# Patient Record
Sex: Male | Born: 1955
Health system: Southern US, Community
[De-identification: ages and names within clinical notes are randomized; demographics above are authoritative.]

## PROBLEM LIST (undated history)

## (undated) DIAGNOSIS — I2510 Atherosclerotic heart disease of native coronary artery without angina pectoris: Secondary | ICD-10-CM

## (undated) DIAGNOSIS — Z0000 Encounter for general adult medical examination without abnormal findings: Secondary | ICD-10-CM

## (undated) DIAGNOSIS — E785 Hyperlipidemia, unspecified: Secondary | ICD-10-CM

## (undated) HISTORY — PX: BACK SURGERY: SHX140

## (undated) HISTORY — DX: Encounter for general adult medical examination without abnormal findings: Z00.00

## (undated) HISTORY — PX: WRIST SURGERY: SHX841

## (undated) HISTORY — DX: Hyperlipidemia, unspecified: E78.5

## (undated) HISTORY — PX: COLONOSCOPY: SHX174

## (undated) HISTORY — DX: Atherosclerotic heart disease of native coronary artery without angina pectoris: I25.10

## (undated) HISTORY — PX: OTHER SURGICAL HISTORY: SHX169

---

## 1999-04-04 ENCOUNTER — Encounter: Admission: RE | Admit: 1999-04-04 | Discharge: 1999-04-04 | Payer: Self-pay | Admitting: Sports Medicine

## 1999-04-13 ENCOUNTER — Encounter: Admission: RE | Admit: 1999-04-13 | Discharge: 1999-04-13 | Payer: Self-pay | Admitting: Family Medicine

## 1999-08-04 ENCOUNTER — Encounter: Admission: RE | Admit: 1999-08-04 | Discharge: 1999-08-04 | Payer: Self-pay | Admitting: Sports Medicine

## 1999-09-08 ENCOUNTER — Encounter: Admission: RE | Admit: 1999-09-08 | Discharge: 1999-09-08 | Payer: Self-pay | Admitting: Family Medicine

## 1999-10-03 ENCOUNTER — Encounter: Admission: RE | Admit: 1999-10-03 | Discharge: 1999-10-03 | Payer: Self-pay | Admitting: Sports Medicine

## 2000-03-23 ENCOUNTER — Emergency Department (HOSPITAL_COMMUNITY): Admission: EM | Admit: 2000-03-23 | Discharge: 2000-03-23 | Payer: Self-pay | Admitting: Emergency Medicine

## 2000-03-26 ENCOUNTER — Encounter: Admission: RE | Admit: 2000-03-26 | Discharge: 2000-03-26 | Payer: Self-pay | Admitting: Family Medicine

## 2000-03-28 ENCOUNTER — Encounter: Admission: RE | Admit: 2000-03-28 | Discharge: 2000-03-28 | Payer: Self-pay | Admitting: Sports Medicine

## 2000-03-28 ENCOUNTER — Encounter: Payer: Self-pay | Admitting: Sports Medicine

## 2000-04-02 ENCOUNTER — Encounter: Admission: RE | Admit: 2000-04-02 | Discharge: 2000-04-02 | Payer: Self-pay | Admitting: Family Medicine

## 2000-04-30 ENCOUNTER — Encounter: Admission: RE | Admit: 2000-04-30 | Discharge: 2000-04-30 | Payer: Self-pay | Admitting: Family Medicine

## 2000-06-04 ENCOUNTER — Encounter: Admission: RE | Admit: 2000-06-04 | Discharge: 2000-06-04 | Payer: Self-pay | Admitting: Sports Medicine

## 2000-09-03 ENCOUNTER — Encounter: Admission: RE | Admit: 2000-09-03 | Discharge: 2000-09-03 | Payer: Self-pay | Admitting: Family Medicine

## 2001-07-16 ENCOUNTER — Encounter: Admission: RE | Admit: 2001-07-16 | Discharge: 2001-07-16 | Payer: Self-pay | Admitting: Sports Medicine

## 2001-08-06 ENCOUNTER — Encounter: Admission: RE | Admit: 2001-08-06 | Discharge: 2001-08-06 | Payer: Self-pay | Admitting: Family Medicine

## 2002-01-02 ENCOUNTER — Encounter: Admission: RE | Admit: 2002-01-02 | Discharge: 2002-01-02 | Payer: Self-pay | Admitting: Family Medicine

## 2003-10-27 ENCOUNTER — Encounter: Admission: RE | Admit: 2003-10-27 | Discharge: 2003-10-27 | Payer: Self-pay | Admitting: Sports Medicine

## 2003-11-13 ENCOUNTER — Encounter: Admission: RE | Admit: 2003-11-13 | Discharge: 2003-11-13 | Payer: Self-pay | Admitting: Sports Medicine

## 2003-12-02 ENCOUNTER — Encounter: Admission: RE | Admit: 2003-12-02 | Discharge: 2003-12-02 | Payer: Self-pay | Admitting: Family Medicine

## 2003-12-16 ENCOUNTER — Encounter: Admission: RE | Admit: 2003-12-16 | Discharge: 2003-12-16 | Payer: Self-pay | Admitting: Family Medicine

## 2003-12-21 ENCOUNTER — Encounter: Admission: RE | Admit: 2003-12-21 | Discharge: 2003-12-21 | Payer: Self-pay | Admitting: Sports Medicine

## 2003-12-25 ENCOUNTER — Encounter: Admission: RE | Admit: 2003-12-25 | Discharge: 2003-12-25 | Payer: Self-pay | Admitting: Family Medicine

## 2004-09-06 ENCOUNTER — Ambulatory Visit: Payer: Self-pay | Admitting: Internal Medicine

## 2004-09-22 ENCOUNTER — Ambulatory Visit: Payer: Self-pay | Admitting: Internal Medicine

## 2004-11-29 ENCOUNTER — Ambulatory Visit: Payer: Self-pay | Admitting: Sports Medicine

## 2005-05-02 ENCOUNTER — Ambulatory Visit: Payer: Self-pay | Admitting: Sports Medicine

## 2005-05-30 ENCOUNTER — Ambulatory Visit: Payer: Self-pay | Admitting: Sports Medicine

## 2005-06-20 ENCOUNTER — Ambulatory Visit: Payer: Self-pay | Admitting: Sports Medicine

## 2005-06-22 ENCOUNTER — Encounter: Admission: RE | Admit: 2005-06-22 | Discharge: 2005-06-22 | Payer: Self-pay | Admitting: Sports Medicine

## 2005-07-11 ENCOUNTER — Encounter: Admission: RE | Admit: 2005-07-11 | Discharge: 2005-07-11 | Payer: Self-pay | Admitting: Sports Medicine

## 2005-07-18 ENCOUNTER — Ambulatory Visit: Payer: Self-pay | Admitting: Sports Medicine

## 2005-07-24 ENCOUNTER — Encounter: Admission: RE | Admit: 2005-07-24 | Discharge: 2005-07-24 | Payer: Self-pay | Admitting: Sports Medicine

## 2005-07-28 ENCOUNTER — Ambulatory Visit (HOSPITAL_COMMUNITY): Admission: RE | Admit: 2005-07-28 | Discharge: 2005-07-29 | Payer: Self-pay | Admitting: Neurological Surgery

## 2008-04-08 ENCOUNTER — Encounter: Admission: RE | Admit: 2008-04-08 | Discharge: 2008-04-08 | Payer: Self-pay | Admitting: Endocrinology

## 2008-09-30 ENCOUNTER — Ambulatory Visit: Payer: Self-pay | Admitting: Sports Medicine

## 2008-09-30 DIAGNOSIS — M7700 Medial epicondylitis, unspecified elbow: Secondary | ICD-10-CM | POA: Insufficient documentation

## 2009-08-04 ENCOUNTER — Encounter (INDEPENDENT_AMBULATORY_CARE_PROVIDER_SITE_OTHER): Payer: Self-pay | Admitting: *Deleted

## 2009-12-07 ENCOUNTER — Ambulatory Visit: Payer: Self-pay | Admitting: Sports Medicine

## 2009-12-07 DIAGNOSIS — M25569 Pain in unspecified knee: Secondary | ICD-10-CM | POA: Insufficient documentation

## 2009-12-07 DIAGNOSIS — M7650 Patellar tendinitis, unspecified knee: Secondary | ICD-10-CM | POA: Insufficient documentation

## 2010-01-03 ENCOUNTER — Ambulatory Visit: Payer: Self-pay | Admitting: Sports Medicine

## 2010-02-28 ENCOUNTER — Telehealth: Payer: Self-pay | Admitting: Internal Medicine

## 2010-03-23 ENCOUNTER — Ambulatory Visit: Payer: Self-pay | Admitting: Sports Medicine

## 2010-08-16 ENCOUNTER — Encounter (INDEPENDENT_AMBULATORY_CARE_PROVIDER_SITE_OTHER): Payer: Self-pay | Admitting: *Deleted

## 2010-09-08 ENCOUNTER — Encounter (INDEPENDENT_AMBULATORY_CARE_PROVIDER_SITE_OTHER): Payer: Self-pay | Admitting: *Deleted

## 2010-09-09 ENCOUNTER — Ambulatory Visit: Payer: Self-pay | Admitting: Internal Medicine

## 2010-09-22 ENCOUNTER — Ambulatory Visit: Payer: Self-pay | Admitting: Internal Medicine

## 2010-11-22 NOTE — Progress Notes (Signed)
Summary: Schedule Colonoscopy  Phone Note Outgoing Call Call back at Home Phone [phone]   Call placed by: Harlow Mares CMA [facility]),  Feb 28, 2010 4:45 PM Call placed to: Patient Summary of Call: patients number has been disconnected , she is due for a colonoscopy, we will mail her another letter.  Initial call taken by: Harlow Mares CMA [facility]),  Feb 28, 2010 4:46 PM

## 2010-11-22 NOTE — Assessment & Plan Note (Signed)
Summary: F/U,MC   Primary Provider:  [facility]   History of Present Illness: Left PT is about 25% less painful than last visit still using NTG no sdieeffects no swelling p playing doubles OK/ no singles not running  doing stationary bike and ellitiptical w no problems  here for reck at 10wks  Allergies: No Known Drug Allergies  Physical Exam  General:  Well-developed,well-nourished,in no acute distress; alert,appropriate and cooperative throughout examination Msk:  RT knee shos prom ib tubercle of prob old osgood schlatter PT is non tender on RT but slt thick at insertion no warm  left PT is thick but closer to same as RT not warm not TTP  bilat other knee exam shows no effusion; stable ligaments; negative Mcmurray's and provocative meniscal tests; non painful patellar compression;  Additional Exam:  MSK [place]  Left PT LOOKS  less hypoechoic total width dec about 15% there is still neovascular activity tear is smaller  RT PT there is a small insertional tear inc doppler activity to this thickened tendon is now sim to Left   Impression & Recommendations:  Problem # 1:  KNEE PAIN (ICD-719.46)  improved but needs to keep working w rehab and w ntg  note there is some mild atrophy of LT VMO compared to RT  Orders: [place] LIMITED ([id number])  Problem # 2:  TENDINITIS, PATELLAR (ICD-726.64)  has partial tears bilat will keep up TX  reck in 2 mos or so  Orders: [place] LIMITED ([id number])  Complete Medication List: 1)  Voltaren 1 % Gel (Diclofenac sodium) .... Apply to right elbow qid as needed pain. dispense qs 1 month 2)  Nitroglycerin 0.2 Mg/hr Pt24 (Nitroglycerin) .... Use /12 patch daily to left patellar tendon  Patient Instructions: 1)  add straight leg lifts w foot turned out 2)  add bent leg lifts with ball between knees 3)  3 sets of 15 with ankle weight 4)  keep up biking 5)  OK to try some easy flat running/ avoid hills 6)  Keep up tennis as  desired 7)  keep up NTG 8)  reck in 2 mos Prescriptions: NITROGLYCERIN 0.2 MG/HR PT24 (NITROGLYCERIN) Use /12 patch daily to left patellar tendon  #1 box x 3   Entered and Authorized by:   Enid Baas MD   Signed by:   Enid Baas MD on 03/23/2010   Method used:   Electronically to        [facility] [phone]* (retail)       [address]       [place], [place]  [address]       Ph: [phone] or [phone]       Fax: [phone]   RxID:   [id number]

## 2010-11-22 NOTE — Miscellaneous (Signed)
Summary: [facility] Previsit/prep  Clinical Lists Changes  Medications: Added new medication of MOVIPREP 100 GM  SOLR (PEG-KCL-NACL-NASULF-NA ASC-C) As per prep instructions. - Signed Rx of MOVIPREP 100 GM  SOLR (PEG-KCL-NACL-NASULF-NA ASC-C) As per prep instructions.;  #1 x 0;  Signed;  Entered by: Wyona Almas RN;  Authorized by: Hilarie Fredrickson MD;  Method used: Electronically to [id number]*, [address], [place], [place], [place]  [address], Ph: [phone] or [phone], Fax: [phone] Observations: Added new observation of NKA: T (09/09/2010 12:52)    Prescriptions: MOVIPREP 100 GM  SOLR (PEG-KCL-NACL-NASULF-NA ASC-C) As per prep instructions.  #1 x 0   Entered by:   Wyona Almas RN   Authorized by:   Hilarie Fredrickson MD   Signed by:   Wyona Almas RN on 09/09/2010   Method used:   Electronically to        [facility] [phone]* (retail)       [address]       [place], [place]  [address]       Ph: [phone] or [phone]       Fax: [phone]   RxID:   [id number]

## 2010-11-22 NOTE — Letter (Signed)
Summary: [facility] Instructions  [facility]  [address] [place], [place] [address]   Phone: [phone]  Fax: [phone]       Mark Fitzgerald    1956/02/04    MRN: [id number]        Procedure Day Dorna Bloom:  Lenor Coffin  09/22/10     Arrival Time:  7:30AM     Procedure Time:  8:00AM     Location of Procedure:                    Juliann Pares  [facility] (4th Floor)                      PREPARATION FOR COLONOSCOPY WITH MOVIPREP   Starting 5 days prior to your procedure 09/17/10 do not eat nuts, seeds, popcorn, corn, beans, peas,  salads, or any raw vegetables.  Do not take any fiber supplements (e.g. Metamucil, Citrucel, and Benefiber).  THE DAY BEFORE YOUR PROCEDURE         DATE: 09/21/10  DAY: WEDNESDAY  1.  Drink clear liquids the entire day-NO SOLID FOOD  2.  Do not drink anything colored red or purple.  Avoid juices with pulp.  No orange juice.  3.  Drink at least 64 oz. (8 glasses) of fluid/clear liquids during the day to prevent dehydration and help the prep work efficiently.  CLEAR LIQUIDS INCLUDE: Water Jello Ice Popsicles Tea (sugar ok, no milk/cream) Powdered fruit flavored drinks Coffee (sugar ok, no milk/cream) Gatorade Juice: apple, white grape, white cranberry  Lemonade Clear bullion, consomm, broth Carbonated beverages (any kind) Strained chicken noodle soup Hard Candy                             4.  In the morning, mix first dose of MoviPrep solution:    Empty 1 Pouch A and 1 Pouch B into the disposable container    Add lukewarm drinking water to the top line of the container. Mix to dissolve    Refrigerate (mixed solution should be used within 24 hrs)  5.  Begin drinking the prep at 5:00 p.m. The MoviPrep container is divided by 4 marks.   Every 15 minutes drink the solution down to the next mark (approximately 8 oz) until the full liter is complete.   6.  Follow completed prep with 16 oz of clear liquid of your choice  (Nothing red or purple).  Continue to drink clear liquids until bedtime.  7.  Before going to bed, mix second dose of MoviPrep solution:    Empty 1 Pouch A and 1 Pouch B into the disposable container    Add lukewarm drinking water to the top line of the container. Mix to dissolve    Refrigerate  THE DAY OF YOUR PROCEDURE      DATE: 09/22/10  DAY: THURSDAY  Beginning at 3:00AM (5 hours before procedure):         1. Every 15 minutes, drink the solution down to the next mark (approx 8 oz) until the full liter is complete.  2. Follow completed prep with 16 oz. of clear liquid of your choice.    3. You may drink clear liquids until 6:00AM (2 HOURS BEFORE PROCEDURE).   MEDICATION INSTRUCTIONS  Unless otherwise instructed, you should take regular prescription medications with a small sip of water   as early as possible the morning of your  procedure.        OTHER INSTRUCTIONS  You will need a responsible adult at least 57 years of age to accompany you and drive you home.   This person must remain in the waiting room during your procedure.  Wear loose fitting clothing that is easily removed.  Leave jewelry and other valuables at home.  However, you may wish to bring a book to read or  an iPod/MP3 player to listen to music as you wait for your procedure to start.  Remove all body piercing jewelry and leave at home.  Total time from sign-in until discharge is approximately 2-3 hours.  You should go home directly after your procedure and rest.  You can resume normal activities the  day after your procedure.  The day of your procedure you should not:   Drive   Make legal decisions   Operate machinery   Drink alcohol   Return to work  You will receive specific instructions about eating, activities and medications before you leave.    The above instructions have been reviewed and explained to me by   Wyona Almas RN  September 09, 2010 1:26 PM     I fully  understand and can verbalize these instructions _____________________________ Date _________

## 2010-11-22 NOTE — Letter (Signed)
Summary: Pre Visit Letter Revised  [facility]  [address] [place], [place] [address]   Phone: [phone]  Fax: [phone]        08/16/2010 MRN: [id number] Mark Fitzgerald [address] [place], [place]  [address]             Procedure Date:  09-22-10   Welcome to the Gastroenterology Division at [facility].    You are scheduled to see a nurse for your pre-procedure visit on 09-08-10 at 9:00a.m. on the 3rd floor at [facility], [address]  We ask that you try to arrive at our office 15 minutes prior to your appointment time to allow for check-in.  Please take a minute to review the attached form.  If you answer "Yes" to one or more of the questions on the first page, we ask that you call the person listed at your earliest opportunity.  If you answer "No" to all of the questions, please complete the rest of the form and bring it to your appointment.    Your nurse visit will consist of discussing your medical and surgical history, your immediate family medical history, and your medications.   If you are unable to list all of your medications on the form, please bring the medication bottles to your appointment and we will list them.  We will need to be aware of both prescribed and over the counter drugs.  We will need to know exact dosage information as well.    Please be prepared to read and sign documents such as consent forms, a financial agreement, and acknowledgement forms.  If necessary, and with your consent, a friend or relative is welcome to sit-in on the nurse visit with you.  Please bring your insurance card so that we may make a copy of it.  If your insurance requires a referral to see a specialist, please bring your referral form from your primary care physician.  No co-pay is required for this nurse visit.     If you cannot keep your appointment, please call [phone] to cancel or reschedule prior to your appointment date.  This allows  [place] the opportunity to schedule an appointment for another patient in need of care.    Thank you for choosing [facility] for your medical needs.  We appreciate the opportunity to care for you.  Please visit [place] at our website  to learn more about our practice.  Sincerely, The Gastroenterology Division

## 2010-11-22 NOTE — Assessment & Plan Note (Signed)
Summary: FU APPT./MJD   Vital Signs:  Patient profile:   55 year old male Height:      71 inches Weight:      158 pounds BMI:     22.12 BP sitting:   118 / 74  Vitals Entered By: Lillia Pauls CMA (January 03, 2010 8:37 AM)  Primary Provider:  Kellye [facility]   History of Present Illness: Mark Fitzgerald feels that pain in left PT is 80% to 90% gone no sideeffects with NTG patch dong exercises daily has been running and playing tennis no swelling  RT side now has about same pain as left and is mild located at inf pat pole  Allergies: No Known Drug Allergies  Physical Exam  General:  Well-developed,well-nourished,in no acute distress; alert,appropriate and cooperative throughout examination Msk:  Bilateral knee exam shows no effusion; stable ligaments; negative Mcmurray's and provocative meniscal tests; non painful patellar compression; patellar and quadriceps tendons unremarkable.  on left there is slight TTP at tip of patellar pole on tendon and with squeeze mild chande of old osgood schlatter  RT is not TTP at pole but has more significant change of OS at tib tubercle   Additional Exam:  MSK [place] Repeat scan today show marked improvement Lt PT neovessels are decreased by about 80% still a separation of tendon at deep prox inf patellar pole/ neovessel is directly in this area on long and trans scan  On RT PT there is a similar small separation at dist pat pole  neovessels noted here as well somce calcification noted note distal tendon calcifications  images saved   Impression & Recommendations:  Problem # 1:  KNEE PAIN (ICD-719.46) Assessment Improved  much less pain not using much for pain at all and dong exerercises reg will add some biking  keep up squat on incline an d SLR  Orders: [place] LIMITED ([id number])  Problem # 2:  TENDINITIS, PATELLAR (ICD-726.64)  improved on scanning and sxs  may try 1/4 patch on RT as well as he has some PT changes noted as  before  reck in 2 mos and look for healing  Orders: [place] LIMITED ([id number])  Complete Medication List: 1)  Voltaren 1 % Gel (Diclofenac sodium) .... Apply to right elbow qid as needed pain. dispense qs 1 month 2)  Nitroglycerin 0.2 Mg/hr Pt24 (Nitroglycerin) .... Use /12 patch daily to left patellar tendon

## 2010-11-22 NOTE — Procedures (Signed)
Summary: Colonoscopy  Patient: [name] Note: All result statuses are Final unless otherwise noted.  Tests: (1) Colonoscopy (COL)   COL Colonoscopy           DONE     [facility]     [address]     [place], [place]  [address]           COLONOSCOPY PROCEDURE REPORT           PATIENT:  Mark Fitzgerald, Mark Fitzgerald  MR#:  [id number]     BIRTHDATE:  [birth year], 54 yrs. old  GENDER:  male     ENDOSCOPIST:  Wilhemina Bonito. Eda Keys, MD     REF. BY:  Surveillance Program Recall     PROCEDURE DATE:  09/22/2010     PROCEDURE:  Surveillance Colonoscopy     ASA CLASS:  Class I     INDICATIONS:  history of polyps, surveillance and high-risk     screening ; index exam 09-2004 w/ acelluar specimen     MEDICATIONS:   Fentanyl 75 mcg IV, Versed 8 mg IV           DESCRIPTION OF PROCEDURE:   After the risks benefits and     alternatives of the procedure were thoroughly explained, informed     consent was obtained.  Digital rectal exam was performed and     revealed no abnormalities.   The LB 180AL [id number] endoscope was     introduced through the anus and advanced to the cecum, which was     identified by both the appendix and ileocecal valve, without     limitations.Time to cecum = 5:04 min.  The quality of the prep was     good, using MoviPrep.  The instrument was then slowly withdrawn     (time = 10:42 min) as the colon was fully examined.     <<PROCEDUREIMAGES>>           FINDINGS:  A normal appearing cecum, ileocecal valve, and     appendiceal orifice were identified. The ascending, hepatic     flexure, transverse, splenic flexure, descending, sigmoid colon,     and rectum appeared unremarkable.  No polyps or cancers were seen.     Retroflexed views in the rectum revealed no abnormalities.    The     scope was then withdrawn from the patient and the procedure     completed.           COMPLICATIONS:  None           ENDOSCOPIC IMPRESSION:     1) Normal colon     2) No polyps or cancers       RECOMMENDATIONS:     1) Continue current colorectal screening recommendations for     "routine risk" patients with a repeat colonoscopy in 10 years.           ______________________________     Wilhemina Bonito. Eda Keys, MD           CC:  Geoffry Paradise, MD; The Patient           n.     eSIGNED:   Wilhemina Bonito. Eda Keys at 09/22/2010 09:37 AM           Francene Boyers, [id number]  Note: An exclamation mark (!) indicates a result that was not dispersed into the flowsheet. Document Creation Date: 09/22/2010 9:37 AM _______________________________________________________________________  (1) Order result status: Final Collection or observation date-time:  09/22/2010 09:30 Requested date-time:  Receipt date-time:  Reported date-time:  Referring Physician:   Ordering Physician: Fransico Setters [phone]) Specimen Source:  Source: Launa Grill Order Number: [id number] Lab site:   Appended Document: Colonoscopy    Clinical Lists Changes  Observations: Added new observation of COLONNXTDUE: 08/2020 (09/22/2010 10:36)

## 2010-11-22 NOTE — Assessment & Plan Note (Signed)
Summary: KNEE PAIN,MC   Vital Signs:  Patient profile:   55 year old male BP sitting:   109 / 75  Vitals Entered By: Lillia Pauls CMA (December 07, 2009 11:26 AM)  Primary Provider:  Laquenta Whitsell [facility]  CC:  knee pain.  History of Present Illness: Pt is a very active runner and tennis player c/o left knee pain for several months. Pain is located over the area of the patellar tendon but also comes across laterally.  He says it hurts mostly with squatting, walking up stairs, or with the pounding of running. For the past several weeks he has just been doing the elliptical at the gym because of the pain in the knee.  Doesn't remember an initial injury.  Allergies: No Known Drug Allergies  Past History:  Past Medical History: Last updated: 12/20/2006 lateral epicondylitis, plantar fasciitis  Social History: Reviewed history from 12/20/2006 and no changes required. non smoker ; etoh - approx 6 beers per week; diet - limits meats; exercise tennis 2 to 3x per week and gym 3x  Review of Systems MS:  Complains of joint pain. Neuro:  Denies numbness, tingling, and weakness.  Physical Exam  Additional Exam:  MSK [place] The left Patelalr tendon shows thickening to 0.58  There is a margianl tear at insertion to lower patella on deep surface There is calcification scattered intro tendon noevessels are 4+ transverse scan shows gaps in tendon filled with neovessels  RT pateallr tendon shows some mild tendinopathy changes width is 0.41 a few neovessels and calcifications are noted small area of tenson separation transo view is more normal  images saved   Knee Exam  General:    Well-developed, well-nourished, normal body habitus; no deformities, normal grooming.  Gait:    Normal heel-toe gait pattern bilaterally.    Motor:    Motor strength 5/5 bilaterally for quadriceps, hamstrings, ankle dorsiflexion, and ankle plantar flexion.    Knee Exam:    Right:    Inspection:   Normal    Palpation:  Normal    Left:    Inspection:  Normal    Palpation:  Abnormal       Location:  patellar    Stability:  stable    Tenderness:  patellar    Swelling:  no    Erythema:  no    Range of Motion:       Flexion-Active: full       Extension-Active: full       Flexion-Passive: full       Extension-Passive: full  Special tests:    No evidence of ligamentous laxity or hamstring tightness.   Patellar mobility:    Left normal Patellofemoral joint crepitus:    Left positive Lachman :    Left negative MCL:    Left negative LCL:    Left negative  significant tenderness over the patellar tendon with stepping up onto a step as well as with squatting, unable to perform duck walk due to pain   Impression & Recommendations:  Problem # 1:  TENDINITIS, PATELLAR (ICD-726.64)  tendonopathy of the left patellar tendon u/s done in office showing abnormalities within the tendon as well as several neo-vessels right knee also had mild patellar tendonopathy  Of Note the ultrasound images that are labeled Rt PT are actually Left except for the last image  Orders: US EXTREMITY NON-VASC REAL-TIME IMG ([address])  Problem # 2:  KNEE PAIN (ICD-719.46)  Orders: Garment,belt,sleeve or other covering ,elastic or similar stretch (  Z6109) US EXTREMITY NON-VASC REAL-TIME IMG [phone])   will begin him on a slingshot patellar strap also start on NTG patches - 1/4 patch of .2mg  per hour dose and maybe later increase to half  reck 1 month follow US exams  easy knee rehab advised - eccentric and biking, SLR nothing with resistance OK to exercise pending pain level of 3 or less  Complete Medication List: 1)  Voltaren 1 % Gel (Diclofenac sodium) .... Apply to right elbow qid as needed pain. dispense qs 1 month 2)  Nitroglycerin 0.2 Mg/hr Pt24 (Nitroglycerin) .... Use /12 patch daily to left patellar tendon Prescriptions: NITROGLYCERIN 0.2 MG/HR PT24 (NITROGLYCERIN) Use /12 patch  daily to left patellar tendon  #1 box x 3   Entered and Authorized by:   Enid Baas MD   Signed by:   Enid Baas MD on 12/07/2009   Method used:   Electronically to        [facility] [phone]* (retail)       [address]       [place], [place]  [address]       Ph: [phone] or [phone]       Fax: [phone]   RxID:   [id number]

## 2011-03-10 NOTE — Op Note (Signed)
Mark Fitzgerald, Mark Fitzgerald                ACCOUNT NO.:  [id number]   MEDICAL RECORD NO.:  [id number]          PATIENT TYPE:  AMB   LOCATION:  SDS                          FACILITY:  [facility]   PHYSICIAN:  Stefani Dama, M.D.  DATE OF BIRTH:  1956-06-22   DATE OF PROCEDURE:  07/28/2005  DATE OF DISCHARGE:                                 OPERATIVE REPORT   PREOPERATIVE DIAGNOSIS:  Herniated nucleus pulposus, L4-5 right, with right  lumbar radiculopathy.   POSTOPERATIVE DIAGNOSIS:  Herniated nucleus pulposus, L4-5 right, with right  lumbar radiculopathy.   PROCEDURE:  Right L4-5 METRx diskectomy with operating microscope,  microdiskectomy, microdissection technique.   SURGEON:  Stefani Dama, M.D.   FIRST ASSISTANT:  Danae Orleans. Venetia Maxon, M.D.   ANESTHESIA:  General endotracheal.   INDICATIONS:  Mark Fitzgerald is a 55 year old individual who has had significant  back and right lower extremity pain.  He has evidence of an extruded  fragment of disk on the right side at L4-5.  He is taken to the operating  room.   PROCEDURE:  The patient was brought to the operating room supine on a  stretcher.  After the smooth induction of general endotracheal anesthesia,  he was turned prone and the back was prepped with DuraPrep and draped in  sterile fashion.  The hair was clipped on the back.  A fluoroscopic  localization was obtained at L4-L5 on the right side.  The skin above this  area was infiltrated with 30 mL of lidocaine with epinephrine 1% with 50-50  with 9.5% Marcaine.  A K-wire was passed down to the laminar arch of L4.  Then using a using a small incision overlying this area, a wanding technique  was used to pass a series of dilators out to the 18 mm diameter.  An 18 mm  diameter x 5 cm deep endoscopic cannula was fixed rigidly to the operating  table.  The microscope was draped and brought into the field.  Through this  aperture then the soft tissues above the laminar arch of L4 were cleared.   A  laminotomy removing the inferior margin lamina of L4 out to the mesial wall  of the facet was performed, yellow ligament was taken up and the underlying  common dural tube was identified.  Dissection was carried out, performing a  mesial facetectomy and this allowed to explore the lateral aspect of the  dural tube.  The L5 nerve root was identified as it came off of the common  dural tube and in the axilla of the nerve root, there was noted to be a  significant mass just underlying this.  Epidural veins in this area were  cauterized, divided with microdissection technique and then by gradually  manipulating the tissue in this area, a fragment of the disk was identified.  This was gently teased out with a small micropituitary rongeur and with  gentle traction, a large singular fragment was removed.  Further exploration  revealed two other smaller fragments.  This allowed for good and immediate  decompression of the common dural tube and  the L5 nerve root.  With this,  hemostasis was readily obtained with bipolar cautery and some small pledgets  of Gelfoam, which were later irrigated away.  No further dissection in and  around the disk space was performed as the disk space itself seemed closed  with a ligamentous closure over the disk space itself.  The area was then again copiously irrigated with antibiotic irrigating  solution.  Toradol 15 mg was left in the epidural space.  The microscope was  withdrawn.  The endoscopic cannula was removed.  A singular 3-0 Vicryl  suture was placed in the lumbar dorsal fascia and then the subcutaneous  tissue and the skin was closed with 3-0 Vicryl in interrupted fashion.  Dermabond was placed on the skin.  The patient tolerated procedure well and  was returned to the recovery room in stable condition.      Stefani Dama, M.D.  Electronically Signed     HJE/MEDQ  D:  07/28/2005  T:  07/29/2005  Job:  [id number]

## 2013-07-18 ENCOUNTER — Ambulatory Visit (INDEPENDENT_AMBULATORY_CARE_PROVIDER_SITE_OTHER): Payer: BC Managed Care – PPO | Admitting: Family Medicine

## 2013-07-18 VITALS — BP 113/75 | Ht 71.0 in | Wt 165.0 lb

## 2013-07-18 DIAGNOSIS — M545 Low back pain, unspecified: Secondary | ICD-10-CM

## 2013-07-18 DIAGNOSIS — M6281 Muscle weakness (generalized): Secondary | ICD-10-CM

## 2013-07-18 NOTE — Patient Instructions (Signed)
Prior Berkley Harvey number is [id number]

## 2013-07-21 DIAGNOSIS — M6281 Muscle weakness (generalized): Secondary | ICD-10-CM | POA: Insufficient documentation

## 2013-07-21 NOTE — Progress Notes (Signed)
  Subjective:    Patient ID: Mark Fitzgerald, male    DOB: 1956/05/16, 57 y.o.   MRN: [id number]  HPI Several weeks of left lower strandy weakness with some radiating pain. Has had herniated disc for this feels similar to it. No bowel or bladder incontinence.   Review of Systems No unusual weight change, no fever, sweats, chills.    Objective:   Physical Exam  Vital signs are reviewed GENERAL: Well-developed male no acute distress BACK: Nontender to palpation. No defect. Full range of motion at hips including flexion and hyperextension. EXTREMITY: Strength. Left hip flexion extension, knee flexion extension and abduction is 4/5 compared with the right which is 5 out of 5. Dorsiflexion plantar flexion 4/5. DTRs 1-2+ at the knee on the left and 2+ knee on the right. He has intact sensation to soft touch distally. He is normal 2+ bilaterally equal dorsalis pedis pulses. IMAGING: No recent imaging. I looked at his MRI of his LS-spine prior to his surgery he had quite a large eccentric right-sided disc.      Assessment & Plan:  Left lower extremity weakness. Concern for H&P. MRI.

## 2013-07-23 ENCOUNTER — Ambulatory Visit (HOSPITAL_COMMUNITY)
Admission: RE | Admit: 2013-07-23 | Discharge: 2013-07-23 | Disposition: A | Discharge status: Home / Self Care | Payer: BC Managed Care – PPO | Source: Ambulatory Visit | Attending: Family Medicine | Admitting: Family Medicine

## 2013-07-23 DIAGNOSIS — M51379 Other intervertebral disc degeneration, lumbosacral region without mention of lumbar back pain or lower extremity pain: Secondary | ICD-10-CM | POA: Insufficient documentation

## 2013-07-23 DIAGNOSIS — M545 Low back pain, unspecified: Secondary | ICD-10-CM | POA: Insufficient documentation

## 2013-07-23 DIAGNOSIS — M5137 Other intervertebral disc degeneration, lumbosacral region: Secondary | ICD-10-CM | POA: Insufficient documentation

## 2013-07-24 ENCOUNTER — Telehealth: Payer: Self-pay | Admitting: Family Medicine

## 2013-07-24 ENCOUNTER — Other Ambulatory Visit: Payer: BC Managed Care – PPO

## 2013-07-24 NOTE — Telephone Encounter (Signed)
Amy or [place] plz call and tell him no new HUGE disc bulge but a general worsening of all of the arthritis stuff and one place where he may have some acute inflammatory reactions around the outside of a disc. i think he probably should should see a neurosurgeon again at some point for an eval. Given his wekaness, I might try him on a second longer steroid burst. If he wants that call in taper of 5 days each of 60 then 50 then 40 then 10 mg a day THANKS! Denny Levy

## 2013-07-25 ENCOUNTER — Other Ambulatory Visit: Payer: Self-pay | Admitting: *Deleted

## 2013-07-25 ENCOUNTER — Other Ambulatory Visit: Payer: Self-pay | Admitting: Family Medicine

## 2013-07-25 DIAGNOSIS — M6281 Muscle weakness (generalized): Secondary | ICD-10-CM

## 2013-07-25 DIAGNOSIS — M549 Dorsalgia, unspecified: Secondary | ICD-10-CM

## 2013-07-25 DIAGNOSIS — M545 Low back pain, unspecified: Secondary | ICD-10-CM

## 2013-07-30 ENCOUNTER — Ambulatory Visit
Admission: RE | Admit: 2013-07-30 | Discharge: 2013-07-30 | Disposition: A | Discharge status: Home / Self Care | Payer: BC Managed Care – PPO | Source: Ambulatory Visit | Attending: Family Medicine | Admitting: Family Medicine

## 2013-07-30 ENCOUNTER — Other Ambulatory Visit: Payer: Self-pay | Admitting: Family Medicine

## 2013-07-30 DIAGNOSIS — M549 Dorsalgia, unspecified: Secondary | ICD-10-CM

## 2013-07-30 MED ORDER — IOHEXOL 180 MG/ML  SOLN
1.0000 mL | Freq: Once | INTRAMUSCULAR | Status: AC | PRN
  Administered 2013-07-30: 1 mL via EPIDURAL

## 2013-07-30 MED ORDER — METHYLPREDNISOLONE ACETATE 40 MG/ML INJ SUSP (RADIOLOG
120.0000 mg | Freq: Once | INTRAMUSCULAR | Status: AC
  Administered 2013-07-30: 120 mg via EPIDURAL

## 2013-09-30 ENCOUNTER — Encounter: Payer: Self-pay | Admitting: Sports Medicine

## 2013-09-30 ENCOUNTER — Ambulatory Visit (INDEPENDENT_AMBULATORY_CARE_PROVIDER_SITE_OTHER): Payer: BC Managed Care – PPO | Admitting: Sports Medicine

## 2013-09-30 ENCOUNTER — Encounter (INDEPENDENT_AMBULATORY_CARE_PROVIDER_SITE_OTHER): Payer: Self-pay

## 2013-09-30 VITALS — BP 106/65 | Ht 71.0 in | Wt 165.0 lb

## 2013-09-30 DIAGNOSIS — M51369 Other intervertebral disc degeneration, lumbar region without mention of lumbar back pain or lower extremity pain: Secondary | ICD-10-CM | POA: Insufficient documentation

## 2013-09-30 DIAGNOSIS — R29898 Other symptoms and signs involving the musculoskeletal system: Secondary | ICD-10-CM

## 2013-09-30 DIAGNOSIS — M5136 Other intervertebral disc degeneration, lumbar region: Secondary | ICD-10-CM | POA: Insufficient documentation

## 2013-09-30 DIAGNOSIS — M5137 Other intervertebral disc degeneration, lumbosacral region: Secondary | ICD-10-CM

## 2013-09-30 DIAGNOSIS — M6281 Muscle weakness (generalized): Secondary | ICD-10-CM

## 2013-09-30 MED ORDER — GABAPENTIN 600 MG PO TABS: ORAL_TABLET | ORAL | Status: DC

## 2013-09-30 MED ORDER — TRAMADOL HCL 50 MG PO TABS: 50.0000 mg | ORAL_TABLET | Freq: Two times a day (BID) | ORAL | Status: DC

## 2013-09-30 NOTE — Progress Notes (Signed)
   Subjective:    Patient ID: Mark Fitzgerald, male    DOB: Jun 19, 1956, 57 y.o.   MRN: [id number]  HPI 57 year old gentlemen presents for evaluation of back/hip pain.  He has had prior lumbar surgery (L4-L5).  He has recently seen Dr. Jennette Kettle for this on 9/26.  Given weakness on PE, patient was sent for MRI.  MRI revealed worsening disc degeneration but no bulging discs or evidence of nerve impingement.  Patient received epidural steroid injection on 10/8.    Following injection, he notice some relief in his pain but this only lasted a few weeks.  Pain then returned.  Pain is sharp and goes from his left buttock down his left leg.  He denies any back pain.   Additionally, he does not some left lower extremity weakness.  No numbness or tingling.  No bladder or bowel incontinence.   Review of Systems Per HPI    Objective:   Physical Exam Filed Vitals:   09/30/13 1016  BP: 106/65  General: well appearing gentleman in NAD. MSK: Back  Slightly tender to palpation of the lumbar spine.  Decreased ROM in extension and flexion. These create pain. Muscle strength:  Knee extension and flexion 5/5. Plantar/dorsiflexion decreased slightly on the left. Difficulty with walking on heels (L5-S1) and with walking on toes (L4-L5). Normal patellar and achilles reflexes.   Hip: Left ROM Normal  ROM  FABER with decreased external rotation on the left. No tenderness over piriformis and greater trochanter. No pain with FABER or FADIR. No SI joint tenderness. SI tightness noted on the left. Weakness with testing of gluteus medius and maximus. Weakness with Abduction but not flexion also noted.   Assessment & Plan:  See Problem List

## 2013-09-30 NOTE — Assessment & Plan Note (Signed)
Note this affects gait with left leg prob 2/2 his LB issues and nerve impingment

## 2013-09-30 NOTE — Assessment & Plan Note (Signed)
Patient with weakness in abduction, extension. Gave strengthening exercises today.

## 2013-09-30 NOTE — Assessment & Plan Note (Signed)
MRI reviewed.  Patient has decreased disc space and disc bulging at L4-L5.  Significant bulging also noted at L5-S1.  Cystic changes also noted. Patient given lower back exercises today. Tramadol and Gabapentin for pain. Follow up in 1 month.

## 2013-09-30 NOTE — Patient Instructions (Signed)
It was nice to see you today.  Please do the highlighted exercises daily.  I have prescribed 2 medications for you. Please take as prescribed.

## 2013-10-30 ENCOUNTER — Ambulatory Visit (INDEPENDENT_AMBULATORY_CARE_PROVIDER_SITE_OTHER): Payer: BC Managed Care – PPO | Admitting: Sports Medicine

## 2013-10-30 ENCOUNTER — Encounter: Payer: Self-pay | Admitting: Sports Medicine

## 2013-10-30 VITALS — BP 118/81 | HR 88 | Ht 71.0 in | Wt 165.0 lb

## 2013-10-30 DIAGNOSIS — M5136 Other intervertebral disc degeneration, lumbar region: Secondary | ICD-10-CM

## 2013-10-30 DIAGNOSIS — M5137 Other intervertebral disc degeneration, lumbosacral region: Secondary | ICD-10-CM

## 2013-10-30 MED ORDER — TRAMADOL HCL 50 MG PO TABS: 50.0000 mg | ORAL_TABLET | Freq: Four times a day (QID) | ORAL | Status: DC | PRN

## 2013-10-30 MED ORDER — DIAZEPAM 5 MG PO TABS: ORAL_TABLET | ORAL | Status: DC

## 2013-10-30 NOTE — Progress Notes (Signed)
Patient ID: Mark Fitzgerald, male   DOB: June 12, 1956, 58 y.o.   MRN: [id number]  Patient returns for followup of lumbar degenerative disc disease with radiculopathy down his left leg Gabapentin gave him depressive feelings and he could not tolerate this Tramadol has had very little effect on the burning or pain Exercises for the low back do seem to help  left leg does not feel as weak as it did  In 2006 he had a lumbar surgery at L4-5 by Dr. Ellene Route Recent MRI showed bulging at L4-5 and degenerative change Significant disc bulging at L5-S1   Epidural steroid was not helpful except for 3 weeks  Physical examination  Physically fit and in no acute distress  He can do heel and toe walk as well as walking on the outside of his feet 10 heel raises are easier on the right than the left but he is able to do them on the left without any sign of weakness Hip abduction weakness noted before has resolved and he has good hip strength overall Quadriceps and hamstring strength is good  Directional testing of foot strength in plantar flexion, dorsiflexion eversion and inversion was all strong  He gets tightness in radicular symptoms on straight leg raise on the left but only at 60

## 2013-10-30 NOTE — Assessment & Plan Note (Signed)
He has no significant weakness we will continue with conservative care  He is doing a good job with a home exercise program so we will continue that  Increase tramadol to 4 times a day  Trial of diazepam approximately 5 mg at night to see if we can get some relief of muscle spasm  Recheck 2 months

## 2013-11-14 ENCOUNTER — Other Ambulatory Visit: Payer: Self-pay | Admitting: Internal Medicine

## 2013-11-14 DIAGNOSIS — R7989 Other specified abnormal findings of blood chemistry: Secondary | ICD-10-CM

## 2013-11-14 DIAGNOSIS — R945 Abnormal results of liver function studies: Principal | ICD-10-CM

## 2013-11-18 ENCOUNTER — Ambulatory Visit
Admission: RE | Admit: 2013-11-18 | Discharge: 2013-11-18 | Disposition: A | Discharge status: Home / Self Care | Payer: BC Managed Care – PPO | Source: Ambulatory Visit | Attending: Internal Medicine | Admitting: Internal Medicine

## 2013-11-18 DIAGNOSIS — R945 Abnormal results of liver function studies: Principal | ICD-10-CM

## 2013-11-18 DIAGNOSIS — R7989 Other specified abnormal findings of blood chemistry: Secondary | ICD-10-CM

## 2014-02-10 ENCOUNTER — Other Ambulatory Visit: Payer: Self-pay | Admitting: *Deleted

## 2014-02-10 MED ORDER — TRAMADOL HCL 50 MG PO TABS
50.0000 mg | ORAL_TABLET | Freq: Four times a day (QID) | ORAL | Status: DC | PRN
Start: 2014-02-10 — End: 2020-09-22

## 2015-04-15 ENCOUNTER — Encounter: Payer: Self-pay | Admitting: Internal Medicine

## 2016-11-16 DIAGNOSIS — Z0000 Encounter for general adult medical examination without abnormal findings: Secondary | ICD-10-CM | POA: Diagnosis not present

## 2016-11-16 DIAGNOSIS — Z125 Encounter for screening for malignant neoplasm of prostate: Secondary | ICD-10-CM | POA: Diagnosis not present

## 2016-11-22 DIAGNOSIS — Z1389 Encounter for screening for other disorder: Secondary | ICD-10-CM | POA: Diagnosis not present

## 2016-11-22 DIAGNOSIS — J45909 Unspecified asthma, uncomplicated: Secondary | ICD-10-CM | POA: Diagnosis not present

## 2016-11-22 DIAGNOSIS — M545 Low back pain: Secondary | ICD-10-CM | POA: Diagnosis not present

## 2016-11-22 DIAGNOSIS — Z0000 Encounter for general adult medical examination without abnormal findings: Secondary | ICD-10-CM | POA: Diagnosis not present

## 2016-11-22 DIAGNOSIS — M1990 Unspecified osteoarthritis, unspecified site: Secondary | ICD-10-CM | POA: Diagnosis not present

## 2016-11-27 DIAGNOSIS — Z1212 Encounter for screening for malignant neoplasm of rectum: Secondary | ICD-10-CM | POA: Diagnosis not present

## 2017-01-11 DIAGNOSIS — L812 Freckles: Secondary | ICD-10-CM | POA: Diagnosis not present

## 2017-01-11 DIAGNOSIS — L308 Other specified dermatitis: Secondary | ICD-10-CM | POA: Diagnosis not present

## 2017-01-11 DIAGNOSIS — L821 Other seborrheic keratosis: Secondary | ICD-10-CM | POA: Diagnosis not present

## 2017-11-21 DIAGNOSIS — R8290 Unspecified abnormal findings in urine: Secondary | ICD-10-CM | POA: Diagnosis not present

## 2017-11-21 DIAGNOSIS — Z0000 Encounter for general adult medical examination without abnormal findings: Secondary | ICD-10-CM | POA: Diagnosis not present

## 2017-11-28 DIAGNOSIS — Z1212 Encounter for screening for malignant neoplasm of rectum: Secondary | ICD-10-CM | POA: Diagnosis not present

## 2017-11-28 DIAGNOSIS — J029 Acute pharyngitis, unspecified: Secondary | ICD-10-CM | POA: Diagnosis not present

## 2017-11-28 DIAGNOSIS — Z0000 Encounter for general adult medical examination without abnormal findings: Secondary | ICD-10-CM | POA: Diagnosis not present

## 2018-01-18 DIAGNOSIS — D2261 Melanocytic nevi of right upper limb, including shoulder: Secondary | ICD-10-CM | POA: Diagnosis not present

## 2018-01-18 DIAGNOSIS — D225 Melanocytic nevi of trunk: Secondary | ICD-10-CM | POA: Diagnosis not present

## 2018-01-18 DIAGNOSIS — L821 Other seborrheic keratosis: Secondary | ICD-10-CM | POA: Diagnosis not present

## 2018-01-18 DIAGNOSIS — L820 Inflamed seborrheic keratosis: Secondary | ICD-10-CM | POA: Diagnosis not present

## 2018-05-15 DIAGNOSIS — Z23 Encounter for immunization: Secondary | ICD-10-CM | POA: Diagnosis not present

## 2018-06-26 DIAGNOSIS — N401 Enlarged prostate with lower urinary tract symptoms: Secondary | ICD-10-CM | POA: Diagnosis not present

## 2018-06-26 DIAGNOSIS — R351 Nocturia: Secondary | ICD-10-CM | POA: Diagnosis not present

## 2018-07-17 DIAGNOSIS — R351 Nocturia: Secondary | ICD-10-CM | POA: Diagnosis not present

## 2018-07-17 DIAGNOSIS — N401 Enlarged prostate with lower urinary tract symptoms: Secondary | ICD-10-CM | POA: Diagnosis not present

## 2018-11-26 DIAGNOSIS — R82998 Other abnormal findings in urine: Secondary | ICD-10-CM | POA: Diagnosis not present

## 2018-11-26 DIAGNOSIS — Z0000 Encounter for general adult medical examination without abnormal findings: Secondary | ICD-10-CM | POA: Diagnosis not present

## 2018-11-29 DIAGNOSIS — Z1212 Encounter for screening for malignant neoplasm of rectum: Secondary | ICD-10-CM | POA: Diagnosis not present

## 2018-12-04 DIAGNOSIS — Z0000 Encounter for general adult medical examination without abnormal findings: Secondary | ICD-10-CM | POA: Diagnosis not present

## 2018-12-04 DIAGNOSIS — M545 Low back pain: Secondary | ICD-10-CM | POA: Diagnosis not present

## 2018-12-04 DIAGNOSIS — M1990 Unspecified osteoarthritis, unspecified site: Secondary | ICD-10-CM | POA: Diagnosis not present

## 2018-12-04 DIAGNOSIS — N401 Enlarged prostate with lower urinary tract symptoms: Secondary | ICD-10-CM | POA: Diagnosis not present

## 2018-12-04 DIAGNOSIS — Z125 Encounter for screening for malignant neoplasm of prostate: Secondary | ICD-10-CM | POA: Diagnosis not present

## 2018-12-04 DIAGNOSIS — Z23 Encounter for immunization: Secondary | ICD-10-CM | POA: Diagnosis not present

## 2019-07-30 DIAGNOSIS — H5203 Hypermetropia, bilateral: Secondary | ICD-10-CM | POA: Diagnosis not present

## 2019-08-08 DIAGNOSIS — Z23 Encounter for immunization: Secondary | ICD-10-CM | POA: Diagnosis not present

## 2019-09-11 ENCOUNTER — Other Ambulatory Visit: Payer: Self-pay

## 2019-09-11 DIAGNOSIS — Z20822 Contact with and (suspected) exposure to covid-19: Secondary | ICD-10-CM

## 2019-09-14 LAB — NOVEL CORONAVIRUS, NAA: SARS-CoV-2, NAA: NOT DETECTED

## 2019-11-17 ENCOUNTER — Ambulatory Visit: Payer: Self-pay | Attending: Internal Medicine

## 2019-11-17 DIAGNOSIS — Z23 Encounter for immunization: Secondary | ICD-10-CM | POA: Insufficient documentation

## 2019-11-17 NOTE — Progress Notes (Signed)
   Covid-19 Vaccination Clinic  Name:  Mark Fitzgerald    MRN: [id number] DOB: 09-22-1956  11/17/2019  [name] was observed post Covid-19 immunization for 15 minutes without incidence. He was provided with Vaccine Information Sheet and instruction to access the V-Safe system.   [name] was instructed to call 911 with any severe reactions post vaccine: Marland Kitchen Difficulty breathing  . Swelling of your face and throat  . A fast heartbeat  . A bad rash all over your body  . Dizziness and weakness    Immunizations Administered    Name Date Dose VIS Date Route   Pfizer COVID-19 Vaccine 11/17/2019  8:44 AM 0.3 mL 10/03/2019 Intramuscular   Manufacturer: [place]   Lot: [id number]   [place]: [id number]

## 2019-11-20 ENCOUNTER — Ambulatory Visit: Payer: Self-pay

## 2019-12-01 ENCOUNTER — Ambulatory Visit: Payer: Self-pay

## 2019-12-03 DIAGNOSIS — Z0000 Encounter for general adult medical examination without abnormal findings: Secondary | ICD-10-CM | POA: Diagnosis not present

## 2019-12-03 DIAGNOSIS — Z125 Encounter for screening for malignant neoplasm of prostate: Secondary | ICD-10-CM | POA: Diagnosis not present

## 2019-12-03 DIAGNOSIS — R7989 Other specified abnormal findings of blood chemistry: Secondary | ICD-10-CM | POA: Diagnosis not present

## 2019-12-08 ENCOUNTER — Ambulatory Visit: Payer: Self-pay | Attending: Internal Medicine

## 2019-12-08 DIAGNOSIS — Z23 Encounter for immunization: Secondary | ICD-10-CM | POA: Insufficient documentation

## 2019-12-08 NOTE — Progress Notes (Signed)
   Covid-19 Vaccination Clinic  Name:  Mark Fitzgerald    MRN: [id number] DOB: May 28, 1956  12/08/2019  Mark Fitzgerald was observed post Covid-19 immunization for 15 minutes without incidence. He was provided with Vaccine Information Sheet and instruction to access the V-Safe system.   Mark Fitzgerald was instructed to call 911 with any severe reactions post vaccine: Marland Kitchen Difficulty breathing  . Swelling of your face and throat  . A fast heartbeat  . A bad rash all over your body  . Dizziness and weakness    Immunizations Administered    Name Date Dose VIS Date Route   Pfizer COVID-19 Vaccine 12/08/2019  9:08 AM 0.3 mL 10/03/2019 Intramuscular   Manufacturer: [place]   Lot: [id number]   [place]: [id number]

## 2019-12-10 DIAGNOSIS — Z0000 Encounter for general adult medical examination without abnormal findings: Secondary | ICD-10-CM | POA: Diagnosis not present

## 2019-12-10 DIAGNOSIS — J45909 Unspecified asthma, uncomplicated: Secondary | ICD-10-CM | POA: Diagnosis not present

## 2019-12-10 DIAGNOSIS — M1990 Unspecified osteoarthritis, unspecified site: Secondary | ICD-10-CM | POA: Diagnosis not present

## 2019-12-10 DIAGNOSIS — N401 Enlarged prostate with lower urinary tract symptoms: Secondary | ICD-10-CM | POA: Diagnosis not present

## 2019-12-21 ENCOUNTER — Ambulatory Visit: Payer: Self-pay

## 2020-01-09 DIAGNOSIS — N401 Enlarged prostate with lower urinary tract symptoms: Secondary | ICD-10-CM | POA: Diagnosis not present

## 2020-01-09 DIAGNOSIS — R351 Nocturia: Secondary | ICD-10-CM | POA: Diagnosis not present

## 2020-02-25 DIAGNOSIS — M25511 Pain in right shoulder: Secondary | ICD-10-CM | POA: Diagnosis not present

## 2020-03-09 DIAGNOSIS — M25511 Pain in right shoulder: Secondary | ICD-10-CM | POA: Diagnosis not present

## 2020-03-16 DIAGNOSIS — M24111 Other articular cartilage disorders, right shoulder: Secondary | ICD-10-CM | POA: Diagnosis not present

## 2020-03-16 DIAGNOSIS — M7521 Bicipital tendinitis, right shoulder: Secondary | ICD-10-CM | POA: Diagnosis not present

## 2020-03-16 DIAGNOSIS — M25511 Pain in right shoulder: Secondary | ICD-10-CM | POA: Diagnosis not present

## 2020-03-16 DIAGNOSIS — M19011 Primary osteoarthritis, right shoulder: Secondary | ICD-10-CM | POA: Diagnosis not present

## 2020-03-16 DIAGNOSIS — S43491A Other sprain of right shoulder joint, initial encounter: Secondary | ICD-10-CM | POA: Diagnosis not present

## 2020-03-16 DIAGNOSIS — G8918 Other acute postprocedural pain: Secondary | ICD-10-CM | POA: Diagnosis not present

## 2020-03-16 DIAGNOSIS — Z4889 Encounter for other specified surgical aftercare: Secondary | ICD-10-CM | POA: Diagnosis not present

## 2020-03-16 DIAGNOSIS — M7541 Impingement syndrome of right shoulder: Secondary | ICD-10-CM | POA: Diagnosis not present

## 2020-03-16 DIAGNOSIS — R600 Localized edema: Secondary | ICD-10-CM | POA: Diagnosis not present

## 2020-03-25 DIAGNOSIS — M25511 Pain in right shoulder: Secondary | ICD-10-CM | POA: Diagnosis not present

## 2020-04-02 DIAGNOSIS — M25511 Pain in right shoulder: Secondary | ICD-10-CM | POA: Diagnosis not present

## 2020-04-08 DIAGNOSIS — M25511 Pain in right shoulder: Secondary | ICD-10-CM | POA: Diagnosis not present

## 2020-04-15 DIAGNOSIS — M25511 Pain in right shoulder: Secondary | ICD-10-CM | POA: Diagnosis not present

## 2020-04-22 DIAGNOSIS — M25511 Pain in right shoulder: Secondary | ICD-10-CM | POA: Diagnosis not present

## 2020-04-29 DIAGNOSIS — M25511 Pain in right shoulder: Secondary | ICD-10-CM | POA: Diagnosis not present

## 2020-04-29 DIAGNOSIS — Z125 Encounter for screening for malignant neoplasm of prostate: Secondary | ICD-10-CM | POA: Diagnosis not present

## 2020-04-29 DIAGNOSIS — N401 Enlarged prostate with lower urinary tract symptoms: Secondary | ICD-10-CM | POA: Diagnosis not present

## 2020-04-29 DIAGNOSIS — R351 Nocturia: Secondary | ICD-10-CM | POA: Diagnosis not present

## 2020-05-04 DIAGNOSIS — L723 Sebaceous cyst: Secondary | ICD-10-CM | POA: Diagnosis not present

## 2020-05-06 DIAGNOSIS — M25511 Pain in right shoulder: Secondary | ICD-10-CM | POA: Diagnosis not present

## 2020-05-11 DIAGNOSIS — H0011 Chalazion right upper eyelid: Secondary | ICD-10-CM | POA: Diagnosis not present

## 2020-05-20 DIAGNOSIS — M25511 Pain in right shoulder: Secondary | ICD-10-CM | POA: Diagnosis not present

## 2020-06-07 DIAGNOSIS — M25511 Pain in right shoulder: Secondary | ICD-10-CM | POA: Diagnosis not present

## 2020-06-11 DIAGNOSIS — R351 Nocturia: Secondary | ICD-10-CM | POA: Diagnosis not present

## 2020-06-11 DIAGNOSIS — N401 Enlarged prostate with lower urinary tract symptoms: Secondary | ICD-10-CM | POA: Diagnosis not present

## 2020-06-11 DIAGNOSIS — R350 Frequency of micturition: Secondary | ICD-10-CM | POA: Diagnosis not present

## 2020-06-17 DIAGNOSIS — M25511 Pain in right shoulder: Secondary | ICD-10-CM | POA: Diagnosis not present

## 2020-06-21 DIAGNOSIS — M25511 Pain in right shoulder: Secondary | ICD-10-CM | POA: Diagnosis not present

## 2020-06-22 DIAGNOSIS — M6281 Muscle weakness (generalized): Secondary | ICD-10-CM | POA: Diagnosis not present

## 2020-06-22 DIAGNOSIS — L308 Other specified dermatitis: Secondary | ICD-10-CM | POA: Diagnosis not present

## 2020-06-22 DIAGNOSIS — L821 Other seborrheic keratosis: Secondary | ICD-10-CM | POA: Diagnosis not present

## 2020-06-22 DIAGNOSIS — L812 Freckles: Secondary | ICD-10-CM | POA: Diagnosis not present

## 2020-06-22 DIAGNOSIS — L813 Cafe au lait spots: Secondary | ICD-10-CM | POA: Diagnosis not present

## 2020-06-22 DIAGNOSIS — M6289 Other specified disorders of muscle: Secondary | ICD-10-CM | POA: Diagnosis not present

## 2020-06-22 DIAGNOSIS — M62838 Other muscle spasm: Secondary | ICD-10-CM | POA: Diagnosis not present

## 2020-07-08 DIAGNOSIS — M6281 Muscle weakness (generalized): Secondary | ICD-10-CM | POA: Diagnosis not present

## 2020-07-08 DIAGNOSIS — R3915 Urgency of urination: Secondary | ICD-10-CM | POA: Diagnosis not present

## 2020-07-08 DIAGNOSIS — M62838 Other muscle spasm: Secondary | ICD-10-CM | POA: Diagnosis not present

## 2020-07-08 DIAGNOSIS — R350 Frequency of micturition: Secondary | ICD-10-CM | POA: Diagnosis not present

## 2020-07-08 DIAGNOSIS — M6289 Other specified disorders of muscle: Secondary | ICD-10-CM | POA: Diagnosis not present

## 2020-07-08 DIAGNOSIS — R351 Nocturia: Secondary | ICD-10-CM | POA: Diagnosis not present

## 2020-09-21 NOTE — Progress Notes (Signed)
    Subjective:    CC: R foot pain  I, Mark Fitzgerald, LAT, ATC, am serving as scribe for Dr. Lynne Leader.  HPI: Pt is a 64 y/o male presenting w/ c/o R plantar foot pain x one year .  He locates his pain to his R plantar foot nearest the heel/calcaneus.  He has a hx of L plantar fasciitis and ultimately had surgery after exhausting conservative measures.    R foot swelling: No Aggravating factors: Walking / weight-bearing activity Treatments tried: stretching; LAX ball rolling; Voltaren; massage  Pertinent review of Systems: No fevers or chills  Relevant historical information: Recent right shoulder biceps repair.  Has used nitroglycerin patches in the past.   Objective:    Vitals:   09/22/20 0758  BP: 100/68  Pulse: 73  SpO2: 97%   General: Well Developed, well nourished, and in no acute distress.   MSK: Right foot normal-appearing tender palpation medial plantar calcaneus.  Normal foot motion.  Intact strength.  Lab and Radiology Results X-ray images right foot obtained today personally and independently interpreted plantar and posterior osteophyte calcaneus.  No fractures or severe degenerative changes. Await formal radiology review    Impression and Recommendations:    Assessment and Plan: 64 y.o. male with right plantar heel pain due to plantar fasciitis.  Patient had similar episode on the left foot.  He is already doing a lot of treatment for it over the last year including exercises stretching icing and heel cushion.  Discussed modification of home exercise program to emphasize exercises and heel cushion.  Also recommend night splint.  We will additionally add nitroglycerin patch protocol.  Check back in 2 months if not better consider PRP injection..   Orders Placed This Encounter  Procedures  . DG Foot Complete Right    Standing Status:   Future    Number of Occurrences:   1    Standing Expiration Date:   09/22/2021    Order Specific Question:   Reason  for Exam (SYMPTOM  OR DIAGNOSIS REQUIRED)    Answer:   eval foot pain    Order Specific Question:   Preferred imaging location?    Answer:   [facility]   Meds ordered this encounter  Medications  . nitroGLYCERIN (NITRODUR - DOSED IN MG/24 HR) 0.2 mg/hr patch    Sig: Apply 1/4 patch daily to tendon for tendonitis.    Dispense:  30 patch    Refill:  1    Discussed warning signs or symptoms. Please see discharge instructions. Patient expresses understanding.   The above documentation has been reviewed and is accurate and complete Lynne Leader, M.D.

## 2020-09-22 ENCOUNTER — Encounter: Payer: Self-pay | Admitting: Family Medicine

## 2020-09-22 ENCOUNTER — Other Ambulatory Visit: Payer: Self-pay

## 2020-09-22 ENCOUNTER — Ambulatory Visit: Payer: PPO | Admitting: Family Medicine

## 2020-09-22 ENCOUNTER — Ambulatory Visit (INDEPENDENT_AMBULATORY_CARE_PROVIDER_SITE_OTHER): Payer: PPO

## 2020-09-22 VITALS — BP 100/68 | HR 73 | Ht 71.0 in | Wt 171.6 lb

## 2020-09-22 DIAGNOSIS — M722 Plantar fascial fibromatosis: Secondary | ICD-10-CM

## 2020-09-22 DIAGNOSIS — M7731 Calcaneal spur, right foot: Secondary | ICD-10-CM | POA: Diagnosis not present

## 2020-09-22 MED ORDER — NITROGLYCERIN 0.2 MG/HR TD PT24: MEDICATED_PATCH | TRANSDERMAL | 1 refills | Status: DC

## 2020-09-22 NOTE — Patient Instructions (Signed)
Thank you for coming in today.  Nitroglycerin Protocol   Apply 1/4 nitroglycerin patch to affected area daily.  Change position of patch within the affected area every 24 hours.  You may experience a headache during the first 1-2 weeks of using the patch, these should subside.  If you experience headaches after beginning nitroglycerin patch treatment, you may take your preferred over the counter pain reliever.  Another side effect of the nitroglycerin patch is skin irritation or rash related to patch adhesive.  Please notify our office if you develop more severe headaches or rash, and stop the patch.  Tendon healing with nitroglycerin patch may require 12 to 24 weeks depending on the extent of injury.  Men should not use if taking Viagra, Cialis, or Levitra.   Do not use if you have migraines or rosacea.   Do the exercises.  Good heel cushion, night splint.  This will take months to a year to be all the way better.   Give the nitro patches about 2 months.  If not better let me know and I will proceed to PRP.

## 2020-09-23 NOTE — Progress Notes (Signed)
X-ray right foot shows bone spurs.

## 2020-11-02 DIAGNOSIS — Z1152 Encounter for screening for COVID-19: Secondary | ICD-10-CM | POA: Diagnosis not present

## 2020-12-01 ENCOUNTER — Encounter: Payer: Self-pay | Admitting: Internal Medicine

## 2020-12-13 DIAGNOSIS — N401 Enlarged prostate with lower urinary tract symptoms: Secondary | ICD-10-CM | POA: Diagnosis not present

## 2020-12-13 DIAGNOSIS — Z125 Encounter for screening for malignant neoplasm of prostate: Secondary | ICD-10-CM | POA: Diagnosis not present

## 2020-12-13 DIAGNOSIS — R7989 Other specified abnormal findings of blood chemistry: Secondary | ICD-10-CM | POA: Diagnosis not present

## 2020-12-13 DIAGNOSIS — Z0000 Encounter for general adult medical examination without abnormal findings: Secondary | ICD-10-CM | POA: Diagnosis not present

## 2020-12-20 DIAGNOSIS — M1990 Unspecified osteoarthritis, unspecified site: Secondary | ICD-10-CM | POA: Diagnosis not present

## 2020-12-20 DIAGNOSIS — Z0000 Encounter for general adult medical examination without abnormal findings: Secondary | ICD-10-CM | POA: Diagnosis not present

## 2020-12-20 DIAGNOSIS — J45909 Unspecified asthma, uncomplicated: Secondary | ICD-10-CM | POA: Diagnosis not present

## 2020-12-20 DIAGNOSIS — R82998 Other abnormal findings in urine: Secondary | ICD-10-CM | POA: Diagnosis not present

## 2020-12-20 DIAGNOSIS — N401 Enlarged prostate with lower urinary tract symptoms: Secondary | ICD-10-CM | POA: Diagnosis not present

## 2020-12-20 DIAGNOSIS — Z1212 Encounter for screening for malignant neoplasm of rectum: Secondary | ICD-10-CM | POA: Diagnosis not present

## 2021-01-27 ENCOUNTER — Ambulatory Visit (AMBULATORY_SURGERY_CENTER): Payer: Self-pay | Admitting: *Deleted

## 2021-01-27 ENCOUNTER — Other Ambulatory Visit: Payer: Self-pay

## 2021-01-27 VITALS — Ht 71.0 in | Wt 171.0 lb

## 2021-01-27 DIAGNOSIS — Z1211 Encounter for screening for malignant neoplasm of colon: Secondary | ICD-10-CM

## 2021-01-27 MED ORDER — SUTAB 1479-225-188 MG PO TABS: 24.0000 | ORAL_TABLET | ORAL | 0 refills | Status: DC

## 2021-01-27 NOTE — Progress Notes (Signed)
No egg or soy allergy known to patient  No issues with past sedation with any surgeries or procedures Patient denies ever being told they had issues or difficulty with intubation  No FH of Malignant Hyperthermia No diet pills per patient No home 02 use per patient  No blood thinners per patient  Pt denies issues with constipation  No A fib or A flutter  EMMI video to pt or via [place] 19 guidelines implemented in [place] today with Pt and RN  Pt is fully vaccinated  for [employer] given to pt in PV today , Code to Pharmacy and  NO PA's for preps discussed with pt In PV today  Discussed with pt there will be an out-of-pocket cost for prep and that varies from $0 to 70 dollars   Due to the COVID-19 pandemic we are asking patients to follow certain guidelines.  Pt aware of COVID protocols and [facility] guidelines

## 2021-02-10 ENCOUNTER — Encounter: Payer: Self-pay | Admitting: Internal Medicine

## 2021-02-10 ENCOUNTER — Other Ambulatory Visit: Payer: Self-pay

## 2021-02-10 ENCOUNTER — Ambulatory Visit (AMBULATORY_SURGERY_CENTER): Payer: PPO | Admitting: Internal Medicine

## 2021-02-10 VITALS — BP 104/77 | HR 57 | Temp 97.0°F | Resp 14 | Ht 71.0 in | Wt 171.0 lb

## 2021-02-10 DIAGNOSIS — Z1211 Encounter for screening for malignant neoplasm of colon: Secondary | ICD-10-CM

## 2021-02-10 MED ORDER — SODIUM CHLORIDE 0.9 % IV SOLN: 500.0000 mL | Freq: Once | INTRAVENOUS | Status: DC

## 2021-02-10 NOTE — Op Note (Signed)
[place] Patient Name: Mark Fitzgerald Procedure Date: 02/10/2021 8:30 AM MRN: [id number] Endoscopist: Docia Chuck. Henrene Pastor , MD Age: 65 Referring MD:  Date of Birth: 1955-11-06 Gender: Male Account #: [id number] Procedure:                Colonoscopy Indications:              Screening for colorectal malignant neoplasm.                            Previous examinations 2005, 2011 were negative for                            neoplasia Medicines:                Monitored Anesthesia Care Procedure:                Pre-Anesthesia Assessment:                           - Prior to the procedure, a History and Physical                            was performed, and patient medications and                            allergies were reviewed. The patient's tolerance of                            previous anesthesia was also reviewed. The risks                            and benefits of the procedure and the sedation                            options and risks were discussed with the patient.                            All questions were answered, and informed consent                            was obtained. Prior Anticoagulants: The patient has                            taken no previous anticoagulant or antiplatelet                            agents. ASA Grade Assessment: II - A patient with                            mild systemic disease. After reviewing the risks                            and benefits, the patient was deemed in  satisfactory condition to undergo the procedure.                           After obtaining informed consent, the colonoscope                            was passed under direct vision. Throughout the                            procedure, the patient's blood pressure, pulse, and                            oxygen saturations were monitored continuously. The                            Colonoscope was introduced through the anus and                             advanced to the the cecum, identified by                            appendiceal orifice and ileocecal valve. The                            ileocecal valve, appendiceal orifice, and rectum                            were photographed. The quality of the bowel                            preparation was excellent. The colonoscopy was                            performed without difficulty. The patient tolerated                            the procedure well. The bowel preparation used was                            SUPREP via split dose instruction. Scope In: 8:43:43 AM Scope Out: 8:57:31 AM Scope Withdrawal Time: 0 hours 10 minutes 7 seconds  Total Procedure Duration: 0 hours 13 minutes 48 seconds  Findings:                 The entire examined colon appeared normal on direct                            and retroflexion views. Complications:            No immediate complications. Estimated blood loss:                            None. Estimated Blood Loss:     Estimated blood loss: none. Impression:               - The entire examined  colon is normal on direct and                            retroflexion views.                           - No specimens collected. Recommendation:           - Repeat colonoscopy in 10 years for screening                            purposes.                           - Patient has a contact number available for                            emergencies. The signs and symptoms of potential                            delayed complications were discussed with the                            patient. Return to normal activities tomorrow.                            Written discharge instructions were provided to the                            patient.                           - Resume previous diet.                           - Continue present medications. Docia Chuck. Henrene Pastor, MD 02/10/2021 9:03:34 AM This report has been signed electronically.

## 2021-02-10 NOTE — Patient Instructions (Signed)
Thank you for allowing [place] to care for you today!  Resume previous diet and medications today.  Return to your normal daily activities tomorrow, 02/11/2021.  Recommend your next screening colonoscopy in 10 years at age 65.  Please reach out with any questions or concerns if needed before that time.    YOU HAD AN ENDOSCOPIC PROCEDURE TODAY AT [facility]:   Refer to the procedure report that was given to you for any specific questions about what was found during the examination.  If the procedure report does not answer your questions, please call your gastroenterologist to clarify.  If you requested that your care partner not be given the details of your procedure findings, then the procedure report has been included in a sealed envelope for you to review at your convenience later.  YOU SHOULD EXPECT: Some feelings of bloating in the abdomen. Passage of more gas than usual.  Walking can help get rid of the air that was put into your GI tract during the procedure and reduce the bloating. If you had a lower endoscopy (such as a colonoscopy or flexible sigmoidoscopy) you may notice spotting of blood in your stool or on the toilet paper. If you underwent a bowel prep for your procedure, you may not have a normal bowel movement for a few days.  Please Note:  You might notice some irritation and congestion in your nose or some drainage.  This is from the oxygen used during your procedure.  There is no need for concern and it should clear up in a day or so.  SYMPTOMS TO REPORT IMMEDIATELY:   Following lower endoscopy (colonoscopy or flexible sigmoidoscopy):  Excessive amounts of blood in the stool  Significant tenderness or worsening of abdominal pains  Swelling of the abdomen that is new, acute  Fever of 100F or higher   For urgent or emergent issues, a gastroenterologist can be reached at any hour by calling [phone]. Do not use MyChart messaging for urgent concerns.     DIET:  We do recommend a small meal at first, but then you may proceed to your regular diet.  Drink plenty of fluids but you should avoid alcoholic beverages for 24 hours.  ACTIVITY:  You should plan to take it easy for the rest of today and you should NOT DRIVE or use heavy machinery until tomorrow (because of the sedation medicines used during the test).    FOLLOW UP: Our staff will call the number listed on your records 48-72 hours following your procedure to check on you and address any questions or concerns that you may have regarding the information given to you following your procedure. If we do not reach you, we will leave a message.  We will attempt to reach you two times.  During this call, we will ask if you have developed any symptoms of COVID 19. If you develop any symptoms (ie: fever, flu-like symptoms, shortness of breath, cough etc.) before then, please call [phone].  If you test positive for Covid 19 in the 2 weeks post procedure, please call and report this information to [place].    If any biopsies were taken you will be contacted by phone or by letter within the next 1-3 weeks.  Please call us at [phone] if you have not heard about the biopsies in 3 weeks.    SIGNATURES/CONFIDENTIALITY: You and/or your care partner have signed paperwork which will be entered into your electronic medical record.  These  signatures attest to the fact that that the information above on your After Visit Summary has been reviewed and is understood.  Full responsibility of the confidentiality of this discharge information lies with you and/or your care-partner. 

## 2021-02-10 NOTE — Progress Notes (Signed)
Pt Drowsy. VSS. To PACU, report to RN. No anesthetic complications noted.  

## 2021-02-14 ENCOUNTER — Telehealth: Payer: Self-pay

## 2021-02-14 NOTE — Telephone Encounter (Signed)
LVM

## 2021-05-11 ENCOUNTER — Telehealth: Payer: Self-pay | Admitting: Family Medicine

## 2021-05-11 DIAGNOSIS — M722 Plantar fascial fibromatosis: Secondary | ICD-10-CM

## 2021-05-11 NOTE — Telephone Encounter (Signed)
Patient still having trouble with R foot pain, he has done the nitro patches and is interested in doing PT. He has been through all of this with his L foot and eventually had surgery, he figures he will at some point need surgery on the R foot but would like to try some PT first.  Would like to be referred to [facility] as they have worked together before.

## 2021-05-12 NOTE — Telephone Encounter (Signed)
Left VM for pt advising that PT referral sent to [facility].

## 2021-05-12 NOTE — Telephone Encounter (Signed)
Referral placed today

## 2021-05-17 ENCOUNTER — Other Ambulatory Visit: Payer: Self-pay | Admitting: Family Medicine

## 2021-05-17 DIAGNOSIS — M79671 Pain in right foot: Secondary | ICD-10-CM | POA: Diagnosis not present

## 2021-05-17 DIAGNOSIS — M722 Plantar fascial fibromatosis: Secondary | ICD-10-CM

## 2021-05-17 DIAGNOSIS — R269 Unspecified abnormalities of gait and mobility: Secondary | ICD-10-CM | POA: Diagnosis not present

## 2021-05-18 NOTE — Telephone Encounter (Signed)
Rx refill request approved per Dr. Corey's orders. 

## 2021-05-26 DIAGNOSIS — R269 Unspecified abnormalities of gait and mobility: Secondary | ICD-10-CM | POA: Diagnosis not present

## 2021-05-26 DIAGNOSIS — M79671 Pain in right foot: Secondary | ICD-10-CM | POA: Diagnosis not present

## 2021-05-30 DIAGNOSIS — R269 Unspecified abnormalities of gait and mobility: Secondary | ICD-10-CM | POA: Diagnosis not present

## 2021-05-30 DIAGNOSIS — M79671 Pain in right foot: Secondary | ICD-10-CM | POA: Diagnosis not present

## 2021-06-09 DIAGNOSIS — M79671 Pain in right foot: Secondary | ICD-10-CM | POA: Diagnosis not present

## 2021-06-09 DIAGNOSIS — R269 Unspecified abnormalities of gait and mobility: Secondary | ICD-10-CM | POA: Diagnosis not present

## 2021-06-20 DIAGNOSIS — M79671 Pain in right foot: Secondary | ICD-10-CM | POA: Diagnosis not present

## 2021-06-20 DIAGNOSIS — R269 Unspecified abnormalities of gait and mobility: Secondary | ICD-10-CM | POA: Diagnosis not present

## 2021-06-22 DIAGNOSIS — L821 Other seborrheic keratosis: Secondary | ICD-10-CM | POA: Diagnosis not present

## 2021-06-22 DIAGNOSIS — L308 Other specified dermatitis: Secondary | ICD-10-CM | POA: Diagnosis not present

## 2021-06-22 DIAGNOSIS — L812 Freckles: Secondary | ICD-10-CM | POA: Diagnosis not present

## 2021-06-28 DIAGNOSIS — M79671 Pain in right foot: Secondary | ICD-10-CM | POA: Diagnosis not present

## 2021-06-28 DIAGNOSIS — R269 Unspecified abnormalities of gait and mobility: Secondary | ICD-10-CM | POA: Diagnosis not present

## 2021-07-08 DIAGNOSIS — M79671 Pain in right foot: Secondary | ICD-10-CM | POA: Diagnosis not present

## 2021-07-08 DIAGNOSIS — R269 Unspecified abnormalities of gait and mobility: Secondary | ICD-10-CM | POA: Diagnosis not present

## 2021-07-10 DIAGNOSIS — R319 Hematuria, unspecified: Secondary | ICD-10-CM | POA: Diagnosis not present

## 2021-07-10 DIAGNOSIS — R82998 Other abnormal findings in urine: Secondary | ICD-10-CM | POA: Diagnosis not present

## 2021-07-14 DIAGNOSIS — Z23 Encounter for immunization: Secondary | ICD-10-CM | POA: Diagnosis not present

## 2021-07-14 DIAGNOSIS — R319 Hematuria, unspecified: Secondary | ICD-10-CM | POA: Diagnosis not present

## 2021-07-18 DIAGNOSIS — R269 Unspecified abnormalities of gait and mobility: Secondary | ICD-10-CM | POA: Diagnosis not present

## 2021-07-18 DIAGNOSIS — M79671 Pain in right foot: Secondary | ICD-10-CM | POA: Diagnosis not present

## 2021-07-28 DIAGNOSIS — M79671 Pain in right foot: Secondary | ICD-10-CM | POA: Diagnosis not present

## 2021-07-28 DIAGNOSIS — R269 Unspecified abnormalities of gait and mobility: Secondary | ICD-10-CM | POA: Diagnosis not present

## 2021-08-27 IMAGING — DX DG FOOT COMPLETE 3+V*R*
3 series · 3 of 3 positions shown · non-contrast
Comparison: None.

CLINICAL DATA: Plantar foot pain

EXAM:
RIGHT FOOT COMPLETE - 3+ VIEW

[foot ap]
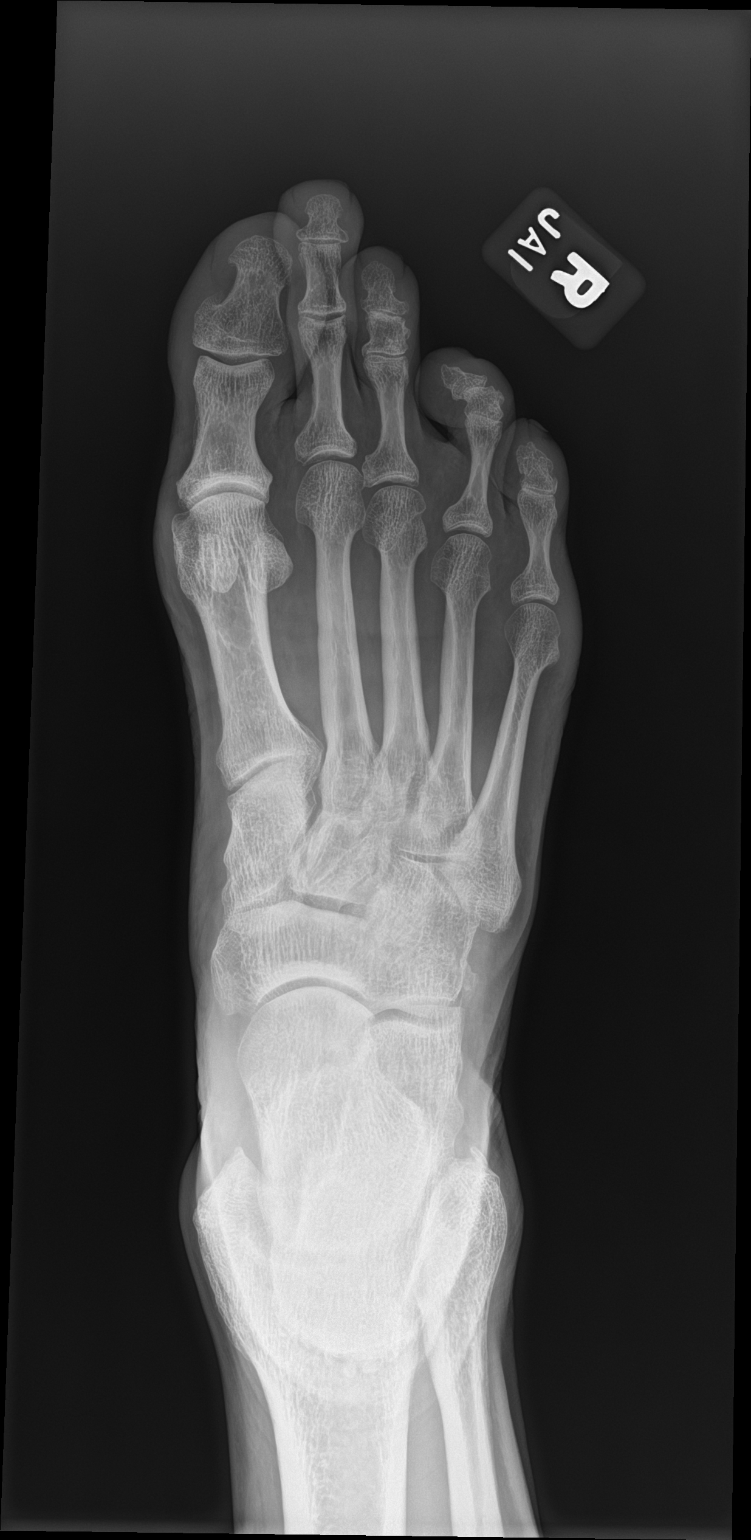

[foot obl]
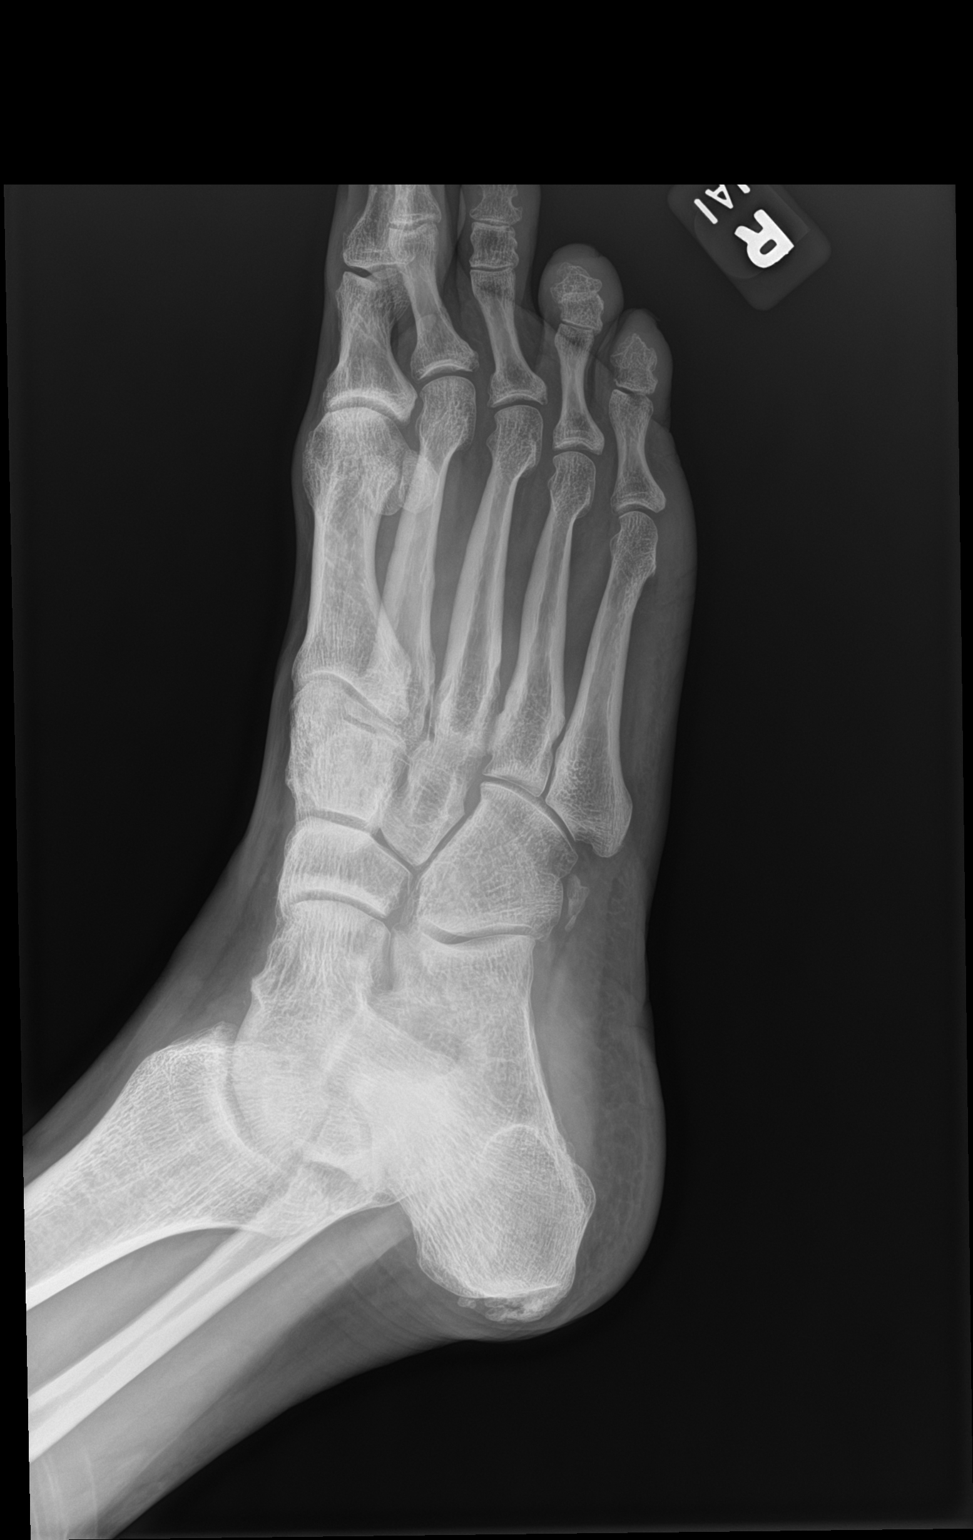

[foot lat]
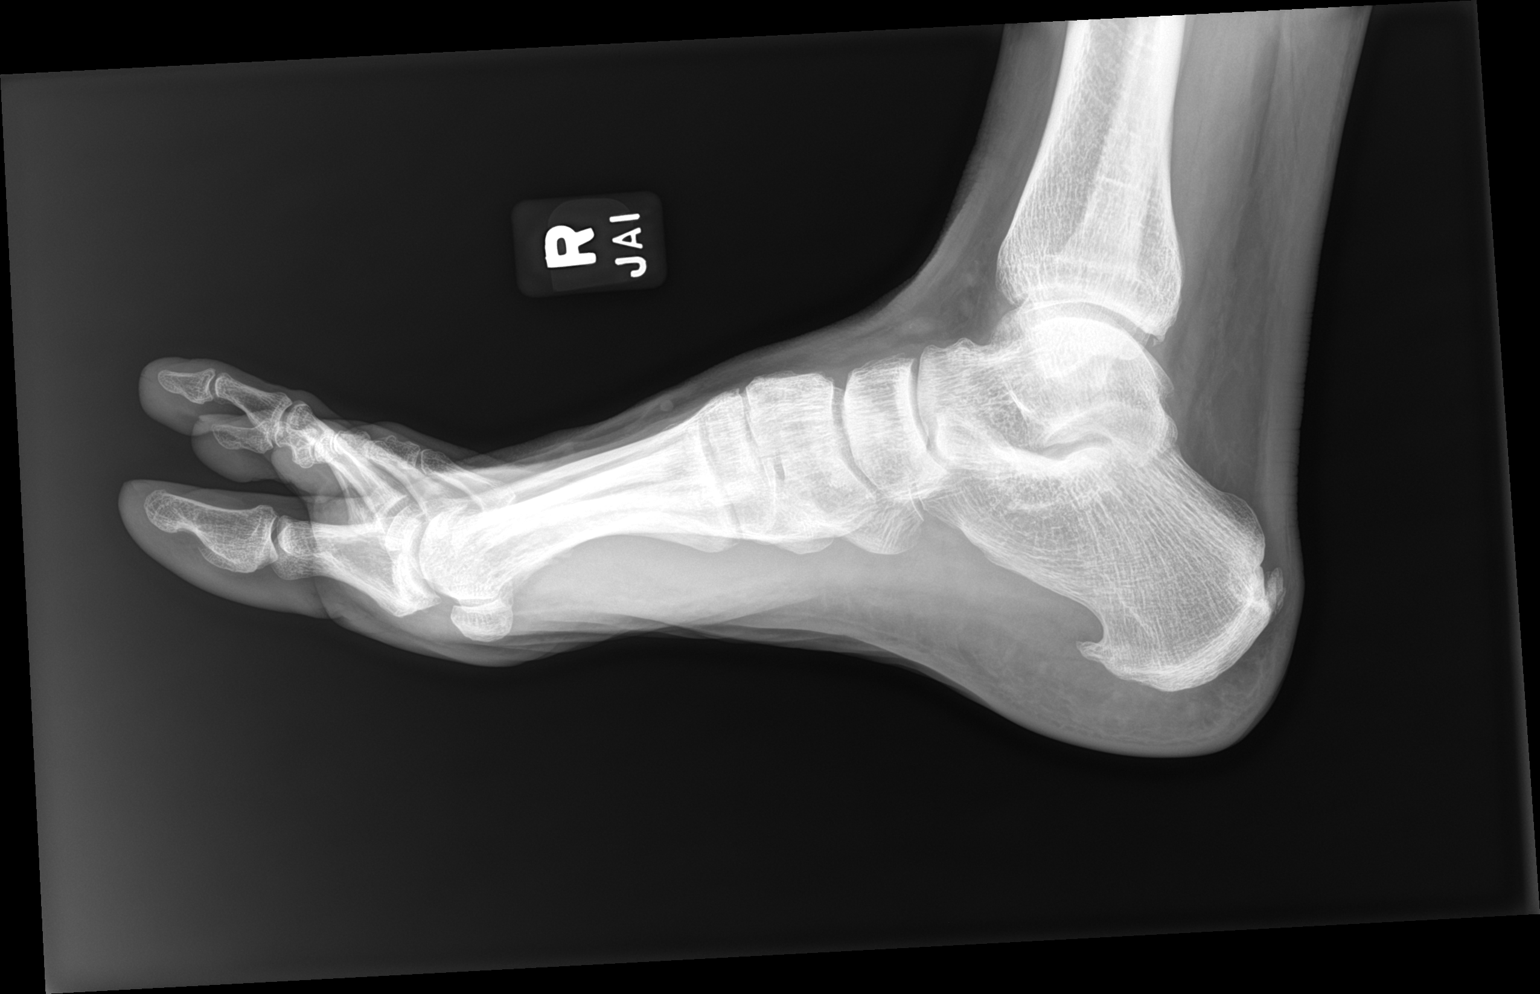

[3 of 3 positions shown; findings below may reference images not displayed]

FINDINGS: Negative for fracture.  No significant arthropathy

Calcaneal spurring on the plantar surface and at the Achilles tendon
insertion.

Soft tissue calcification adjacent to the cuboid appears chronic.
IMPRESSION: Calcaneal spurring.  No acute abnormality.

## 2021-11-03 DIAGNOSIS — S0501XA Injury of conjunctiva and corneal abrasion without foreign body, right eye, initial encounter: Secondary | ICD-10-CM | POA: Diagnosis not present

## 2021-12-22 DIAGNOSIS — R7989 Other specified abnormal findings of blood chemistry: Secondary | ICD-10-CM | POA: Diagnosis not present

## 2021-12-22 DIAGNOSIS — Z125 Encounter for screening for malignant neoplasm of prostate: Secondary | ICD-10-CM | POA: Diagnosis not present

## 2021-12-22 DIAGNOSIS — Z0000 Encounter for general adult medical examination without abnormal findings: Secondary | ICD-10-CM | POA: Diagnosis not present

## 2021-12-22 DIAGNOSIS — N401 Enlarged prostate with lower urinary tract symptoms: Secondary | ICD-10-CM | POA: Diagnosis not present

## 2022-01-09 DIAGNOSIS — Z1331 Encounter for screening for depression: Secondary | ICD-10-CM | POA: Diagnosis not present

## 2022-01-09 DIAGNOSIS — R82998 Other abnormal findings in urine: Secondary | ICD-10-CM | POA: Diagnosis not present

## 2022-01-09 DIAGNOSIS — N401 Enlarged prostate with lower urinary tract symptoms: Secondary | ICD-10-CM | POA: Diagnosis not present

## 2022-01-09 DIAGNOSIS — M1990 Unspecified osteoarthritis, unspecified site: Secondary | ICD-10-CM | POA: Diagnosis not present

## 2022-01-09 DIAGNOSIS — L409 Psoriasis, unspecified: Secondary | ICD-10-CM | POA: Diagnosis not present

## 2022-01-09 DIAGNOSIS — Z1339 Encounter for screening examination for other mental health and behavioral disorders: Secondary | ICD-10-CM | POA: Diagnosis not present

## 2022-01-09 DIAGNOSIS — Z0000 Encounter for general adult medical examination without abnormal findings: Secondary | ICD-10-CM | POA: Diagnosis not present

## 2022-01-24 DIAGNOSIS — L308 Other specified dermatitis: Secondary | ICD-10-CM | POA: Diagnosis not present

## 2022-01-24 DIAGNOSIS — L2089 Other atopic dermatitis: Secondary | ICD-10-CM | POA: Diagnosis not present

## 2022-04-28 DIAGNOSIS — K644 Residual hemorrhoidal skin tags: Secondary | ICD-10-CM | POA: Diagnosis not present

## 2022-06-02 ENCOUNTER — Other Ambulatory Visit: Payer: Self-pay

## 2022-06-02 ENCOUNTER — Emergency Department (HOSPITAL_COMMUNITY): Payer: PPO

## 2022-06-02 ENCOUNTER — Emergency Department (HOSPITAL_COMMUNITY)
Admit: 2022-06-02 | Discharge: 2022-06-02 | Disposition: A | Discharge status: Home / Self Care | Payer: PPO | Attending: Cardiology | Admitting: Cardiology

## 2022-06-02 ENCOUNTER — Inpatient Hospital Stay (HOSPITAL_COMMUNITY)
Admission: EM | Admit: 2022-06-02 | Discharge: 2022-06-06 | DRG: 247 | Disposition: A | Discharge status: Home / Self Care | Payer: PPO | Attending: Cardiology | Admitting: Cardiology

## 2022-06-02 ENCOUNTER — Encounter (HOSPITAL_COMMUNITY): Payer: Self-pay

## 2022-06-02 ENCOUNTER — Other Ambulatory Visit: Payer: Self-pay | Admitting: Cardiology

## 2022-06-02 DIAGNOSIS — R079 Chest pain, unspecified: Secondary | ICD-10-CM | POA: Diagnosis not present

## 2022-06-02 DIAGNOSIS — I2510 Atherosclerotic heart disease of native coronary artery without angina pectoris: Secondary | ICD-10-CM | POA: Diagnosis not present

## 2022-06-02 DIAGNOSIS — I200 Unstable angina: Secondary | ICD-10-CM | POA: Diagnosis not present

## 2022-06-02 DIAGNOSIS — R072 Precordial pain: Secondary | ICD-10-CM

## 2022-06-02 DIAGNOSIS — I25110 Atherosclerotic heart disease of native coronary artery with unstable angina pectoris: Principal | ICD-10-CM | POA: Diagnosis present

## 2022-06-02 DIAGNOSIS — R001 Bradycardia, unspecified: Secondary | ICD-10-CM | POA: Diagnosis present

## 2022-06-02 DIAGNOSIS — Z955 Presence of coronary angioplasty implant and graft: Secondary | ICD-10-CM

## 2022-06-02 DIAGNOSIS — R0789 Other chest pain: Principal | ICD-10-CM

## 2022-06-02 DIAGNOSIS — R519 Headache, unspecified: Secondary | ICD-10-CM | POA: Diagnosis present

## 2022-06-02 DIAGNOSIS — N289 Disorder of kidney and ureter, unspecified: Secondary | ICD-10-CM | POA: Diagnosis present

## 2022-06-02 DIAGNOSIS — E785 Hyperlipidemia, unspecified: Secondary | ICD-10-CM | POA: Diagnosis present

## 2022-06-02 DIAGNOSIS — Z8249 Family history of ischemic heart disease and other diseases of the circulatory system: Secondary | ICD-10-CM

## 2022-06-02 DIAGNOSIS — Z800 Family history of malignant neoplasm of digestive organs: Secondary | ICD-10-CM

## 2022-06-02 DIAGNOSIS — R931 Abnormal findings on diagnostic imaging of heart and coronary circulation: Secondary | ICD-10-CM | POA: Diagnosis not present

## 2022-06-02 LAB — HEPATIC FUNCTION PANEL
ALT: 19 U/L (ref 0–44)
AST: 25 U/L (ref 15–41)
Albumin: 3.6 g/dL (ref 3.5–5.0)
Alkaline Phosphatase: 40 U/L (ref 38–126)
Bilirubin, Direct: <0.1 mg/dL (ref 0.0–0.2)
Total Bilirubin: 0.9 mg/dL (ref 0.3–1.2)
Total Protein: 6.2 g/dL — ABNORMAL LOW (ref 6.5–8.1)

## 2022-06-02 LAB — CBC
HCT: 46.5 % (ref 39.0–52.0)
Hemoglobin: 15.9 g/dL (ref 13.0–17.0)
MCH: 33 pg (ref 26.0–34.0)
MCHC: 34.2 g/dL (ref 30.0–36.0)
MCV: 96.5 fL (ref 80.0–100.0)
Platelets: 213 10*3/uL (ref 150–400)
RBC: 4.82 MIL/uL (ref 4.22–5.81)
RDW: 13.1 % (ref 11.5–15.5)
WBC: 5.7 10*3/uL (ref 4.0–10.5)
nRBC: 0 % (ref 0.0–0.2)

## 2022-06-02 LAB — BASIC METABOLIC PANEL
Anion gap: 7 (ref 5–15)
BUN: 16 mg/dL (ref 8–23)
CO2: 27 mmol/L (ref 22–32)
Calcium: 9.4 mg/dL (ref 8.9–10.3)
Chloride: 103 mmol/L (ref 98–111)
Creatinine, Ser: 1.31 mg/dL — ABNORMAL HIGH (ref 0.61–1.24)
GFR, Estimated: 59 mL/min — ABNORMAL LOW (ref >60)
Glucose, Bld: 108 mg/dL — ABNORMAL HIGH (ref 70–99)
Potassium: 4.4 mmol/L (ref 3.5–5.1)
Sodium: 137 mmol/L (ref 135–145)

## 2022-06-02 LAB — PROTIME-INR
INR: 1.1 (ref 0.8–1.2)
Prothrombin Time: 14 seconds (ref 11.4–15.2)

## 2022-06-02 LAB — HEPARIN LEVEL (UNFRACTIONATED): Heparin Unfractionated: 0.13 IU/mL — ABNORMAL LOW (ref 0.30–0.70)

## 2022-06-02 LAB — HIV ANTIBODY (ROUTINE TESTING W REFLEX): HIV Screen 4th Generation wRfx: NONREACTIVE

## 2022-06-02 LAB — TROPONIN I (HIGH SENSITIVITY)
Troponin I (High Sensitivity): 5 ng/L (ref <18)
Troponin I (High Sensitivity): 4 ng/L (ref <18)
Troponin I (High Sensitivity): 5 ng/L (ref <18)
Troponin I (High Sensitivity): 5 ng/L (ref <18)

## 2022-06-02 LAB — TSH: TSH: 1.529 u[IU]/mL (ref 0.350–4.500)

## 2022-06-02 MED ORDER — ASPIRIN 81 MG PO TBEC
81.0000 mg | DELAYED_RELEASE_TABLET | Freq: Every day | ORAL | Status: DC
  Administered 2022-06-03 – 2022-06-06 (×3): 81 mg via ORAL
  Filled 2022-06-02 (×3): qty 1

## 2022-06-02 MED ORDER — NITROGLYCERIN 0.4 MG SL SUBL
0.4000 mg | SUBLINGUAL_TABLET | SUBLINGUAL | Status: DC | PRN
Start: 2022-06-02 — End: 2022-06-05
  Administered 2022-06-02 (×2): 0.4 mg via SUBLINGUAL
  Filled 2022-06-02: qty 1

## 2022-06-02 MED ORDER — ROSUVASTATIN CALCIUM 20 MG PO TABS
40.0000 mg | ORAL_TABLET | Freq: Every day | ORAL | Status: DC
  Administered 2022-06-02 – 2022-06-06 (×5): 40 mg via ORAL
  Filled 2022-06-02 (×5): qty 2

## 2022-06-02 MED ORDER — HEPARIN (PORCINE) 25000 UT/250ML-% IV SOLN
1600.0000 [IU]/h | INTRAVENOUS | Status: DC
Start: 2022-06-02 — End: 2022-06-05
  Administered 2022-06-02: 900 [IU]/h via INTRAVENOUS
  Administered 2022-06-03: 1150 [IU]/h via INTRAVENOUS
  Filled 2022-06-02 (×3): qty 250

## 2022-06-02 MED ORDER — ACETAMINOPHEN 325 MG PO TABS: 650.0000 mg | ORAL_TABLET | ORAL | Status: DC | PRN

## 2022-06-02 MED ORDER — NITROGLYCERIN 0.4 MG SL SUBL
SUBLINGUAL_TABLET | SUBLINGUAL | Status: AC
  Filled 2022-06-02: qty 2

## 2022-06-02 MED ORDER — ADULT MULTIVITAMIN W/MINERALS CH
1.0000 | ORAL_TABLET | Freq: Every day | ORAL | Status: DC
  Administered 2022-06-03 – 2022-06-06 (×4): 1 via ORAL
  Filled 2022-06-02 (×4): qty 1

## 2022-06-02 MED ORDER — KETOROLAC TROMETHAMINE 15 MG/ML IJ SOLN
15.0000 mg | Freq: Once | INTRAMUSCULAR | Status: AC
Start: 2022-06-02 — End: 2022-06-02
  Administered 2022-06-02: 15 mg via INTRAVENOUS
  Filled 2022-06-02: qty 1

## 2022-06-02 MED ORDER — HEPARIN BOLUS VIA INFUSION
4000.0000 [IU] | Freq: Once | INTRAVENOUS | Status: AC
  Administered 2022-06-02: 4000 [IU] via INTRAVENOUS
  Filled 2022-06-02: qty 4000

## 2022-06-02 MED ORDER — SODIUM CHLORIDE 0.9 % IV SOLN: INTRAVENOUS | Status: DC

## 2022-06-02 MED ORDER — MORPHINE SULFATE (PF) 2 MG/ML IV SOLN: 1.0000 mg | INTRAVENOUS | Status: DC | PRN

## 2022-06-02 MED ORDER — ONDANSETRON HCL 4 MG/2ML IJ SOLN: 4.0000 mg | Freq: Four times a day (QID) | INTRAMUSCULAR | Status: DC | PRN

## 2022-06-02 MED ORDER — ASPIRIN 81 MG PO CHEW
324.0000 mg | CHEWABLE_TABLET | Freq: Once | ORAL | Status: AC
  Administered 2022-06-02: 324 mg via ORAL
  Filled 2022-06-02: qty 4

## 2022-06-02 MED ORDER — HEPARIN BOLUS VIA INFUSION
2000.0000 [IU] | Freq: Once | INTRAVENOUS | Status: AC
  Administered 2022-06-03: 2000 [IU] via INTRAVENOUS
  Filled 2022-06-02: qty 2000

## 2022-06-02 MED ORDER — IOHEXOL 350 MG/ML SOLN
100.0000 mL | Freq: Once | INTRAVENOUS | Status: AC | PRN
  Administered 2022-06-02: 100 mL via INTRAVENOUS

## 2022-06-02 MED ORDER — MORPHINE SULFATE (PF) 2 MG/ML IV SOLN: 2.0000 mg | INTRAVENOUS | Status: DC | PRN

## 2022-06-02 NOTE — ED Triage Notes (Addendum)
Reports chest pain that started a day or so ago and now having pain into his back.  Denies SOB dizziness, sweating, n/v  Reports this could be related to working out at the gym a couple days ago. s

## 2022-06-02 NOTE — ED Provider Notes (Addendum)
[facility] EMERGENCY DEPARTMENT Provider Note   CSN: [id number] Arrival date & time: 06/02/22  6222     History  Chief Complaint  Patient presents with   Chest Pain    Mark Fitzgerald is a 66 y.o. male.  HPI      66 year old male comes in with chief complaint of chest pain. 2 days ago patient was at the gym started having left-sided chest pain described as pressure.  Pain lasted for about 3 hours and then resolved.  Yesterday was fine, but this morning woke up with similar pressure type pain.  This time the pain is radiating to the back.  Patient denies any associated shortness of breath, nausea, vomiting, dizziness.  Pain is not worse with movement of the upper extremity or palpation of the chest.  No previous history of similar symptoms.  Pain not worse with deep inspiration.  Pt has no hx of PE, DVT and denies any exogenous hormone (testosterone / estrogen) use, long distance travels or surgery in the past 6 weeks, active cancer, recent immobilization.  Patient has no significant medical history.  Indicates that his younger brother had heart attack.  Patient does not smoke or use any drugs.    Home Medications Prior to Admission medications   Medication Sig Start Date End Date Taking? Authorizing Provider  Multiple Vitamins-Minerals (MULTIVITAMIN WITH MINERALS) tablet Take 1 tablet by mouth daily.    [provider]  nitroGLYCERIN (NITRODUR - DOSED IN MG/24 HR) 0.2 mg/hr patch APPLY 1/4 PATCH DAILY TO TENDON FOR TENDONITIS. 05/18/21   Gregor Hams, MD      Allergies    Patient has no known allergies.    Review of Systems   Review of Systems  All other systems reviewed and are negative.   Physical Exam Updated Vital Signs BP 112/80   Pulse (!) 53   Temp 97.6 F (36.4 C) (Oral)   Resp 14   Ht '5\' 11"'$  (1.803 m)   Wt 74.8 kg   SpO2 100%   BMI 23.01 kg/m  Physical Exam Vitals and nursing note reviewed.  Constitutional:       Appearance: He is well-developed.  HENT:     Head: Atraumatic.  Neck:     Vascular: No JVD.  Cardiovascular:     Rate and Rhythm: Normal rate.     Heart sounds: Normal heart sounds.  Pulmonary:     Effort: Pulmonary effort is normal.  Musculoskeletal:     Cervical back: Neck supple.  Skin:    General: Skin is warm.  Neurological:     Mental Status: He is alert and oriented to person, place, and time.     ED Results / Procedures / Treatments   Labs (all labs ordered are listed, but only abnormal results are displayed) Labs Reviewed  BASIC METABOLIC PANEL - Abnormal; Notable for the following components:      Result Value   Glucose, Bld 108 (*)    Creatinine, Ser 1.31 (*)    GFR, Estimated 59 (*)    All other components within normal limits  CBC  TROPONIN I (HIGH SENSITIVITY)  TROPONIN I (HIGH SENSITIVITY)    EKG EKG Interpretation  Date/Time:  Friday June 02 2022 08:43:43 EDT Ventricular Rate:  82 PR Interval:  170 QRS Duration: 74 QT Interval:  364 QTC Calculation: 425 R Axis:   75 Text Interpretation: Normal sinus rhythm Normal ECG When compared with ECG of 28-Jul-2005 13:54, PREVIOUS ECG IS  PRESENT No acute changes Confirmed by Varney Biles [phone]) on 06/02/2022 9:53:57 AM  Radiology CT CORONARY FRACTIONAL FLOW RESERVE DATA PREP  Result Date: 06/02/2022 EXAM: FFRCT ANALYSIS FINDINGS: FFRct analysis was performed on the original cardiac CT angiogram dataset. Diagrammatic representation of the FFRct analysis is provided in a separate PDF document in PACS. This dictation was created using the PDF document and an interactive 3D model of the results. 3D model is not available in the EMR/PACS. Normal FFR range is >0.80. 1. Left Main: 1.0 to 0.95 2. LAD: 0.95 to 0.75 proximal to mid, 0.67 distal 3. LCX: 0.9 mid, 0.79 distal 4. Ramus: NA 5. RCA: 1.0 to 0.91 IMPRESSION: 1.  Abnormal FFR LAD suggestive of flow limiting CAD 2.  Recommend cardiac catheterization. Note:  These examples are not recommendations of [employer] and only provided as examples of what other customers are doing. Electronically Signed   By: Candee Furbish M.D.   On: 06/02/2022 15:34   CT CORONARY FRACTIONAL FLOW RESERVE FLUID ANALYSIS  Result Date: 06/02/2022 EXAM: FFRCT ANALYSIS FINDINGS: FFRct analysis was performed on the original cardiac CT angiogram dataset. Diagrammatic representation of the FFRct analysis is provided in a separate PDF document in PACS. This dictation was created using the PDF document and an interactive 3D model of the results. 3D model is not available in the EMR/PACS. Normal FFR range is >0.80. 1. Left Main: 1.0 to 0.95 2. LAD: 0.95 to 0.75 proximal to mid, 0.67 distal 3. LCX: 0.9 mid, 0.79 distal 4. Ramus: NA 5. RCA: 1.0 to 0.91 IMPRESSION: 1.  Abnormal FFR LAD suggestive of flow limiting CAD 2.  Recommend cardiac catheterization. Note: These examples are not recommendations of [employer] and only provided as examples of what other customers are doing. Electronically Signed   By: Candee Furbish M.D.   On: 06/02/2022 15:34   CT CORONARY MORPH W/CTA COR W/SCORE W/CA W/CM &/OR WO/CM  Addendum Date: 06/02/2022   ADDENDUM REPORT: 06/02/2022 15:25 CLINICAL DATA:  66 year old with chest pain EXAM: Cardiac/Coronary  CTA TECHNIQUE: The patient was scanned on a [employer]. FINDINGS: A 120 kV prospective scan was triggered in the descending thoracic aorta at 111 HU's. Axial non-contrast 3 mm slices were carried out through the heart. The data set was analyzed on a dedicated work station and scored using the [employer]. Gantry rotation speed was 250 msecs and collimation was .6 mm. 0.8 mg of sl NTG was given. The 3D data set was reconstructed in 5% intervals of the 67-82 % of the R-R cycle. Diastolic phases were analyzed on a dedicated work station using MPR, MIP and VRT modes. The patient received 80 cc of contrast. Image quality: good Aorta:  Normal size.  No calcifications.   No dissection. Aortic Valve: No calcifications. Coronary Arteries:  Normal coronary origin.  Right dominance. RCA is a large dominant artery that gives rise to PDA and PLA. There is scattered calcified plaque, 50-69% stenosis prox/mid. Mild 30-49% distal. Left main is a large artery that gives rise to LAD and LCX arteries. There is 50% stenosis distal left main. LAD is a large vessel that has scattered calcified and non calcified plaque. There is severe 70-99% stenosis proximally, mild 30-49% stenosis distally. LCX is a non-dominant artery that gives rise to one large OM1 branch. There is scattered calcified plaque. Moderate 50-69% stenosis prox/mid circ. Other findings: Normal pulmonary vein drainage into the left atrium. Normal left atrial appendage without a thrombus. Normal size of the  pulmonary artery. Please see radiology report for non cardiac findings. IMPRESSION: 1. Coronary calcium score of 1659. This was 55 percentile for age and sex matched control. 2. Normal coronary origin with right dominance. 3. Distal Left main 50% stenosis. 4. Severe proximal LAD stenosis 70-99%. FFR 0.75 post lesion, 0.67 distal. Abnormal flow. 5. Moderate RCA and LCX stenosis 50-69%.  (FFR negative) CAD-RADS 4 Severe stenosis. (70-99% or > 50% left main). Cardiac catheterization is recommended. Consider symptom-guided anti-ischemic pharmacotherapy as well as risk factor modification per guideline directed care. Electronically Signed   By: Candee Furbish M.D.   On: 06/02/2022 15:25   Result Date: 06/02/2022 EXAM: OVER-READ INTERPRETATION  CT CHEST The following report is a limited chest CT over-read performed by radiologist Dr. Yetta Glassman of [facility], [place] on 06/02/2022. This over-read does not include interpretation of cardiac or coronary anatomy or pathology. The cardiac CTA interpretation by the cardiologist is attached. COMPARISON:  None Available. FINDINGS: Vascular: Normal heart size. No pericardial effusion.  No suspicious filling defects of the central pulmonary arteries. Mediastinum/Nodes: Esophagus is unremarkable. No pathologically enlarged lymph nodes seen in the chest. Lungs/Pleura: Lungs are clear. No pleural effusion or pneumothorax. Upper Abdomen: No acute abnormality. Musculoskeletal: No chest wall mass or suspicious bone lesions identified. IMPRESSION: No significant extracardiac abnormality. Electronically Signed: By: Yetta Glassman M.D. On: 06/02/2022 14:53   DG Chest 2 View  Result Date: 06/02/2022 CLINICAL DATA:  Chest pain. EXAM: CHEST - 2 VIEW COMPARISON:  None Available. FINDINGS: Both lungs are clear. Heart and mediastinum are within normal limits. Trachea is midline. No large pleural effusions. No acute bone abnormality. Negative for a pneumothorax. IMPRESSION: No active cardiopulmonary disease. Electronically Signed   By: Markus Daft M.D.   On: 06/02/2022 09:03    Procedures .Critical Care  Performed by: Varney Biles, MD Authorized by: Varney Biles, MD   Critical care provider statement:    Critical care time (minutes):  36   Critical care was necessary to treat or prevent imminent or life-threatening deterioration of the following conditions:  Cardiac failure   Critical care was time spent personally by me on the following activities:  Development of treatment plan with patient or surrogate, discussions with consultants, evaluation of patient's response to treatment, examination of patient, ordering and review of laboratory studies, ordering and review of radiographic studies, ordering and performing treatments and interventions, pulse oximetry, re-evaluation of patient's condition and review of old charts     Medications Ordered in ED Medications  nitroGLYCERIN (NITROSTAT) SL tablet 0.4 mg (0.4 mg Sublingual Given 06/02/22 1234)  aspirin chewable tablet 324 mg (324 mg Oral Given 06/02/22 1230)  iohexol (OMNIPAQUE) 350 MG/ML injection 100 mL (100 mLs Intravenous Contrast  Given 06/02/22 1412)    ED Course/ Medical Decision Making/ A&P           HEART Score: 3                Medical Decision Making Amount and/or Complexity of Data Reviewed Labs: ordered. Radiology: ordered.  Risk OTC drugs. Prescription drug management. Decision regarding hospitalization.   This patient presents to the ED with chief complaint(s) of chest pain with no significant past medical history.  The differential diagnosis includes  ACS syndrome Aortic dissection Pericarditis PE Pneumothorax Musculoskeletal pain PUD / Gastritis / Esophagitis Esophageal spasm   Based on history and exam, does not appear to be high risk for dissection, PE.  I do not think blood work-up will be needed.  Patient will need ACS rule out.  Hear score is 3.  Initial EKG and troponin are reassuring.  The initial plan is to discuss case with cardiology -patient might be a good candidate for coronary CT   Independent labs interpretation:  The following labs were independently interpreted: High-sensitivity troponin is normal.  Basic lab work-up is also normal  Independent visualization of imaging: - I independently visualized the following imaging with scope of interpretation limited to determining acute life threatening conditions related to emergency care: X-ray of the chest, which revealed no evidence of pneumothorax  Treatment and Reassessment: No improvement with nitroglycerin   Consultation: - Consulted or discussed management/test interpretation w/ external professional: Cardiology service, they recommend getting CT coronary, which has been ordered.  3:47 PM Coronary CT showing significant stenosis. Cardiology service will admit.  Heparin initiated for unstable angina.    Final Clinical Impression(s) / ED Diagnoses Final diagnoses:  Atypical chest pain  Precordial chest pain  Unstable angina [facility])    Rx / DC Orders ED Discharge Orders     None         Varney Biles,  MD 06/02/22 1553

## 2022-06-02 NOTE — H&P (Addendum)
Cardiology Admission History and Physical:   Patient ID: Mark Fitzgerald MRN: [id number]; DOB: 1956-05-01   Admission date: 06/02/2022  PCP:  Burnard Bunting, MD   [facility] Providers Cardiologist:  New to Dr. Johney Frame    Chief Complaint:  chest pain  Patient Profile:   Mark Fitzgerald is a 66 y.o. male with no significant PMH but family history of CAD in brother (MI 62s) who is being seen 06/02/2022 for the evaluation of chest pain and abnormal coronary CT.  History of Present Illness:   Mark Fitzgerald has no prior cardiac history or known medical problems. His brother had a heart attack in his 37s. He is very active and plays tennis. A few days ago he has at the gym and had a transient episode of sharp chest pain lasting about 10 seconds. It went away and he was able to carry on like normal. However, this morning he woke up with a similar type of pain though this time radiating to his back/upper shoulder blades. Pain is not worse with inspiration, palpation, specific movements. It has remained constant and he can still feel it in the range of 6/10, mostly towards his shoulder blades. EKGs have been nonacute. No associated symptoms. Troponins neg x 2, CBC wnl, K 4.4, Cr 1.31 (no prior to compare to). He received '324mg'$  ASA, 2L NTG, '15mg'$  of Toradol without significant change. Coronary CT showed CAC 1659 (93%ile), 50% dLM, 70-99% prox LAD, 50-69% moderate RCA and LCx stenosis -  FFR was abnormal in the LAD suggestive of physiologically significant stenosis. There was no aortic dissection. Cardiology was subsequently consulted for admission to the hospital. He also notes a headache this afternoon in the setting of SL NTG and not eating. No focal neuro sx. Repeat EKG this PM remains unchanged from prior.  Past Medical History:  Diagnosis Date   Healthy adult on routine physical examination     Past Surgical History:  Procedure Laterality Date   BACK SURGERY     bicep surgery Right     shoulder   COLONOSCOPY     WRIST SURGERY Right      Medications Prior to Admission: Prior to Admission medications   Medication Sig Start Date End Date Taking? Authorizing Provider  Multiple Vitamins-Minerals (MULTIVITAMIN WITH MINERALS) tablet Take 1 tablet by mouth daily.    [provider]  nitroGLYCERIN (NITRODUR - DOSED IN MG/24 HR) 0.2 mg/hr patch APPLY 1/4 PATCH DAILY TO TENDON FOR TENDONITIS. 05/18/21   Gregor Hams, MD     Allergies:   No Known Allergies  Social History:   Social History   Socioeconomic History   Marital status: Married    Spouse name: Not on file   Number of children: Not on file   Years of education: Not on file   Highest education level: Not on file  Occupational History   Not on file  Tobacco Use   Smoking status: Never   Smokeless tobacco: Never  Vaping Use   Vaping Use: Never used  Substance and Sexual Activity   Alcohol use: Yes    Comment: social    Drug use: Never   Sexual activity: Not on file  Other Topics Concern   Not on file  Social History Narrative   Not on file   Social Determinants of Health   Financial Resource Strain: Not on file  Food Insecurity: Not on file  Transportation Needs: Not on file  Physical Activity: Not on file  Stress:  Not on file  Social Connections: Not on file  Intimate Partner Violence: Not on file    Family History:   The patient's family history includes Cancer in his mother; Heart attack in his brother; Pancreatic cancer in his father. There is no history of Colon cancer, Colon polyps, Esophageal cancer, Stomach cancer, or Rectal cancer.    ROS:  Please see the history of present illness.  All other ROS reviewed and negative.     Physical Exam/Data:   Vitals:   06/02/22 1304 06/02/22 1350 06/02/22 1420 06/02/22 1456  BP: 100/81 106/77 112/80   Pulse: 66 63 (!) 53   Resp: '20 16 14   '$ Temp:    97.6 F (36.4 C)  TempSrc:    Oral  SpO2: 100% 100% 100%   Weight:      Height:        No intake or output data in the 24 hours ending 06/02/22 1608    06/02/2022    8:40 AM 02/10/2021    7:41 AM 01/27/2021    7:57 AM  Last 3 Weights  Weight (lbs) 165 lb 171 lb 171 lb  Weight (kg) 74.844 kg 77.565 kg 77.565 kg     Body mass index is 23.01 kg/m.  Examined by MD. General: Well developed, well nourished, in no acute distress. Head: Normocephalic, atraumatic, sclera non-icteric, no xanthomas, nares are without discharge. Neck: Negative for carotid bruits. JVP not elevated. Lungs: Clear bilaterally to auscultation without wheezes, rales, or rhonchi. Breathing is unlabored. Heart: RRR S1 S2 without murmurs, rubs, or gallops.  Abdomen: Soft, non-tender, non-distended with normoactive bowel sounds. No rebound/guarding. Extremities: No clubbing or cyanosis. No edema. Distal pedal pulses are 2+ and equal bilaterally. Neuro: Alert and oriented X 3. Moves all extremities spontaneously. Psych:  Responds to questions appropriately with a normal affect.  EKG:  The ECG that was done today was personally reviewed and demonstrates: NSR 82bpm no acute STT changes Repeat tracing nonacute  Relevant CV Studies: Cardiac CT today  ADDENDUM REPORT: 06/02/2022 15:25   CLINICAL DATA:  66 year old with chest pain   EXAM: Cardiac/Coronary  CTA   TECHNIQUE: The patient was scanned on a [employer].   FINDINGS: A 120 kV prospective scan was triggered in the descending thoracic aorta at 111 HU's. Axial non-contrast 3 mm slices were carried out through the heart. The data set was analyzed on a dedicated work station and scored using the [place]. Gantry rotation speed was 250 msecs and collimation was .6 mm. 0.8 mg of sl NTG was given. The 3D data set was reconstructed in 5% intervals of the 67-82 % of the R-R cycle. Diastolic phases were analyzed on a dedicated work station using MPR, MIP and VRT modes. The patient received 80 cc of contrast.   Image quality: good    Aorta:  Normal size.  No calcifications.  No dissection.   Aortic Valve: No calcifications.   Coronary Arteries:  Normal coronary origin.  Right dominance.   RCA is a large dominant artery that gives rise to PDA and PLA. There is scattered calcified plaque, 50-69% stenosis prox/mid. Mild 30-49% distal.   Left main is a large artery that gives rise to LAD and LCX arteries. There is 50% stenosis distal left main.   LAD is a large vessel that has scattered calcified and non calcified plaque. There is severe 70-99% stenosis proximally, mild 30-49% stenosis distally.   LCX is a non-dominant artery that gives  rise to one large OM1 branch. There is scattered calcified plaque. Moderate 50-69% stenosis prox/mid circ.   Other findings:   Normal pulmonary vein drainage into the left atrium.   Normal left atrial appendage without a thrombus.   Normal size of the pulmonary artery.   Please see radiology report for non cardiac findings.   IMPRESSION: 1. Coronary calcium score of 1659. This was 100 percentile for age and sex matched control.   2. Normal coronary origin with right dominance.   3. Distal Left main 50% stenosis.   4. Severe proximal LAD stenosis 70-99%. FFR 0.75 post lesion, 0.67 distal. Abnormal flow.   5. Moderate RCA and LCX stenosis 50-69%.  (FFR negative)   CAD-RADS 4 Severe stenosis. (70-99% or > 50% left main). Cardiac catheterization is recommended. Consider symptom-guided anti-ischemic pharmacotherapy as well as risk factor modification per guideline directed care.     Electronically Signed   By: Candee Furbish M.D.   On: 06/02/2022 15:25   EXAM: OVER-READ INTERPRETATION  CT CHEST   The following report is a limited chest CT over-read performed by radiologist Dr. Yetta Glassman of [facility], [place] on 06/02/2022. This over-read does not include interpretation of cardiac or coronary anatomy or pathology. The cardiac CTA interpretation by the  cardiologist is attached.   COMPARISON:  None Available.   FINDINGS: Vascular: Normal heart size. No pericardial effusion. No suspicious filling defects of the central pulmonary arteries.   Mediastinum/Nodes: Esophagus is unremarkable. No pathologically enlarged lymph nodes seen in the chest.   Lungs/Pleura: Lungs are clear. No pleural effusion or pneumothorax.   Upper Abdomen: No acute abnormality.   Musculoskeletal: No chest wall mass or suspicious bone lesions identified.   IMPRESSION: No significant extracardiac abnormality.   Electronically Signed: By: Yetta Glassman M.D. On: 06/02/2022 14:53    FINDINGS: FFRct analysis was performed on the original cardiac CT angiogram dataset. Diagrammatic representation of the FFRct analysis is provided in a separate PDF document in PACS. This dictation was created using the PDF document and an interactive 3D model of the results. 3D model is not available in the EMR/PACS. Normal FFR range is >0.80.   1. Left Main: 1.0 to 0.95   2. LAD: 0.95 to 0.75 proximal to mid, 0.67 distal   3. LCX: 0.9 mid, 0.79 distal   4. Ramus: NA   5. RCA: 1.0 to 0.91   IMPRESSION: 1.  Abnormal FFR LAD suggestive of flow limiting CAD   2.  Recommend cardiac catheterization.   Note: These examples are not recommendations of Heart[employer] and only provided as examples of what other customers are doing.     Electronically Signed   By: Candee Furbish M.D.   On: 06/02/2022 15:34      Laboratory Data:  High Sensitivity Troponin:   Recent Labs  Lab 06/02/22 0846 06/02/22 1043  TROPONINIHS 5 4      Chemistry Recent Labs  Lab 06/02/22 0846  NA 137  K 4.4  CL 103  CO2 27  GLUCOSE 108*  BUN 16  CREATININE 1.31*  CALCIUM 9.4  GFRNONAA 59*  ANIONGAP 7    No results for input(s): "PROT", "ALBUMIN", "AST", "ALT", "ALKPHOS", "BILITOT" in the last 168 hours. Lipids No results for input(s): "CHOL", "TRIG", "HDL", "LABVLDL", "LDLCALC",  "CHOLHDL" in the last 168 hours. Hematology Recent Labs  Lab 06/02/22 0846  WBC 5.7  RBC 4.82  HGB 15.9  HCT 46.5  MCV 96.5  MCH 33.0  MCHC 34.2  RDW 13.1  PLT 213   Thyroid No results for input(s): "TSH", "FREET4" in the last 168 hours. BNPNo results for input(s): "BNP", "PROBNP" in the last 168 hours.  DDimer No results for input(s): "DDIMER" in the last 168 hours.   Radiology/Studies:  CT CORONARY FRACTIONAL FLOW RESERVE DATA PREP  Result Date: 06/02/2022 EXAM: FFRCT ANALYSIS FINDINGS: FFRct analysis was performed on the original cardiac CT angiogram dataset. Diagrammatic representation of the FFRct analysis is provided in a separate PDF document in PACS. This dictation was created using the PDF document and an interactive 3D model of the results. 3D model is not available in the EMR/PACS. Normal FFR range is >0.80. 1. Left Main: 1.0 to 0.95 2. LAD: 0.95 to 0.75 proximal to mid, 0.67 distal 3. LCX: 0.9 mid, 0.79 distal 4. Ramus: NA 5. RCA: 1.0 to 0.91 IMPRESSION: 1.  Abnormal FFR LAD suggestive of flow limiting CAD 2.  Recommend cardiac catheterization. Note: These examples are not recommendations of Heart[employer] and only provided as examples of what other customers are doing. Electronically Signed   By: Candee Furbish M.D.   On: 06/02/2022 15:34   CT CORONARY FRACTIONAL FLOW RESERVE FLUID ANALYSIS  Result Date: 06/02/2022 EXAM: FFRCT ANALYSIS FINDINGS: FFRct analysis was performed on the original cardiac CT angiogram dataset. Diagrammatic representation of the FFRct analysis is provided in a separate PDF document in PACS. This dictation was created using the PDF document and an interactive 3D model of the results. 3D model is not available in the EMR/PACS. Normal FFR range is >0.80. 1. Left Main: 1.0 to 0.95 2. LAD: 0.95 to 0.75 proximal to mid, 0.67 distal 3. LCX: 0.9 mid, 0.79 distal 4. Ramus: NA 5. RCA: 1.0 to 0.91 IMPRESSION: 1.  Abnormal FFR LAD suggestive of flow limiting CAD 2.   Recommend cardiac catheterization. Note: These examples are not recommendations of HeartFlow and only provided as examples of what other customers are doing. Electronically Signed   By: Candee Furbish M.D.   On: 06/02/2022 15:34   CT CORONARY MORPH W/CTA COR W/SCORE W/CA W/CM &/OR WO/CM  Addendum Date: 06/02/2022   ADDENDUM REPORT: 06/02/2022 15:25 CLINICAL DATA:  66 year old with chest pain EXAM: Cardiac/Coronary  CTA TECHNIQUE: The patient was scanned on a [employer]. FINDINGS: A 120 kV prospective scan was triggered in the descending thoracic aorta at 111 HU's. Axial non-contrast 3 mm slices were carried out through the heart. The data set was analyzed on a dedicated work station and scored using the [place]. Gantry rotation speed was 250 msecs and collimation was .6 mm. 0.8 mg of sl NTG was given. The 3D data set was reconstructed in 5% intervals of the 67-82 % of the R-R cycle. Diastolic phases were analyzed on a dedicated work station using MPR, MIP and VRT modes. The patient received 80 cc of contrast. Image quality: good Aorta:  Normal size.  No calcifications.  No dissection. Aortic Valve: No calcifications. Coronary Arteries:  Normal coronary origin.  Right dominance. RCA is a large dominant artery that gives rise to PDA and PLA. There is scattered calcified plaque, 50-69% stenosis prox/mid. Mild 30-49% distal. Left main is a large artery that gives rise to LAD and LCX arteries. There is 50% stenosis distal left main. LAD is a large vessel that has scattered calcified and non calcified plaque. There is severe 70-99% stenosis proximally, mild 30-49% stenosis distally. LCX is a non-dominant artery that gives rise to one large OM1 branch. There  is scattered calcified plaque. Moderate 50-69% stenosis prox/mid circ. Other findings: Normal pulmonary vein drainage into the left atrium. Normal left atrial appendage without a thrombus. Normal size of the pulmonary artery. Please see radiology  report for non cardiac findings. IMPRESSION: 1. Coronary calcium score of 1659. This was 53 percentile for age and sex matched control. 2. Normal coronary origin with right dominance. 3. Distal Left main 50% stenosis. 4. Severe proximal LAD stenosis 70-99%. FFR 0.75 post lesion, 0.67 distal. Abnormal flow. 5. Moderate RCA and LCX stenosis 50-69%.  (FFR negative) CAD-RADS 4 Severe stenosis. (70-99% or > 50% left main). Cardiac catheterization is recommended. Consider symptom-guided anti-ischemic pharmacotherapy as well as risk factor modification per guideline directed care. Electronically Signed   By: Candee Furbish M.D.   On: 06/02/2022 15:25   Result Date: 06/02/2022 EXAM: OVER-READ INTERPRETATION  CT CHEST The following report is a limited chest CT over-read performed by radiologist Dr. Yetta Glassman of [facility], [place] on 06/02/2022. This over-read does not include interpretation of cardiac or coronary anatomy or pathology. The cardiac CTA interpretation by the cardiologist is attached. COMPARISON:  None Available. FINDINGS: Vascular: Normal heart size. No pericardial effusion. No suspicious filling defects of the central pulmonary arteries. Mediastinum/Nodes: Esophagus is unremarkable. No pathologically enlarged lymph nodes seen in the chest. Lungs/Pleura: Lungs are clear. No pleural effusion or pneumothorax. Upper Abdomen: No acute abnormality. Musculoskeletal: No chest wall mass or suspicious bone lesions identified. IMPRESSION: No significant extracardiac abnormality. Electronically Signed: By: Yetta Glassman M.D. On: 06/02/2022 14:53   DG Chest 2 View  Result Date: 06/02/2022 CLINICAL DATA:  Chest pain. EXAM: CHEST - 2 VIEW COMPARISON:  None Available. FINDINGS: Both lungs are clear. Heart and mediastinum are within normal limits. Trachea is midline. No large pleural effusions. No acute bone abnormality. Negative for a pneumothorax. IMPRESSION: No active cardiopulmonary disease.  Electronically Signed   By: Markus Daft M.D.   On: 06/02/2022 09:03     Assessment and Plan:   1. Chest pain/back pain/possible unstable angina - chest pain has mixed typical/atypical features - despite persistence of pain, initial troponins negative and EKGs nonacute, though coronary CTA suggestive of possible obstructive CAD. No aortic dissection seen. He is not describing any pleuritic discomfort or SOB. He is not hypoxic or tachycardic. Per Dr. Johney Frame, will enact the following plan: - cath Monday preliminarily scheduled 7:30am with Dr. Tamala Julian - Dr. Johney Frame held off discussing risks/benefits at this time so as not to overwhelm the patient - please discuss over the weekend. Will need pre-cath orders with informed consent order once this has been discussed - check echocardiogram - continue heparin per pharmacy as already ordered by ER - will try IV morphine for residual pain (last BP 130s/80s) - hold off IV NTG given headache, can revisit if pain persists - re-cycle troponins - start ASA '81mg'$  daily starting tomorrow - got '324mg'$  this AM - hold off BB given baseline sinus bradycardia - start rosuvastatin '40mg'$  daily - lipid panel in AM - if unable to get pain free, may need cath sooner - will be signing out to call team to f/u to check on how he is doing in the next 1-2 hours   2. Renal insufficiency of unknown chronicity - Cr 1.31 - gently hydrate with 100cc/hr x 10 hours - f/u daily BMET - consider renal US if remains up  3. Family hx of premature CAD - check lipids   Risk Assessment/Risk Scores:    Chest pain  HEAR score not accessible.   Severity of Illness: The appropriate patient status for this patient is INPATIENT. Inpatient status is judged to be reasonable and necessary in order to provide the required intensity of service to ensure the patient's safety. The patient's presenting symptoms, physical exam findings, and initial radiographic and laboratory data in the context  of their chronic comorbidities is felt to place them at high risk for further clinical deterioration. Furthermore, it is not anticipated that the patient will be medically stable for discharge from the hospital within 2 midnights of admission.   * I certify that at the point of admission it is my clinical judgment that the patient will require inpatient hospital care spanning beyond 2 midnights from the point of admission due to high intensity of service, high risk for further deterioration and high frequency of surveillance required.*   For questions or updates, please contact [place] Please consult [email or web address] for contact info under     Signed, Charlie Pitter, PA-C  06/02/2022 4:08 PM   Patient seen and examined and agree with Melina Copa, PA-C as detailed above.  In brief, the patient is a 66 year old male with no significant past medical history who presented with atypical chest and back pain found to have FFR significant prox LAD stenosis, 50% LM disease, 50-69% RCA and LCX stenosis now admitted to Cardiology for cath.  Patient states that he initially developed sharp chest pain that lasted for about 10 seconds a few days ago while exercising at the gym. Symptoms resolved without intervention and he was able to play tennis the next day without issue. He woke up this morning, however, and developed recurrent chest pain radiating to his back prompting him to come to the ER.  In the ER, trop negative x2. ECG with NSR, no ischemic changes. He had persistent pain that did not improve with nitro. Given reassuring initial work-up, he was scheduled for coronary CTA which showed FFR significant prox LAD disease with 50% LM disease and moderate disease elsewhere prompting admission for cath.   Currently, the patient continues to have chest/back pain. Will start heparin gtt. Did not respond to nitro and has bad headache currently. Will give morphine for pain and cycle troponins/trend ECG. Plan for  cath Monday unless symptoms worsen/trops elevate/ECG changes.   GEN: No acute distress.   Neck: No JVD Cardiac: RRR, no murmurs, rubs, or gallops.  Respiratory: Clear to auscultation bilaterally. GI: Soft, nontender, non-distended  MS: No edema; No deformity. Neuro:  Nonfocal  Psych: Normal affect    Plan: -Plan for cath Monday -Check TTE -Start heparin gtt -Trial of morphine for back pain as did not respond to nitro and has HA currently -Trend trops and ECG -Start ASA '81mg'$  daily, crestor '40mg'$  daily -Not on BB due to baseline bradycaria -Can add ARB post-cath  Gwyndolyn Kaufman, MD

## 2022-06-02 NOTE — ED Notes (Signed)
Lab called about labs that were sent, states that blood has been received.

## 2022-06-02 NOTE — Progress Notes (Signed)
Progress Note  Patient Name: Mark Fitzgerald Date of Encounter: 06/03/2022  [facility] Cardiologist: None   Subjective   Feels better this morning. Chest/back pain significantly improved. No SOB.  Coronary CTA with 70-99% prox-LAD stenosis with +FFR. Now planned for cath.  Remains on heparin gtt  Inpatient Medications    Scheduled Meds:  aspirin EC  81 mg Oral Daily   multivitamin with minerals  1 tablet Oral Daily   rosuvastatin  40 mg Oral Daily   Continuous Infusions:  heparin 1,050 Units/hr (06/03/22 0034)   PRN Meds: acetaminophen, morphine injection, nitroGLYCERIN, ondansetron (ZOFRAN) IV   Vital Signs    Vitals:   06/03/22 0349 06/03/22 0500 06/03/22 0600 06/03/22 0700  BP:  105/70 115/73 100/69  Pulse:  60 (!) 54 (!) 59  Resp:  '14 13 14  '$ Temp: 98.3 F (36.8 C)     TempSrc: Oral     SpO2:  97% 98% 97%  Weight:      Height:       No intake or output data in the 24 hours ending 06/03/22 0755    06/02/2022    4:17 PM 06/02/2022    8:40 AM 02/10/2021    7:41 AM  Last 3 Weights  Weight (lbs) 165 lb 165 lb 171 lb  Weight (kg) 74.844 kg 74.844 kg 77.565 kg      Telemetry    NSR - Personally Reviewed  ECG    NSR, no ischemic changes - Personally Reviewed  Physical Exam   GEN: No acute distress.   Neck: No JVD Cardiac: RRR, no murmurs, rubs, or gallops.  Respiratory: Clear to auscultation bilaterally. GI: Soft, nontender, non-distended  MS: No edema; No deformity. Neuro:  Nonfocal  Psych: Normal affect   Labs    High Sensitivity Troponin:   Recent Labs  Lab 06/02/22 0846 06/02/22 1043 06/02/22 1617 06/02/22 2047  TROPONINIHS '5 4 5 5     '$ Chemistry Recent Labs  Lab 06/02/22 0846 06/02/22 2047  NA 137  --   K 4.4  --   CL 103  --   CO2 27  --   GLUCOSE 108*  --   BUN 16  --   CREATININE 1.31*  --   CALCIUM 9.4  --   PROT  --  6.2*  ALBUMIN  --  3.6  AST  --  25  ALT  --  19  ALKPHOS  --  40  BILITOT  --  0.9  GFRNONAA  59*  --   ANIONGAP 7  --     Lipids  Recent Labs  Lab 06/03/22 0254  CHOL 171  TRIG 74  HDL 53  LDLCALC 103*  CHOLHDL 3.2    Hematology Recent Labs  Lab 06/02/22 0846 06/03/22 0254  WBC 5.7 9.8  RBC 4.82 4.37  HGB 15.9 14.2  HCT 46.5 42.5  MCV 96.5 97.3  MCH 33.0 32.5  MCHC 34.2 33.4  RDW 13.1 13.2  PLT 213 178   Thyroid  Recent Labs  Lab 06/02/22 1636  TSH 1.529    BNPNo results for input(s): "BNP", "PROBNP" in the last 168 hours.  DDimer No results for input(s): "DDIMER" in the last 168 hours.   Radiology    CT CORONARY FRACTIONAL FLOW RESERVE DATA PREP  Result Date: 06/02/2022 EXAM: FFRCT ANALYSIS FINDINGS: FFRct analysis was performed on the original cardiac CT angiogram dataset. Diagrammatic representation of the FFRct analysis is provided in a separate PDF document  in PACS. This dictation was created using the PDF document and an interactive 3D model of the results. 3D model is not available in the EMR/PACS. Normal FFR range is >0.80. 1. Left Main: 1.0 to 0.95 2. LAD: 0.95 to 0.75 proximal to mid, 0.67 distal 3. LCX: 0.9 mid, 0.79 distal 4. Ramus: NA 5. RCA: 1.0 to 0.91 IMPRESSION: 1.  Abnormal FFR LAD suggestive of flow limiting CAD 2.  Recommend cardiac catheterization. Note: These examples are not recommendations of [employer] and only provided as examples of what other customers are doing. Electronically Signed   By: Candee Furbish M.D.   On: 06/02/2022 15:34   CT CORONARY FRACTIONAL FLOW RESERVE FLUID ANALYSIS  Result Date: 06/02/2022 EXAM: FFRCT ANALYSIS FINDINGS: FFRct analysis was performed on the original cardiac CT angiogram dataset. Diagrammatic representation of the FFRct analysis is provided in a separate PDF document in PACS. This dictation was created using the PDF document and an interactive 3D model of the results. 3D model is not available in the EMR/PACS. Normal FFR range is >0.80. 1. Left Main: 1.0 to 0.95 2. LAD: 0.95 to 0.75 proximal to mid, 0.67  distal 3. LCX: 0.9 mid, 0.79 distal 4. Ramus: NA 5. RCA: 1.0 to 0.91 IMPRESSION: 1.  Abnormal FFR LAD suggestive of flow limiting CAD 2.  Recommend cardiac catheterization. Note: These examples are not recommendations of Heart[employer] and only provided as examples of what other customers are doing. Electronically Signed   By: Candee Furbish M.D.   On: 06/02/2022 15:34   CT CORONARY MORPH W/CTA COR W/SCORE W/CA W/CM &/OR WO/CM  Addendum Date: 06/02/2022   ADDENDUM REPORT: 06/02/2022 15:25 CLINICAL DATA:  66 year old with chest pain EXAM: Cardiac/Coronary  CTA TECHNIQUE: The patient was scanned on a [employer]. FINDINGS: A 120 kV prospective scan was triggered in the descending thoracic aorta at 111 HU's. Axial non-contrast 3 mm slices were carried out through the heart. The data set was analyzed on a dedicated work station and scored using the [place]. Gantry rotation speed was 250 msecs and collimation was .6 mm. 0.8 mg of sl NTG was given. The 3D data set was reconstructed in 5% intervals of the 67-82 % of the R-R cycle. Diastolic phases were analyzed on a dedicated work station using MPR, MIP and VRT modes. The patient received 80 cc of contrast. Image quality: good Aorta:  Normal size.  No calcifications.  No dissection. Aortic Valve: No calcifications. Coronary Arteries:  Normal coronary origin.  Right dominance. RCA is a large dominant artery that gives rise to PDA and PLA. There is scattered calcified plaque, 50-69% stenosis prox/mid. Mild 30-49% distal. Left main is a large artery that gives rise to LAD and LCX arteries. There is 50% stenosis distal left main. LAD is a large vessel that has scattered calcified and non calcified plaque. There is severe 70-99% stenosis proximally, mild 30-49% stenosis distally. LCX is a non-dominant artery that gives rise to one large OM1 branch. There is scattered calcified plaque. Moderate 50-69% stenosis prox/mid circ. Other findings: Normal pulmonary vein  drainage into the left atrium. Normal left atrial appendage without a thrombus. Normal size of the pulmonary artery. Please see radiology report for non cardiac findings. IMPRESSION: 1. Coronary calcium score of 1659. This was 75 percentile for age and sex matched control. 2. Normal coronary origin with right dominance. 3. Distal Left main 50% stenosis. 4. Severe proximal LAD stenosis 70-99%. FFR 0.75 post lesion, 0.67 distal. Abnormal flow.  5. Moderate RCA and LCX stenosis 50-69%.  (FFR negative) CAD-RADS 4 Severe stenosis. (70-99% or > 50% left main). Cardiac catheterization is recommended. Consider symptom-guided anti-ischemic pharmacotherapy as well as risk factor modification per guideline directed care. Electronically Signed   By: Candee Furbish M.D.   On: 06/02/2022 15:25   Result Date: 06/02/2022 EXAM: OVER-READ INTERPRETATION  CT CHEST The following report is a limited chest CT over-read performed by radiologist Dr. Yetta Glassman of [facility], [place] on 06/02/2022. This over-read does not include interpretation of cardiac or coronary anatomy or pathology. The cardiac CTA interpretation by the cardiologist is attached. COMPARISON:  None Available. FINDINGS: Vascular: Normal heart size. No pericardial effusion. No suspicious filling defects of the central pulmonary arteries. Mediastinum/Nodes: Esophagus is unremarkable. No pathologically enlarged lymph nodes seen in the chest. Lungs/Pleura: Lungs are clear. No pleural effusion or pneumothorax. Upper Abdomen: No acute abnormality. Musculoskeletal: No chest wall mass or suspicious bone lesions identified. IMPRESSION: No significant extracardiac abnormality. Electronically Signed: By: Yetta Glassman M.D. On: 06/02/2022 14:53   DG Chest 2 View  Result Date: 06/02/2022 CLINICAL DATA:  Chest pain. EXAM: CHEST - 2 VIEW COMPARISON:  None Available. FINDINGS: Both lungs are clear. Heart and mediastinum are within normal limits. Trachea is midline. No  large pleural effusions. No acute bone abnormality. Negative for a pneumothorax. IMPRESSION: No active cardiopulmonary disease. Electronically Signed   By: Markus Daft M.D.   On: 06/02/2022 09:03    Cardiac Studies   Cardiac CT today   ADDENDUM REPORT: 06/02/2022 15:25   CLINICAL DATA:  66 year old with chest pain   EXAM: Cardiac/Coronary  CTA   TECHNIQUE: The patient was scanned on a [employer].   FINDINGS: A 120 kV prospective scan was triggered in the descending thoracic aorta at 111 HU's. Axial non-contrast 3 mm slices were carried out through the heart. The data set was analyzed on a dedicated work station and scored using the [place]. Gantry rotation speed was 250 msecs and collimation was .6 mm. 0.8 mg of sl NTG was given. The 3D data set was reconstructed in 5% intervals of the 67-82 % of the R-R cycle. Diastolic phases were analyzed on a dedicated work station using MPR, MIP and VRT modes. The patient received 80 cc of contrast.   Image quality: good   Aorta:  Normal size.  No calcifications.  No dissection.   Aortic Valve: No calcifications.   Coronary Arteries:  Normal coronary origin.  Right dominance.   RCA is a large dominant artery that gives rise to PDA and PLA. There is scattered calcified plaque, 50-69% stenosis prox/mid. Mild 30-49% distal.   Left main is a large artery that gives rise to LAD and LCX arteries. There is 50% stenosis distal left main.   LAD is a large vessel that has scattered calcified and non calcified plaque. There is severe 70-99% stenosis proximally, mild 30-49% stenosis distally.   LCX is a non-dominant artery that gives rise to one large OM1 branch. There is scattered calcified plaque. Moderate 50-69% stenosis prox/mid circ.   Other findings:   Normal pulmonary vein drainage into the left atrium.   Normal left atrial appendage without a thrombus.   Normal size of the pulmonary artery.   Please see  radiology report for non cardiac findings.   IMPRESSION: 1. Coronary calcium score of 1659. This was 24 percentile for age and sex matched control.   2. Normal coronary origin with right dominance.  3. Distal Left main 50% stenosis.   4. Severe proximal LAD stenosis 70-99%. FFR 0.75 post lesion, 0.67 distal. Abnormal flow.   5. Moderate RCA and LCX stenosis 50-69%.  (FFR negative)   CAD-RADS 4 Severe stenosis. (70-99% or > 50% left main). Cardiac catheterization is recommended. Consider symptom-guided anti-ischemic pharmacotherapy as well as risk factor modification per guideline directed care.  Patient Profile     66 y.o. male with no significant past medical history who presented with atypical chest and back pain found to have FFR significant prox LAD stenosis, 50% LM disease, 50-69% RCA and LCX stenosis now admitted to Cardiology for cath.  Assessment & Plan    #Chest pain/back pain/possible unstable angina -chest pain has mixed typical/atypical features - despite persistence of pain, initial troponins negative and EKGs nonacute, though coronary CTA suggestive of possible obstructive CAD in prox-LAD - planned for cath on Monday - check TTE - continue heparin gtt - continue ASA '81mg'$  daily - continue crestor '40mg'$  daily - morphine prn for pain; pain was not responsive to nitro - not on BB due to baseline bradycardia   #Renal insufficiency of unknown chronicity - Cr 1.31 - s/p IVF overnight - follow-up BMET   #Family hx of premature CAD - LDL 103, TG 74, HDL 74 - Will check NMR panel, Lp(a), apolipoprotein B as outpatient  For questions or updates, please contact [facility] Please consult [email or web address] for contact info under        Signed, Freada Bergeron, MD  06/03/2022, 7:55 AM

## 2022-06-02 NOTE — Progress Notes (Signed)
ANTICOAGULATION CONSULT NOTE - Initial Consult  Pharmacy Consult for heparin Indication: chest pain/ACS  No Known Allergies  Patient Measurements: Height: '5\' 11"'$  (180.3 cm) Weight: 74.8 kg (165 lb) IBW/kg (Calculated) : 75.3 Heparin Dosing Weight: 74.8 kg  Vital Signs: Temp: 97.6 F (36.4 C) (08/11 1456) Temp Source: Oral (08/11 1456) BP: 112/80 (08/11 1420) Pulse Rate: 53 (08/11 1420)  Labs: Recent Labs    06/02/22 0846 06/02/22 1043  HGB 15.9  --   HCT 46.5  --   PLT 213  --   CREATININE 1.31*  --   TROPONINIHS 5 4    Estimated Creatinine Clearance: 57.1 mL/min (A) (by C-G formula based on SCr of 1.31 mg/dL (H)).   Medical History: Past Medical History:  Diagnosis Date   Healthy adult on routine physical examination     Medications:  Scheduled:   heparin  4,000 Units Intravenous Once   Infusions:   heparin      Assessment: Patient presented with chest pain concerning for ACS/unstable angina. Pharmacy consulted to start IV heparin. Patient reports taking no anticoagulation prior to admission. CBC within normal limits with no concern for bleeding.  Goal of Therapy:  Heparin level 0.3-0.7 units/ml Monitor platelets by anticoagulation protocol: Yes   Plan:  Heparin 4000 units IV bolus once Start heparin 900 units/hr IV Check 6hr heparin level at 2300 Daily CBC and heparin level  Erskine Speed, PharmD 06/02/2022,4:21 PM

## 2022-06-02 NOTE — Progress Notes (Signed)
ANTICOAGULATION CONSULT NOTE   Pharmacy Consult for heparin Indication: chest pain/ACS  No Known Allergies  Patient Measurements: Height: '5\' 11"'$  (180.3 cm) Weight: 74.8 kg (165 lb) IBW/kg (Calculated) : 75.3 Heparin Dosing Weight: 74.8 kg  Vital Signs: Temp: 98 F (36.7 C) (08/11 1945) Temp Source: Oral (08/11 1945) BP: 117/77 (08/11 2300) Pulse Rate: 63 (08/11 2300)  Labs: Recent Labs    06/02/22 0846 06/02/22 1043 06/02/22 1617 06/02/22 2047 06/02/22 2310  HGB 15.9  --   --   --   --   HCT 46.5  --   --   --   --   PLT 213  --   --   --   --   LABPROT  --   --  14.0  --   --   INR  --   --  1.1  --   --   HEPARINUNFRC  --   --   --   --  0.13*  CREATININE 1.31*  --   --   --   --   TROPONINIHS '5 4 5 5  '$ --      Estimated Creatinine Clearance: 57.1 mL/min (A) (by C-G formula based on SCr of 1.31 mg/dL (H)).   Medical History: Past Medical History:  Diagnosis Date   Healthy adult on routine physical examination     Medications:  Scheduled:   [START ON 06/03/2022] aspirin EC  81 mg Oral Daily   [START ON 06/03/2022] multivitamin with minerals  1 tablet Oral Daily   nitroGLYCERIN       rosuvastatin  40 mg Oral Daily   Infusions:   sodium chloride 100 mL/hr at 06/02/22 1703   heparin 900 Units/hr (06/02/22 1707)    Assessment: Patient presented with chest pain concerning for ACS/unstable angina. Pharmacy consulted to start IV heparin. Patient reports taking no anticoagulation prior to admission. CBC within normal limits with no concern for bleeding.  8/11 PM update:  Heparin level low  Goal of Therapy:  Heparin level 0.3-0.7 units/ml Monitor platelets by anticoagulation protocol: Yes   Plan:  Heparin 2000 units IV re-bolus once Inc heparin to 1050 units/hr Heparin level with AM labs  Narda Bonds, PharmD, BCPS Clinical Pharmacist Phone: [phone]

## 2022-06-03 ENCOUNTER — Inpatient Hospital Stay (HOSPITAL_COMMUNITY): Payer: PPO

## 2022-06-03 DIAGNOSIS — I200 Unstable angina: Secondary | ICD-10-CM | POA: Diagnosis not present

## 2022-06-03 DIAGNOSIS — R079 Chest pain, unspecified: Secondary | ICD-10-CM | POA: Diagnosis not present

## 2022-06-03 LAB — LIPID PANEL
Cholesterol: 171 mg/dL (ref 0–200)
HDL: 53 mg/dL (ref >40)
LDL Cholesterol: 103 mg/dL — ABNORMAL HIGH (ref 0–99)
Total CHOL/HDL Ratio: 3.2 RATIO
Triglycerides: 74 mg/dL (ref <150)
VLDL: 15 mg/dL (ref 0–40)

## 2022-06-03 LAB — CBC
HCT: 42.5 % (ref 39.0–52.0)
Hemoglobin: 14.2 g/dL (ref 13.0–17.0)
MCH: 32.5 pg (ref 26.0–34.0)
MCHC: 33.4 g/dL (ref 30.0–36.0)
MCV: 97.3 fL (ref 80.0–100.0)
Platelets: 178 10*3/uL (ref 150–400)
RBC: 4.37 MIL/uL (ref 4.22–5.81)
RDW: 13.2 % (ref 11.5–15.5)
WBC: 9.8 10*3/uL (ref 4.0–10.5)
nRBC: 0 % (ref 0.0–0.2)

## 2022-06-03 LAB — BASIC METABOLIC PANEL
Anion gap: 7 (ref 5–15)
BUN: 14 mg/dL (ref 8–23)
CO2: 21 mmol/L — ABNORMAL LOW (ref 22–32)
Calcium: 8.6 mg/dL — ABNORMAL LOW (ref 8.9–10.3)
Chloride: 108 mmol/L (ref 98–111)
Creatinine, Ser: 1.15 mg/dL (ref 0.61–1.24)
GFR, Estimated: >60 mL/min (ref >60)
Glucose, Bld: 110 mg/dL — ABNORMAL HIGH (ref 70–99)
Potassium: 4.1 mmol/L (ref 3.5–5.1)
Sodium: 136 mmol/L (ref 135–145)

## 2022-06-03 LAB — ECHOCARDIOGRAM COMPLETE
Area-P 1/2: 3.03 cm2
Height: 71 in
S' Lateral: 2.6 cm
Weight: 2694.9 oz

## 2022-06-03 LAB — HEPARIN LEVEL (UNFRACTIONATED)
Heparin Unfractionated: 0.27 IU/mL — ABNORMAL LOW (ref 0.30–0.70)
Heparin Unfractionated: 0.16 IU/mL — ABNORMAL LOW (ref 0.30–0.70)

## 2022-06-03 MED ORDER — TAMSULOSIN HCL 0.4 MG PO CAPS
0.4000 mg | ORAL_CAPSULE | Freq: Every day | ORAL | Status: DC
  Administered 2022-06-04 – 2022-06-06 (×3): 0.4 mg via ORAL
  Filled 2022-06-03 (×3): qty 1

## 2022-06-03 MED ORDER — ANGIOPLASTY BOOK
Freq: Once | Status: AC
Start: 2022-06-03 — End: 2022-06-03
  Filled 2022-06-03: qty 1

## 2022-06-03 MED ORDER — TRAMADOL HCL 50 MG PO TABS
50.0000 mg | ORAL_TABLET | Freq: Four times a day (QID) | ORAL | Status: DC | PRN
  Administered 2022-06-03 (×2): 50 mg via ORAL
  Filled 2022-06-03 (×2): qty 1

## 2022-06-03 NOTE — Progress Notes (Addendum)
[place] for heparin Indication: chest pain/ACS  No Known Allergies  Patient Measurements: Height: '5\' 11"'$  (180.3 cm) Weight: 74.8 kg (165 lb) IBW/kg (Calculated) : 75.3 Heparin Dosing Weight: 74.8 kg  Vital Signs: Temp: 98.3 F (36.8 C) (08/12 0349) Temp Source: Oral (08/12 0349) BP: 100/69 (08/12 0700) Pulse Rate: 59 (08/12 0700)  Labs: Recent Labs    06/02/22 0846 06/02/22 1043 06/02/22 1617 06/02/22 2047 06/02/22 2310 06/03/22 0254  HGB 15.9  --   --   --   --  14.2  HCT 46.5  --   --   --   --  42.5  PLT 213  --   --   --   --  178  LABPROT  --   --  14.0  --   --   --   INR  --   --  1.1  --   --   --   HEPARINUNFRC  --   --   --   --  0.13* 0.27*  CREATININE 1.31*  --   --   --   --   --   TROPONINIHS '5 4 5 5  '$ --   --     Estimated Creatinine Clearance: 57.1 mL/min (A) (by C-G formula based on SCr of 1.31 mg/dL (H)).   Medical History: Past Medical History:  Diagnosis Date   Healthy adult on routine physical examination     Medications:  Scheduled:   aspirin EC  81 mg Oral Daily   multivitamin with minerals  1 tablet Oral Daily   rosuvastatin  40 mg Oral Daily   Infusions:   heparin 1,050 Units/hr (06/03/22 0034)    Assessment: Patient presented with chest pain concerning for ACS/unstable angina. Pharmacy consulted to start IV heparin. Patient reports taking no anticoagulation prior to admission. CBC within normal limits with no concern for bleeding.  Heparin level 0.19 - called lab (suppose to update chart with level as not reported out).   Goal of Therapy:  Heparin level 0.3-0.7 units/ml Monitor platelets by anticoagulation protocol: Yes   Plan:  Increase Heparin to 1150 units/hr  Repeat Heparin level in 6 hours  Sloan Leiter, PharmD, BCPS, BCCCP Clinical Pharmacist Please refer to [facility] for [place] numbers

## 2022-06-03 NOTE — ED Notes (Signed)
ED TO INPATIENT HANDOFF REPORT  ED Nurse Name and Phone #: Delsa Sale [phone]  S Name/Age/Gender Mark Fitzgerald 66 y.o. male Room/Bed: 042C/042C  Code Status   Code Status: Full Code  Home/SNF/Other Home Patient oriented to: self, place, time, and situation Is this baseline? Yes   Triage Complete: Triage complete  Chief Complaint Unstable angina ([place]) [I20.0]  Triage Note Reports chest pain that started a day or so ago and now having pain into his back.  Denies SOB dizziness, sweating, n/v  Reports this could be related to working out at the gym a couple days ago. s   Allergies No Known Allergies  Level of Care/Admitting Diagnosis ED Disposition     ED Disposition  Admit   Condition  --   [place]: [facility] [[id number]]  Level of Care: Progressive [102]  Admit to Progressive based on following criteria: CARDIOVASCULAR & THORACIC of moderate stability with acute coronary syndrome symptoms/low risk myocardial infarction/hypertensive urgency/arrhythmias/heart failure potentially compromising stability and stable post cardiovascular intervention patients.  May admit patient to [facility] or [facility] if equivalent level of care is available:: No  Covid Evaluation: Asymptomatic - no recent exposure (last 10 days) testing not required  Diagnosis: Unstable angina [facility]) [[id number]]  Admitting Physician: Freada Bergeron [[id number]]  Attending Physician: Freada Bergeron [[id number]]  Certification:: I certify this patient will need inpatient services for at least 2 midnights  Estimated Length of Stay: 5          B Medical/Surgery History Past Medical History:  Diagnosis Date   Healthy adult on routine physical examination    Past Surgical History:  Procedure Laterality Date   BACK SURGERY     bicep surgery Right    shoulder   COLONOSCOPY     WRIST SURGERY Right      A IV Location/Drains/Wounds Patient Lines/Drains/Airways Status      Active Line/Drains/Airways     Name Placement date Placement time Site Days   Peripheral IV 06/02/22 20 G 1.16" Left Antecubital 06/02/22  1241  Antecubital  1   Peripheral IV 06/02/22 18 G 1.16" Right Antecubital 06/02/22  1258  Antecubital  1            Intake/Output Last 24 hours No intake or output data in the 24 hours ending 06/03/22 1108  Labs/Imaging Results for orders placed or performed during the hospital encounter of 06/02/22 (from the past 48 hour(s))  Basic metabolic panel     Status: Abnormal   Collection Time: 06/02/22  8:46 AM  Result Value Ref Range   Sodium 137 135 - 145 mmol/L   Potassium 4.4 3.5 - 5.1 mmol/L   Chloride 103 98 - 111 mmol/L   CO2 27 22 - 32 mmol/L   Glucose, Bld 108 (H) 70 - 99 mg/dL    Comment: Glucose reference range applies only to samples taken after fasting for at least 8 hours.   BUN 16 8 - 23 mg/dL   Creatinine, Ser 1.31 (H) 0.61 - 1.24 mg/dL   Calcium 9.4 8.9 - 10.3 mg/dL   GFR, Estimated 59 (L) >60 mL/min    Comment: (NOTE) Calculated using the CKD-EPI Creatinine Equation (2021)    Anion gap 7 5 - 15    Comment: Performed at [facility] [address], [place], [place] [address]  CBC     Status: None   Collection Time: 06/02/22  8:46 AM  Result Value Ref  Range   WBC 5.7 4.0 - 10.5 K/uL   RBC 4.82 4.22 - 5.81 MIL/uL   Hemoglobin 15.9 13.0 - 17.0 g/dL   HCT 46.5 39.0 - 52.0 %   MCV 96.5 80.0 - 100.0 fL   MCH 33.0 26.0 - 34.0 pg   MCHC 34.2 30.0 - 36.0 g/dL   RDW 13.1 11.5 - 15.5 %   Platelets 213 150 - 400 K/uL   nRBC 0.0 0.0 - 0.2 %    Comment: Performed at [facility], [place] [address], [place], [place] [address]  Troponin I (High Sensitivity)     Status: None   Collection Time: 06/02/22  8:46 AM  Result Value Ref Range   Troponin I (High Sensitivity) 5 <18 ng/L    Comment: (NOTE) Elevated high sensitivity troponin I (hsTnI) values and significant  changes across serial measurements may suggest ACS  but many other  chronic and acute conditions are known to elevate hsTnI results.  Refer to the "Links" section for chest pain algorithms and additional  guidance. Performed at [facility], [place] [address], [place], [place] [address]   Troponin I (High Sensitivity)     Status: None   Collection Time: 06/02/22 10:43 AM  Result Value Ref Range   Troponin I (High Sensitivity) 4 <18 ng/L    Comment: (NOTE) Elevated high sensitivity troponin I (hsTnI) values and significant  changes across serial measurements may suggest ACS but many other  chronic and acute conditions are known to elevate hsTnI results.  Refer to the "Links" section for chest pain algorithms and additional  guidance. Performed at [facility], [place] [address], [place], [place] [address]   Troponin I (High Sensitivity)     Status: None   Collection Time: 06/02/22  4:17 PM  Result Value Ref Range   Troponin I (High Sensitivity) 5 <18 ng/L    Comment: (NOTE) Elevated high sensitivity troponin I (hsTnI) values and significant  changes across serial measurements may suggest ACS but many other  chronic and acute conditions are known to elevate hsTnI results.  Refer to the Links section for chest pain algorithms and additional  guidance. Performed at [facility], [place] [address] [place], [place] [address]   Protime-INR     Status: None   Collection Time: 06/02/22  4:17 PM  Result Value Ref Range   Prothrombin Time 14.0 11.4 - 15.2 seconds   INR 1.1 0.8 - 1.2    Comment: (NOTE) INR goal varies based on device and disease states. Performed at [facility], [place] [address] [place], [place] [address]   HIV Antibody (routine testing w rflx)     Status: None   Collection Time: 06/02/22  4:17 PM  Result Value Ref Range   HIV Screen 4th Generation wRfx Non Reactive Non Reactive    Comment: Performed at [facility], [place] [address] [place], [place] [address]  TSH     Status: None    Collection Time: 06/02/22  4:36 PM  Result Value Ref Range   TSH 1.529 0.350 - 4.500 uIU/mL    Comment: Performed by a 3rd Generation assay with a functional sensitivity of <=0.01 uIU/mL. Performed at [facility], [place] [address] [place], [place] [address]   Troponin I (High Sensitivity)     Status: None   Collection Time: 06/02/22  8:47 PM  Result Value Ref Range   Troponin I (High Sensitivity) 5 <  18 ng/L    Comment: (NOTE) Elevated high sensitivity troponin I (hsTnI) values and significant  changes across serial measurements may suggest ACS but many other  chronic and acute conditions are known to elevate hsTnI results.  Refer to the Links section for chest pain algorithms and additional  guidance. Performed at [facility], [place] [address], [place], [place] [address]   Hepatic function panel     Status: Abnormal   Collection Time: 06/02/22  8:47 PM  Result Value Ref Range   Total Protein 6.2 (L) 6.5 - 8.1 g/dL   Albumin 3.6 3.5 - 5.0 g/dL   AST 25 15 - 41 U/L   ALT 19 0 - 44 U/L   Alkaline Phosphatase 40 38 - 126 U/L   Total Bilirubin 0.9 0.3 - 1.2 mg/dL   Bilirubin, Direct <0.1 0.0 - 0.2 mg/dL   Indirect Bilirubin NOT CALCULATED 0.3 - 0.9 mg/dL    Comment: Performed at [facility] [address], [place], [place] [address]  Heparin level (unfractionated)     Status: Abnormal   Collection Time: 06/02/22 11:10 PM  Result Value Ref Range   Heparin Unfractionated 0.13 (L) 0.30 - 0.70 IU/mL    Comment: (NOTE) The clinical reportable range upper limit is being lowered to >1.10 to align with the FDA approved guidance for the current laboratory assay.  If heparin results are below expected values, and patient dosage has  been confirmed, suggest follow up testing of antithrombin III levels. Performed at [facility], [place] [address] [place] [address]   CBC     Status: None   Collection Time: 06/03/22  2:54 AM  Result Value Ref Range   WBC  9.8 4.0 - 10.5 K/uL   RBC 4.37 4.22 - 5.81 MIL/uL   Hemoglobin 14.2 13.0 - 17.0 g/dL   HCT 42.5 39.0 - 52.0 %   MCV 97.3 80.0 - 100.0 fL   MCH 32.5 26.0 - 34.0 pg   MCHC 33.4 30.0 - 36.0 g/dL   RDW 13.2 11.5 - 15.5 %   Platelets 178 150 - 400 K/uL   nRBC 0.0 0.0 - 0.2 %    Comment: Performed at [facility], [place] [address], [place],  [address]  Lipid panel     Status: Abnormal   Collection Time: 06/03/22  2:54 AM  Result Value Ref Range   Cholesterol 171 0 - 200 mg/dL   Triglycerides 74 <150 mg/dL   HDL 53 >40 mg/dL   Total CHOL/HDL Ratio 3.2 RATIO   VLDL 15 0 - 40 mg/dL   LDL Cholesterol 103 (H) 0 - 99 mg/dL    Comment:        Total Cholesterol/HDL:CHD Risk Coronary Heart Disease Risk Table                     Men   Women  1/2 Average Risk   3.4   3.3  Average Risk       5.0   4.4  2 X Average Risk   9.6   7.1  3 X Average Risk  23.4   11.0        Use the calculated Patient Ratio above and the CHD Risk Table to determine the patient's CHD Risk.        ATP III CLASSIFICATION (LDL):  <100     mg/dL   Optimal  100-129  mg/dL   Near or  Above                    Optimal  130-159  mg/dL   Borderline  160-189  mg/dL   High  >190     mg/dL   Very High Performed at [facility] [address], [place], [place] [address]   Heparin level (unfractionated)     Status: Abnormal   Collection Time: 06/03/22  2:54 AM  Result Value Ref Range   Heparin Unfractionated 0.27 (L) 0.30 - 0.70 IU/mL    Comment: (NOTE) The clinical reportable range upper limit is being lowered to >1.10 to align with the FDA approved guidance for the current laboratory assay.  If heparin results are below expected values, and patient dosage has  been confirmed, suggest follow up testing of antithrombin III levels. Performed at [facility], [place] [address], [place], [place] [address]   Basic metabolic panel     Status: Abnormal   Collection Time: 06/03/22  9:23 AM  Result Value  Ref Range   Sodium 136 135 - 145 mmol/L   Potassium 4.1 3.5 - 5.1 mmol/L   Chloride 108 98 - 111 mmol/L   CO2 21 (L) 22 - 32 mmol/L   Glucose, Bld 110 (H) 70 - 99 mg/dL    Comment: Glucose reference range applies only to samples taken after fasting for at least 8 hours.   BUN 14 8 - 23 mg/dL   Creatinine, Ser 1.15 0.61 - 1.24 mg/dL   Calcium 8.6 (L) 8.9 - 10.3 mg/dL   GFR, Estimated >60 >60 mL/min    Comment: (NOTE) Calculated using the CKD-EPI Creatinine Equation (2021)    Anion gap 7 5 - 15    Comment: Performed at [facility] [address] [place], [place] [address]   CT CORONARY FRACTIONAL FLOW RESERVE DATA PREP  Result Date: 06/02/2022 EXAM: FFRCT ANALYSIS FINDINGS: FFRct analysis was performed on the original cardiac CT angiogram dataset. Diagrammatic representation of the FFRct analysis is provided in a separate PDF document in PACS. This dictation was created using the PDF document and an interactive 3D model of the results. 3D model is not available in the EMR/PACS. Normal FFR range is >0.80. 1. Left Main: 1.0 to 0.95 2. LAD: 0.95 to 0.75 proximal to mid, 0.67 distal 3. LCX: 0.9 mid, 0.79 distal 4. Ramus: NA 5. RCA: 1.0 to 0.91 IMPRESSION: 1.  Abnormal FFR LAD suggestive of flow limiting CAD 2.  Recommend cardiac catheterization. Note: These examples are not recommendations of [employer] and only provided as examples of what other customers are doing. Electronically Signed   By: Candee Furbish M.D.   On: 06/02/2022 15:34   CT CORONARY FRACTIONAL FLOW RESERVE FLUID ANALYSIS  Result Date: 06/02/2022 EXAM: FFRCT ANALYSIS FINDINGS: FFRct analysis was performed on the original cardiac CT angiogram dataset. Diagrammatic representation of the FFRct analysis is provided in a separate PDF document in PACS. This dictation was created using the PDF document and an interactive 3D model of the results. 3D model is not available in the EMR/PACS. Normal FFR range is >0.80. 1. Left Main: 1.0  to 0.95 2. LAD: 0.95 to 0.75 proximal to mid, 0.67 distal 3. LCX: 0.9 mid, 0.79 distal 4. Ramus: NA 5. RCA: 1.0 to 0.91 IMPRESSION: 1.  Abnormal FFR LAD suggestive of flow limiting CAD 2.  Recommend cardiac catheterization. Note: These examples are not recommendations of HeartFlow and only provided as examples of what  other customers are doing. Electronically Signed   By: Candee Furbish M.D.   On: 06/02/2022 15:34   CT CORONARY MORPH W/CTA COR W/SCORE W/CA W/CM &/OR WO/CM  Addendum Date: 06/02/2022   ADDENDUM REPORT: 06/02/2022 15:25 CLINICAL DATA:  66 year old with chest pain EXAM: Cardiac/Coronary  CTA TECHNIQUE: The patient was scanned on a [employer]. FINDINGS: A 120 kV prospective scan was triggered in the descending thoracic aorta at 111 HU's. Axial non-contrast 3 mm slices were carried out through the heart. The data set was analyzed on a dedicated work station and scored using the [place]. Gantry rotation speed was 250 msecs and collimation was .6 mm. 0.8 mg of sl NTG was given. The 3D data set was reconstructed in 5% intervals of the 67-82 % of the R-R cycle. Diastolic phases were analyzed on a dedicated work station using MPR, MIP and VRT modes. The patient received 80 cc of contrast. Image quality: good Aorta:  Normal size.  No calcifications.  No dissection. Aortic Valve: No calcifications. Coronary Arteries:  Normal coronary origin.  Right dominance. RCA is a large dominant artery that gives rise to PDA and PLA. There is scattered calcified plaque, 50-69% stenosis prox/mid. Mild 30-49% distal. Left main is a large artery that gives rise to LAD and LCX arteries. There is 50% stenosis distal left main. LAD is a large vessel that has scattered calcified and non calcified plaque. There is severe 70-99% stenosis proximally, mild 30-49% stenosis distally. LCX is a non-dominant artery that gives rise to one large OM1 branch. There is scattered calcified plaque. Moderate 50-69% stenosis  prox/mid circ. Other findings: Normal pulmonary vein drainage into the left atrium. Normal left atrial appendage without a thrombus. Normal size of the pulmonary artery. Please see radiology report for non cardiac findings. IMPRESSION: 1. Coronary calcium score of 1659. This was 84 percentile for age and sex matched control. 2. Normal coronary origin with right dominance. 3. Distal Left main 50% stenosis. 4. Severe proximal LAD stenosis 70-99%. FFR 0.75 post lesion, 0.67 distal. Abnormal flow. 5. Moderate RCA and LCX stenosis 50-69%.  (FFR negative) CAD-RADS 4 Severe stenosis. (70-99% or > 50% left main). Cardiac catheterization is recommended. Consider symptom-guided anti-ischemic pharmacotherapy as well as risk factor modification per guideline directed care. Electronically Signed   By: Candee Furbish M.D.   On: 06/02/2022 15:25   Result Date: 06/02/2022 EXAM: OVER-READ INTERPRETATION  CT CHEST The following report is a limited chest CT over-read performed by radiologist Dr. Yetta Glassman of [facility], [place] on 06/02/2022. This over-read does not include interpretation of cardiac or coronary anatomy or pathology. The cardiac CTA interpretation by the cardiologist is attached. COMPARISON:  None Available. FINDINGS: Vascular: Normal heart size. No pericardial effusion. No suspicious filling defects of the central pulmonary arteries. Mediastinum/Nodes: Esophagus is unremarkable. No pathologically enlarged lymph nodes seen in the chest. Lungs/Pleura: Lungs are clear. No pleural effusion or pneumothorax. Upper Abdomen: No acute abnormality. Musculoskeletal: No chest wall mass or suspicious bone lesions identified. IMPRESSION: No significant extracardiac abnormality. Electronically Signed: By: Yetta Glassman M.D. On: 06/02/2022 14:53   DG Chest 2 View  Result Date: 06/02/2022 CLINICAL DATA:  Chest pain. EXAM: CHEST - 2 VIEW COMPARISON:  None Available. FINDINGS: Both lungs are clear. Heart and mediastinum  are within normal limits. Trachea is midline. No large pleural effusions. No acute bone abnormality. Negative for a pneumothorax. IMPRESSION: No active cardiopulmonary disease. Electronically Signed   By: Markus Daft  M.D.   On: 06/02/2022 09:03    Pending Labs Unresulted Labs (From admission, onward)     Start     Ordered   06/03/22 1600  Heparin level (unfractionated)  Once-Timed,   TIMED        06/03/22 0925   06/03/22 7829  Basic metabolic panel  Daily at 5am,   R      06/03/22 0757   06/03/22 0500  CBC  Daily at 5am,   R      06/02/22 1620   06/03/22 0500  Lipoprotein A (LPA)  Tomorrow morning,   R        06/02/22 2050   Signed and Held  CBC  Tomorrow morning,   R        Signed and Held            Vitals/Pain Today's Vitals   06/03/22 0600 06/03/22 0700 06/03/22 0911 06/03/22 1030  BP: 115/73 100/69  109/70  Pulse: (!) 54 (!) 59  65  Resp: '13 14  10  '$ Temp:   98.1 F (36.7 C)   TempSrc:   Oral   SpO2: 98% 97%  97%  Weight:      Height:      PainSc:        Isolation Precautions No active isolations  Medications Medications  nitroGLYCERIN (NITROSTAT) SL tablet 0.4 mg (0.4 mg Sublingual Given 06/02/22 1234)  heparin ADULT infusion 100 units/mL (25000 units/269m) (1,150 Units/hr Intravenous Rate/Dose Change 06/03/22 1027)  morphine (PF) 2 MG/ML injection 2 mg (has no administration in time range)  0.9 %  sodium chloride infusion (0 mLs Intravenous Stopped 06/03/22 0349)  multivitamin with minerals tablet 1 tablet (1 tablet Oral Given 06/03/22 0908)  aspirin EC tablet 81 mg (81 mg Oral Given 06/03/22 0908)  acetaminophen (TYLENOL) tablet 650 mg (has no administration in time range)  ondansetron (ZOFRAN) injection 4 mg (has no administration in time range)  rosuvastatin (CRESTOR) tablet 40 mg (40 mg Oral Given 06/03/22 0908)  nitroGLYCERIN (NITROSTAT) 0.4 MG SL tablet (has no administration in time range)  traMADol (ULTRAM) tablet 50 mg (50 mg Oral Given 06/03/22 1025)   aspirin chewable tablet 324 mg (324 mg Oral Given 06/02/22 1230)  iohexol (OMNIPAQUE) 350 MG/ML injection 100 mL (100 mLs Intravenous Contrast Given 06/02/22 1412)  ketorolac (TORADOL) 15 MG/ML injection 15 mg (15 mg Intravenous Given 06/02/22 1618)  heparin bolus via infusion 4,000 Units (4,000 Units Intravenous Bolus from Bag 06/02/22 1707)  heparin bolus via infusion 2,000 Units (2,000 Units Intravenous Bolus from Bag 06/03/22 0035)    Mobility walks Low fall risk   Focused Assessments Cardiac Assessment Handoff:    No results found for: "CKTOTAL", "CKMB", "CKMBINDEX", "TROPONINI" No results found for: "DDIMER" Does the Patient currently have chest pain? Yes    R Recommendations: See Admitting Provider Note  Report given to:   Additional Notes:

## 2022-06-03 NOTE — ED Notes (Signed)
Breakfast order placed ?

## 2022-06-03 NOTE — Progress Notes (Signed)
  Echocardiogram 2D Echocardiogram has been performed.  Mark Fitzgerald 06/03/2022, 3:17 PM

## 2022-06-03 NOTE — Progress Notes (Signed)
ANTICOAGULATION CONSULT NOTE   Pharmacy Consult for heparin Indication: chest pain/ACS  No Known Allergies  Patient Measurements: Height: '5\' 11"'$  (180.3 cm) Weight: 76.4 kg (168 lb 6.9 oz) IBW/kg (Calculated) : 75.3 Heparin Dosing Weight: 74.8 kg  Vital Signs: Temp: 97.9 F (36.6 C) (08/12 1736) Temp Source: Oral (08/12 1736) BP: 117/73 (08/12 1736) Pulse Rate: 68 (08/12 1736)  Labs: Recent Labs    06/02/22 0846 06/02/22 1043 06/02/22 1617 06/02/22 2047 06/02/22 2310 06/03/22 0254 06/03/22 0923 06/03/22 1606  HGB 15.9  --   --   --   --  14.2  --   --   HCT 46.5  --   --   --   --  42.5  --   --   PLT 213  --   --   --   --  178  --   --   LABPROT  --   --  14.0  --   --   --   --   --   INR  --   --  1.1  --   --   --   --   --   HEPARINUNFRC  --   --   --   --  0.13* 0.27*  --  0.16*  CREATININE 1.31*  --   --   --   --   --  1.15  --   TROPONINIHS '5 4 5 5  '$ --   --   --   --      Estimated Creatinine Clearance: 65.5 mL/min (by C-G formula based on SCr of 1.15 mg/dL).   Medical History: Past Medical History:  Diagnosis Date   Healthy adult on routine physical examination     Medications:  Scheduled:   aspirin EC  81 mg Oral Daily   multivitamin with minerals  1 tablet Oral Daily   rosuvastatin  40 mg Oral Daily   Infusions:   heparin 1,150 Units/hr (06/03/22 1309)    Assessment: Patient presented with chest pain concerning for ACS/unstable angina. Pharmacy consulted to start IV heparin. Patient reports taking no anticoagulation prior to admission. CBC within normal limits with no concern for bleeding.  Heparin level still low at 0.16  Goal of Therapy:  Heparin level 0.3-0.7 units/ml Monitor platelets by anticoagulation protocol: Yes   Plan:  Increase Heparin to 1350 units/hr  Follow up AM heparin level Cath planned for Monday  Thank you Anette Guarneri, PharmD

## 2022-06-04 DIAGNOSIS — I200 Unstable angina: Secondary | ICD-10-CM | POA: Diagnosis not present

## 2022-06-04 LAB — CBC
HCT: 40.5 % (ref 39.0–52.0)
Hemoglobin: 14.3 g/dL (ref 13.0–17.0)
MCH: 33.5 pg (ref 26.0–34.0)
MCHC: 35.3 g/dL (ref 30.0–36.0)
MCV: 94.8 fL (ref 80.0–100.0)
Platelets: 166 10*3/uL (ref 150–400)
RBC: 4.27 MIL/uL (ref 4.22–5.81)
RDW: 13 % (ref 11.5–15.5)
WBC: 8.2 10*3/uL (ref 4.0–10.5)
nRBC: 0 % (ref 0.0–0.2)

## 2022-06-04 LAB — BASIC METABOLIC PANEL
Anion gap: 7 (ref 5–15)
BUN: 11 mg/dL (ref 8–23)
CO2: 25 mmol/L (ref 22–32)
Calcium: 8.7 mg/dL — ABNORMAL LOW (ref 8.9–10.3)
Chloride: 106 mmol/L (ref 98–111)
Creatinine, Ser: 1.09 mg/dL (ref 0.61–1.24)
GFR, Estimated: >60 mL/min (ref >60)
Glucose, Bld: 108 mg/dL — ABNORMAL HIGH (ref 70–99)
Potassium: 4 mmol/L (ref 3.5–5.1)
Sodium: 138 mmol/L (ref 135–145)

## 2022-06-04 LAB — HEPARIN LEVEL (UNFRACTIONATED)
Heparin Unfractionated: 0.27 IU/mL — ABNORMAL LOW (ref 0.30–0.70)
Heparin Unfractionated: 0.29 IU/mL — ABNORMAL LOW (ref 0.30–0.70)

## 2022-06-04 MED ORDER — SODIUM CHLORIDE 0.9 % WEIGHT BASED INFUSION: 1.0000 mL/kg/h | INTRAVENOUS | Status: DC

## 2022-06-04 MED ORDER — SODIUM CHLORIDE 0.9 % WEIGHT BASED INFUSION
3.0000 mL/kg/h | INTRAVENOUS | Status: DC
  Administered 2022-06-05: 3 mL/kg/h via INTRAVENOUS

## 2022-06-04 MED ORDER — ASPIRIN 81 MG PO CHEW
81.0000 mg | CHEWABLE_TABLET | ORAL | Status: AC
  Administered 2022-06-05: 81 mg via ORAL
  Filled 2022-06-04: qty 1

## 2022-06-04 NOTE — Progress Notes (Signed)
ANTICOAGULATION CONSULT NOTE- Follow Up  Pharmacy Consult for heparin Indication: chest pain/ACS  No Known Allergies  Patient Measurements: Height: '5\' 11"'$  (180.3 cm) Weight: 76.3 kg (168 lb 3.2 oz) IBW/kg (Calculated) : 75.3 Heparin Dosing Weight: 74.8 kg  Vital Signs: Temp: 98.7 F (37.1 C) (08/13 0527) Temp Source: Oral (08/13 0527) BP: 100/66 (08/13 0527) Pulse Rate: 92 (08/13 0527)  Labs: Recent Labs    06/02/22 0846 06/02/22 1043 06/02/22 1617 06/02/22 2047 06/02/22 2310 06/03/22 0254 06/03/22 0923 06/03/22 1606 06/04/22 0213  HGB 15.9  --   --   --   --  14.2  --   --  14.3  HCT 46.5  --   --   --   --  42.5  --   --  40.5  PLT 213  --   --   --   --  178  --   --  166  LABPROT  --   --  14.0  --   --   --   --   --   --   INR  --   --  1.1  --   --   --   --   --   --   HEPARINUNFRC  --   --   --   --    < > 0.27*  --  0.16* 0.27*  CREATININE 1.31*  --   --   --   --   --  1.15  --  1.09  TROPONINIHS '5 4 5 5  '$ --   --   --   --   --    < > = values in this interval not displayed.     Estimated Creatinine Clearance: 69.1 mL/min (by C-G formula based on SCr of 1.09 mg/dL).   Medical History: Past Medical History:  Diagnosis Date   Healthy adult on routine physical examination     Medications:  Scheduled:   aspirin EC  81 mg Oral Daily   multivitamin with minerals  1 tablet Oral Daily   rosuvastatin  40 mg Oral Daily   tamsulosin  0.4 mg Oral Daily   Infusions:   heparin 1,350 Units/hr (06/03/22 1832)    Assessment: Patient presented with chest pain concerning for ACS/unstable angina. Pharmacy consulted to start IV heparin. Patient reports taking no anticoagulation prior to admission. CBC within normal limits with no concern for bleeding. Spoke with patient, no signs/symptoms of bleeding and no issues with the infusion noted.  Heparin level low at 0.27  Goal of Therapy:  Heparin level 0.3-0.7 units/ml Monitor platelets by anticoagulation  protocol: Yes   Plan:  Increase Heparin to 1500 units/hr  Follow up heparin level in 6 hours Cath planned for Monday  Sandford Craze, PharmD. [facility] Acute [facility] PGY-1 06/04/2022 11:04 AM

## 2022-06-04 NOTE — Progress Notes (Signed)
Progress Note  Patient Name: Mark Fitzgerald Date of Encounter: 06/04/2022  [facility] Cardiologist: None   Subjective   Feeling well. No chest pain. Plan for cath tomorrow.  Coronary CTA with 70-99% prox-LAD stenosis with +FFR.  Inpatient Medications    Scheduled Meds:  aspirin EC  81 mg Oral Daily   multivitamin with minerals  1 tablet Oral Daily   rosuvastatin  40 mg Oral Daily   tamsulosin  0.4 mg Oral Daily   Continuous Infusions:  heparin 1,350 Units/hr (06/03/22 1832)   PRN Meds: acetaminophen, morphine injection, nitroGLYCERIN, ondansetron (ZOFRAN) IV, traMADol   Vital Signs    Vitals:   06/03/22 1151 06/03/22 1736 06/03/22 2108 06/04/22 0527  BP: 120/80 117/73 113/70 100/66  Pulse: 70 68  92  Resp: '18 16 18 20  '$ Temp: 98.2 F (36.8 C) 97.9 F (36.6 C) 98 F (36.7 C) 98.7 F (37.1 C)  TempSrc: Oral Oral Oral Oral  SpO2: 94% 100% 100% 100%  Weight: 76.4 kg   76.3 kg  Height: '5\' 11"'$  (1.803 m)       Intake/Output Summary (Last 24 hours) at 06/04/2022 0651 Last data filed at 06/03/2022 2100 Gross per 24 hour  Intake 500.72 ml  Output --  Net 500.72 ml      06/04/2022    5:27 AM 06/03/2022   11:51 AM 06/02/2022    4:17 PM  Last 3 Weights  Weight (lbs) 168 lb 3.2 oz 168 lb 6.9 oz 165 lb  Weight (kg) 76.295 kg 76.4 kg 74.844 kg      Telemetry    NSR - Personally Reviewed  ECG    NSR, no ischemic changes - Personally Reviewed  Physical Exam   GEN: Comfortable, NAD Neck: No JVD Cardiac: RRR, no murmurs, rubs, or gallops.  Respiratory: Clear to auscultation bilaterally. GI: Soft, nontender, non-distended  MS: No edema; No deformity. Neuro:  Nonfocal  Psych: Normal affect   Labs    High Sensitivity Troponin:   Recent Labs  Lab 06/02/22 0846 06/02/22 1043 06/02/22 1617 06/02/22 2047  TROPONINIHS '5 4 5 5      '$ Chemistry Recent Labs  Lab 06/02/22 0846 06/02/22 2047 06/03/22 0923 06/04/22 0213  NA 137  --  136 138  K 4.4  --   4.1 4.0  CL 103  --  108 106  CO2 27  --  21* 25  GLUCOSE 108*  --  110* 108*  BUN 16  --  14 11  CREATININE 1.31*  --  1.15 1.09  CALCIUM 9.4  --  8.6* 8.7*  PROT  --  6.2*  --   --   ALBUMIN  --  3.6  --   --   AST  --  25  --   --   ALT  --  19  --   --   ALKPHOS  --  40  --   --   BILITOT  --  0.9  --   --   GFRNONAA 59*  --  >60 >60  ANIONGAP 7  --  7 7     Lipids  Recent Labs  Lab 06/03/22 0254  CHOL 171  TRIG 74  HDL 53  LDLCALC 103*  CHOLHDL 3.2     Hematology Recent Labs  Lab 06/02/22 0846 06/03/22 0254 06/04/22 0213  WBC 5.7 9.8 8.2  RBC 4.82 4.37 4.27  HGB 15.9 14.2 14.3  HCT 46.5 42.5 40.5  MCV 96.5 97.3  94.8  MCH 33.0 32.5 33.5  MCHC 34.2 33.4 35.3  RDW 13.1 13.2 13.0  PLT 213 178 166    Thyroid  Recent Labs  Lab 06/02/22 1636  TSH 1.529     BNPNo results for input(s): "BNP", "PROBNP" in the last 168 hours.  DDimer No results for input(s): "DDIMER" in the last 168 hours.   Radiology    ECHOCARDIOGRAM COMPLETE  Result Date: 06/03/2022    ECHOCARDIOGRAM REPORT   Patient Name:   Mark Fitzgerald Date of Exam: 06/03/2022 Medical Rec #:  [id number]      Height:       71.0 in Accession #:    [id number]     Weight:       168.4 lb Date of Birth:  1956-07-01      BSA:          1.960 m Patient Age:    3 years       BP:           120/80 mmHg Patient Gender: M              HR:           67 bpm. Exam Location:  Inpatient Procedure: 2D Echo Indications:    chest pain  History:        Patient has no prior history of Echocardiogram examinations.                 CAD.  Sonographer:    Johny Chess RDCS Referring Phys: [id number] [place]  1. Left ventricular ejection fraction, by estimation, is 60 to 65%. The left ventricle has normal function. The left ventricle has no regional wall motion abnormalities. Left ventricular diastolic parameters are consistent with Grade I diastolic dysfunction (impaired relaxation).  2. Right ventricular systolic  function is normal. The right ventricular size is normal. There is normal pulmonary artery systolic pressure. The estimated right ventricular systolic pressure is 31.5 mmHg.  3. The mitral valve is grossly normal. Trivial mitral valve regurgitation. No evidence of mitral stenosis.  4. The aortic valve is tricuspid. Aortic valve regurgitation is not visualized. No aortic stenosis is present.  5. The inferior vena cava is normal in size with greater than 50% respiratory variability, suggesting right atrial pressure of 3 mmHg. FINDINGS  Left Ventricle: Left ventricular ejection fraction, by estimation, is 60 to 65%. The left ventricle has normal function. The left ventricle has no regional wall motion abnormalities. The left ventricular internal cavity size was normal in size. There is  no left ventricular hypertrophy. Left ventricular diastolic parameters are consistent with Grade I diastolic dysfunction (impaired relaxation). Right Ventricle: The right ventricular size is normal. No increase in right ventricular wall thickness. Right ventricular systolic function is normal. There is normal pulmonary artery systolic pressure. The tricuspid regurgitant velocity is 2.40 m/s, and  with an assumed right atrial pressure of 3 mmHg, the estimated right ventricular systolic pressure is 17.6 mmHg. Left Atrium: Left atrial size was normal in size. Right Atrium: Right atrial size was normal in size. Pericardium: There is no evidence of pericardial effusion. Mitral Valve: The mitral valve is grossly normal. Trivial mitral valve regurgitation. No evidence of mitral valve stenosis. Tricuspid Valve: The tricuspid valve is grossly normal. Tricuspid valve regurgitation is trivial. No evidence of tricuspid stenosis. Aortic Valve: The aortic valve is tricuspid. Aortic valve regurgitation is not visualized. No aortic stenosis is present. Pulmonic Valve: The pulmonic valve was grossly normal.  Pulmonic valve regurgitation is trivial. No  evidence of pulmonic stenosis. Aorta: The aortic root and ascending aorta are structurally normal, with no evidence of dilitation. Venous: The inferior vena cava is normal in size with greater than 50% respiratory variability, suggesting right atrial pressure of 3 mmHg. IAS/Shunts: The atrial septum is grossly normal.  LEFT VENTRICLE PLAX 2D LVIDd:         4.50 cm   Diastology LVIDs:         2.60 cm   LV e' medial:    5.98 cm/s LV PW:         0.90 cm   LV E/e' medial:  8.8 LV IVS:        1.00 cm   LV e' lateral:   9.14 cm/s LVOT diam:     2.20 cm   LV E/e' lateral: 5.8 LV SV:         59 LV SV Index:   30 LVOT Area:     3.80 cm  RIGHT VENTRICLE             IVC RV S prime:     14.50 cm/s  IVC diam: 1.50 cm TAPSE (M-mode): 2.7 cm LEFT ATRIUM             Index        RIGHT ATRIUM           Index LA diam:        3.50 cm 1.79 cm/m   RA Area:     10.80 cm LA Vol (A2C):   42.2 ml 21.53 ml/m  RA Volume:   20.70 ml  10.56 ml/m LA Vol (A4C):   46.4 ml 23.67 ml/m LA Biplane Vol: 44.8 ml 22.86 ml/m  AORTIC VALVE LVOT Vmax:   80.50 cm/s LVOT Vmean:  50.700 cm/s LVOT VTI:    0.155 m  AORTA Ao Root diam: 3.30 cm Ao Asc diam:  3.00 cm MITRAL VALVE               TRICUSPID VALVE MV Area (PHT): 3.03 cm    TR Peak grad:   23.0 mmHg MV Decel Time: 250 msec    TR Vmax:        240.00 cm/s MV E velocity: 52.80 cm/s MV A velocity: 86.60 cm/s  SHUNTS MV E/A ratio:  0.61        Systemic VTI:  0.16 m                            Systemic Diam: 2.20 cm Eleonore Chiquito MD Electronically signed by Eleonore Chiquito MD Signature Date/Time: 06/03/2022/3:23:21 PM    Final    CT CORONARY FRACTIONAL FLOW RESERVE DATA PREP  Result Date: 06/02/2022 EXAM: FFRCT ANALYSIS FINDINGS: FFRct analysis was performed on the original cardiac CT angiogram dataset. Diagrammatic representation of the FFRct analysis is provided in a separate PDF document in PACS. This dictation was created using the PDF document and an interactive 3D model of the results. 3D model  is not available in the EMR/PACS. Normal FFR range is >0.80. 1. Left Main: 1.0 to 0.95 2. LAD: 0.95 to 0.75 proximal to mid, 0.67 distal 3. LCX: 0.9 mid, 0.79 distal 4. Ramus: NA 5. RCA: 1.0 to 0.91 IMPRESSION: 1.  Abnormal FFR LAD suggestive of flow limiting CAD 2.  Recommend cardiac catheterization. Note: These examples are not recommendations of [employer] and only provided as examples of what other customers  are doing. Electronically Signed   By: Candee Furbish M.D.   On: 06/02/2022 15:34   CT CORONARY FRACTIONAL FLOW RESERVE FLUID ANALYSIS  Result Date: 06/02/2022 EXAM: FFRCT ANALYSIS FINDINGS: FFRct analysis was performed on the original cardiac CT angiogram dataset. Diagrammatic representation of the FFRct analysis is provided in a separate PDF document in PACS. This dictation was created using the PDF document and an interactive 3D model of the results. 3D model is not available in the EMR/PACS. Normal FFR range is >0.80. 1. Left Main: 1.0 to 0.95 2. LAD: 0.95 to 0.75 proximal to mid, 0.67 distal 3. LCX: 0.9 mid, 0.79 distal 4. Ramus: NA 5. RCA: 1.0 to 0.91 IMPRESSION: 1.  Abnormal FFR LAD suggestive of flow limiting CAD 2.  Recommend cardiac catheterization. Note: These examples are not recommendations of Heart[employer] and only provided as examples of what other customers are doing. Electronically Signed   By: Candee Furbish M.D.   On: 06/02/2022 15:34   CT CORONARY MORPH W/CTA COR W/SCORE W/CA W/CM &/OR WO/CM  Addendum Date: 06/02/2022   ADDENDUM REPORT: 06/02/2022 15:25 CLINICAL DATA:  66 year old with chest pain EXAM: Cardiac/Coronary  CTA TECHNIQUE: The patient was scanned on a [employer]. FINDINGS: A 120 kV prospective scan was triggered in the descending thoracic aorta at 111 HU's. Axial non-contrast 3 mm slices were carried out through the heart. The data set was analyzed on a dedicated work station and scored using the [employer]. Gantry rotation speed was 250 msecs and collimation  was .6 mm. 0.8 mg of sl NTG was given. The 3D data set was reconstructed in 5% intervals of the 67-82 % of the R-R cycle. Diastolic phases were analyzed on a dedicated work station using MPR, MIP and VRT modes. The patient received 80 cc of contrast. Image quality: good Aorta:  Normal size.  No calcifications.  No dissection. Aortic Valve: No calcifications. Coronary Arteries:  Normal coronary origin.  Right dominance. RCA is a large dominant artery that gives rise to PDA and PLA. There is scattered calcified plaque, 50-69% stenosis prox/mid. Mild 30-49% distal. Left main is a large artery that gives rise to LAD and LCX arteries. There is 50% stenosis distal left main. LAD is a large vessel that has scattered calcified and non calcified plaque. There is severe 70-99% stenosis proximally, mild 30-49% stenosis distally. LCX is a non-dominant artery that gives rise to one large OM1 branch. There is scattered calcified plaque. Moderate 50-69% stenosis prox/mid circ. Other findings: Normal pulmonary vein drainage into the left atrium. Normal left atrial appendage without a thrombus. Normal size of the pulmonary artery. Please see radiology report for non cardiac findings. IMPRESSION: 1. Coronary calcium score of 1659. This was 72 percentile for age and sex matched control. 2. Normal coronary origin with right dominance. 3. Distal Left main 50% stenosis. 4. Severe proximal LAD stenosis 70-99%. FFR 0.75 post lesion, 0.67 distal. Abnormal flow. 5. Moderate RCA and LCX stenosis 50-69%.  (FFR negative) CAD-RADS 4 Severe stenosis. (70-99% or > 50% left main). Cardiac catheterization is recommended. Consider symptom-guided anti-ischemic pharmacotherapy as well as risk factor modification per guideline directed care. Electronically Signed   By: Candee Furbish M.D.   On: 06/02/2022 15:25   Result Date: 06/02/2022 EXAM: OVER-READ INTERPRETATION  CT CHEST The following report is a limited chest CT over-read performed by radiologist  Dr. Yetta Glassman of [facility], [place] on 06/02/2022. This over-read does not include interpretation of cardiac or coronary  anatomy or pathology. The cardiac CTA interpretation by the cardiologist is attached. COMPARISON:  None Available. FINDINGS: Vascular: Normal heart size. No pericardial effusion. No suspicious filling defects of the central pulmonary arteries. Mediastinum/Nodes: Esophagus is unremarkable. No pathologically enlarged lymph nodes seen in the chest. Lungs/Pleura: Lungs are clear. No pleural effusion or pneumothorax. Upper Abdomen: No acute abnormality. Musculoskeletal: No chest wall mass or suspicious bone lesions identified. IMPRESSION: No significant extracardiac abnormality. Electronically Signed: By: Yetta Glassman M.D. On: 06/02/2022 14:53   DG Chest 2 View  Result Date: 06/02/2022 CLINICAL DATA:  Chest pain. EXAM: CHEST - 2 VIEW COMPARISON:  None Available. FINDINGS: Both lungs are clear. Heart and mediastinum are within normal limits. Trachea is midline. No large pleural effusions. No acute bone abnormality. Negative for a pneumothorax. IMPRESSION: No active cardiopulmonary disease. Electronically Signed   By: Markus Daft M.D.   On: 06/02/2022 09:03    Cardiac Studies   Cardiac CT today   ADDENDUM REPORT: 06/02/2022 15:25   CLINICAL DATA:  66 year old with chest pain   EXAM: Cardiac/Coronary  CTA   TECHNIQUE: The patient was scanned on a [employer].   FINDINGS: A 120 kV prospective scan was triggered in the descending thoracic aorta at 111 HU's. Axial non-contrast 3 mm slices were carried out through the heart. The data set was analyzed on a dedicated work station and scored using the [place]. Gantry rotation speed was 250 msecs and collimation was .6 mm. 0.8 mg of sl NTG was given. The 3D data set was reconstructed in 5% intervals of the 67-82 % of the R-R cycle. Diastolic phases were analyzed on a dedicated work station using MPR, MIP  and VRT modes. The patient received 80 cc of contrast.   Image quality: good   Aorta:  Normal size.  No calcifications.  No dissection.   Aortic Valve: No calcifications.   Coronary Arteries:  Normal coronary origin.  Right dominance.   RCA is a large dominant artery that gives rise to PDA and PLA. There is scattered calcified plaque, 50-69% stenosis prox/mid. Mild 30-49% distal.   Left main is a large artery that gives rise to LAD and LCX arteries. There is 50% stenosis distal left main.   LAD is a large vessel that has scattered calcified and non calcified plaque. There is severe 70-99% stenosis proximally, mild 30-49% stenosis distally.   LCX is a non-dominant artery that gives rise to one large OM1 branch. There is scattered calcified plaque. Moderate 50-69% stenosis prox/mid circ.   Other findings:   Normal pulmonary vein drainage into the left atrium.   Normal left atrial appendage without a thrombus.   Normal size of the pulmonary artery.   Please see radiology report for non cardiac findings.   IMPRESSION: 1. Coronary calcium score of 1659. This was 59 percentile for age and sex matched control.   2. Normal coronary origin with right dominance.   3. Distal Left main 50% stenosis.   4. Severe proximal LAD stenosis 70-99%. FFR 0.75 post lesion, 0.67 distal. Abnormal flow.   5. Moderate RCA and LCX stenosis 50-69%.  (FFR negative)   CAD-RADS 4 Severe stenosis. (70-99% or > 50% left main). Cardiac catheterization is recommended. Consider symptom-guided anti-ischemic pharmacotherapy as well as risk factor modification per guideline directed care.  TTE 06/03/22: IMPRESSIONS     1. Left ventricular ejection fraction, by estimation, is 60 to 65%. The  left ventricle has normal function. The left ventricle  has no regional  wall motion abnormalities. Left ventricular diastolic parameters are  consistent with Grade I diastolic  dysfunction (impaired  relaxation).   2. Right ventricular systolic function is normal. The right ventricular  size is normal. There is normal pulmonary artery systolic pressure. The  estimated right ventricular systolic pressure is 48.0 mmHg.   3. The mitral valve is grossly normal. Trivial mitral valve  regurgitation. No evidence of mitral stenosis.   4. The aortic valve is tricuspid. Aortic valve regurgitation is not  visualized. No aortic stenosis is present.   5. The inferior vena cava is normal in size with greater than 50%  respiratory variability, suggesting right atrial pressure of 3 mmHg.   Patient Profile     66 y.o. male with no significant past medical history who presented with atypical chest and back pain found to have FFR significant prox LAD stenosis, 50% LM disease, 50-69% RCA and LCX stenosis now admitted to Cardiology for cath.  Assessment & Plan    #Chest pain/back pain/possible unstable angina -chest pain has mixed typical/atypical features - despite persistence of pain, initial troponins negative and EKGs nonacute, though coronary CTA suggestive of possible obstructive CAD in prox-LAD - planned for cath on Monday; discussed risks/benefits today as detailed below and patient amenable to proceed - TTE with normal BiV function, EF 60-65% - continue heparin gtt - continue ASA '81mg'$  daily - continue crestor '40mg'$  daily - morphine prn for pain; pain was not responsive to nitro - not on BB due to baseline bradycardia   #Renal insufficiency: -Cr 1.31 on admission and improved to 1.09 today after gentle IVF -Monitor   #Family hx of premature CAD - LDL 103, TG 74, HDL 74 - Will check NMR panel, Lp(a), apolipoprotein B as outpatient  INFORMED CONSENT: I have reviewed the risks, indications, and alternatives to cardiac catheterization, possible angioplasty, and stenting with the patient. Risks include but are not limited to bleeding, infection, vascular injury, stroke, myocardial infection,  arrhythmia, kidney injury, radiation-related injury in the case of prolonged fluoroscopy use, emergency cardiac surgery, and death. The patient understands the risks of serious complication is 1-2 in 1655 with diagnostic cardiac cath and 1-2% or less with angioplasty/stenting.    For questions or updates, please contact [place] Please consult [email or web address] for contact info under        Signed, Freada Bergeron, MD  06/04/2022, 6:51 AM

## 2022-06-04 NOTE — Plan of Care (Signed)

## 2022-06-04 NOTE — H&P (View-Only) (Signed)
Progress Note  Patient Name: Mark Fitzgerald Date of Encounter: 06/04/2022  [facility] Cardiologist: None   Subjective   Feeling well. No chest pain. Plan for cath tomorrow.  Coronary CTA with 70-99% prox-LAD stenosis with +FFR.  Inpatient Medications    Scheduled Meds:  aspirin EC  81 mg Oral Daily   multivitamin with minerals  1 tablet Oral Daily   rosuvastatin  40 mg Oral Daily   tamsulosin  0.4 mg Oral Daily   Continuous Infusions:  heparin 1,350 Units/hr (06/03/22 1832)   PRN Meds: acetaminophen, morphine injection, nitroGLYCERIN, ondansetron (ZOFRAN) IV, traMADol   Vital Signs    Vitals:   06/03/22 1151 06/03/22 1736 06/03/22 2108 06/04/22 0527  BP: 120/80 117/73 113/70 100/66  Pulse: 70 68  92  Resp: '18 16 18 20  '$ Temp: 98.2 F (36.8 C) 97.9 F (36.6 C) 98 F (36.7 C) 98.7 F (37.1 C)  TempSrc: Oral Oral Oral Oral  SpO2: 94% 100% 100% 100%  Weight: 76.4 kg   76.3 kg  Height: '5\' 11"'$  (1.803 m)       Intake/Output Summary (Last 24 hours) at 06/04/2022 0651 Last data filed at 06/03/2022 2100 Gross per 24 hour  Intake 500.72 ml  Output --  Net 500.72 ml      06/04/2022    5:27 AM 06/03/2022   11:51 AM 06/02/2022    4:17 PM  Last 3 Weights  Weight (lbs) 168 lb 3.2 oz 168 lb 6.9 oz 165 lb  Weight (kg) 76.295 kg 76.4 kg 74.844 kg      Telemetry    NSR - Personally Reviewed  ECG    NSR, no ischemic changes - Personally Reviewed  Physical Exam   GEN: Comfortable, NAD Neck: No JVD Cardiac: RRR, no murmurs, rubs, or gallops.  Respiratory: Clear to auscultation bilaterally. GI: Soft, nontender, non-distended  MS: No edema; No deformity. Neuro:  Nonfocal  Psych: Normal affect   Labs    High Sensitivity Troponin:   Recent Labs  Lab 06/02/22 0846 06/02/22 1043 06/02/22 1617 06/02/22 2047  TROPONINIHS '5 4 5 5      '$ Chemistry Recent Labs  Lab 06/02/22 0846 06/02/22 2047 06/03/22 0923 06/04/22 0213  NA 137  --  136 138  K 4.4  --   4.1 4.0  CL 103  --  108 106  CO2 27  --  21* 25  GLUCOSE 108*  --  110* 108*  BUN 16  --  14 11  CREATININE 1.31*  --  1.15 1.09  CALCIUM 9.4  --  8.6* 8.7*  PROT  --  6.2*  --   --   ALBUMIN  --  3.6  --   --   AST  --  25  --   --   ALT  --  19  --   --   ALKPHOS  --  40  --   --   BILITOT  --  0.9  --   --   GFRNONAA 59*  --  >60 >60  ANIONGAP 7  --  7 7     Lipids  Recent Labs  Lab 06/03/22 0254  CHOL 171  TRIG 74  HDL 53  LDLCALC 103*  CHOLHDL 3.2     Hematology Recent Labs  Lab 06/02/22 0846 06/03/22 0254 06/04/22 0213  WBC 5.7 9.8 8.2  RBC 4.82 4.37 4.27  HGB 15.9 14.2 14.3  HCT 46.5 42.5 40.5  MCV 96.5 97.3  94.8  MCH 33.0 32.5 33.5  MCHC 34.2 33.4 35.3  RDW 13.1 13.2 13.0  PLT 213 178 166    Thyroid  Recent Labs  Lab 06/02/22 1636  TSH 1.529     BNPNo results for input(s): "BNP", "PROBNP" in the last 168 hours.  DDimer No results for input(s): "DDIMER" in the last 168 hours.   Radiology    ECHOCARDIOGRAM COMPLETE  Result Date: 06/03/2022    ECHOCARDIOGRAM REPORT   Patient Name:   GORGE ALMANZA Date of Exam: 06/03/2022 Medical Rec #:  [id number]      Height:       71.0 in Accession #:    [id number]     Weight:       168.4 lb Date of Birth:  07/07/1956      BSA:          1.960 m Patient Age:    66 years       BP:           120/80 mmHg Patient Gender: M              HR:           67 bpm. Exam Location:  Inpatient Procedure: 2D Echo Indications:    chest pain  History:        Patient has no prior history of Echocardiogram examinations.                 CAD.  Sonographer:    Johny Chess RDCS Referring Phys: [address] [place]  1. Left ventricular ejection fraction, by estimation, is 60 to 65%. The left ventricle has normal function. The left ventricle has no regional wall motion abnormalities. Left ventricular diastolic parameters are consistent with Grade I diastolic dysfunction (impaired relaxation).  2. Right ventricular systolic  function is normal. The right ventricular size is normal. There is normal pulmonary artery systolic pressure. The estimated right ventricular systolic pressure is 11.9 mmHg.  3. The mitral valve is grossly normal. Trivial mitral valve regurgitation. No evidence of mitral stenosis.  4. The aortic valve is tricuspid. Aortic valve regurgitation is not visualized. No aortic stenosis is present.  5. The inferior vena cava is normal in size with greater than 50% respiratory variability, suggesting right atrial pressure of 3 mmHg. FINDINGS  Left Ventricle: Left ventricular ejection fraction, by estimation, is 60 to 65%. The left ventricle has normal function. The left ventricle has no regional wall motion abnormalities. The left ventricular internal cavity size was normal in size. There is  no left ventricular hypertrophy. Left ventricular diastolic parameters are consistent with Grade I diastolic dysfunction (impaired relaxation). Right Ventricle: The right ventricular size is normal. No increase in right ventricular wall thickness. Right ventricular systolic function is normal. There is normal pulmonary artery systolic pressure. The tricuspid regurgitant velocity is 2.40 m/s, and  with an assumed right atrial pressure of 3 mmHg, the estimated right ventricular systolic pressure is 41.7 mmHg. Left Atrium: Left atrial size was normal in size. Right Atrium: Right atrial size was normal in size. Pericardium: There is no evidence of pericardial effusion. Mitral Valve: The mitral valve is grossly normal. Trivial mitral valve regurgitation. No evidence of mitral valve stenosis. Tricuspid Valve: The tricuspid valve is grossly normal. Tricuspid valve regurgitation is trivial. No evidence of tricuspid stenosis. Aortic Valve: The aortic valve is tricuspid. Aortic valve regurgitation is not visualized. No aortic stenosis is present. Pulmonic Valve: The pulmonic valve was grossly normal.  Pulmonic valve regurgitation is trivial. No  evidence of pulmonic stenosis. Aorta: The aortic root and ascending aorta are structurally normal, with no evidence of dilitation. Venous: The inferior vena cava is normal in size with greater than 50% respiratory variability, suggesting right atrial pressure of 3 mmHg. IAS/Shunts: The atrial septum is grossly normal.  LEFT VENTRICLE PLAX 2D LVIDd:         4.50 cm   Diastology LVIDs:         2.60 cm   LV e' medial:    5.98 cm/s LV PW:         0.90 cm   LV E/e' medial:  8.8 LV IVS:        1.00 cm   LV e' lateral:   9.14 cm/s LVOT diam:     2.20 cm   LV E/e' lateral: 5.8 LV SV:         59 LV SV Index:   30 LVOT Area:     3.80 cm  RIGHT VENTRICLE             IVC RV S prime:     14.50 cm/s  IVC diam: 1.50 cm TAPSE (M-mode): 2.7 cm LEFT ATRIUM             Index        RIGHT ATRIUM           Index LA diam:        3.50 cm 1.79 cm/m   RA Area:     10.80 cm LA Vol (A2C):   42.2 ml 21.53 ml/m  RA Volume:   20.70 ml  10.56 ml/m LA Vol (A4C):   46.4 ml 23.67 ml/m LA Biplane Vol: 44.8 ml 22.86 ml/m  AORTIC VALVE LVOT Vmax:   80.50 cm/s LVOT Vmean:  50.700 cm/s LVOT VTI:    0.155 m  AORTA Ao Root diam: 3.30 cm Ao Asc diam:  3.00 cm MITRAL VALVE               TRICUSPID VALVE MV Area (PHT): 3.03 cm    TR Peak grad:   23.0 mmHg MV Decel Time: 250 msec    TR Vmax:        240.00 cm/s MV E velocity: 52.80 cm/s MV A velocity: 86.60 cm/s  SHUNTS MV E/A ratio:  0.61        Systemic VTI:  0.16 m                            Systemic Diam: 2.20 cm Eleonore Chiquito MD Electronically signed by Eleonore Chiquito MD Signature Date/Time: 06/03/2022/3:23:21 PM    Final    CT CORONARY FRACTIONAL FLOW RESERVE DATA PREP  Result Date: 06/02/2022 EXAM: FFRCT ANALYSIS FINDINGS: FFRct analysis was performed on the original cardiac CT angiogram dataset. Diagrammatic representation of the FFRct analysis is provided in a separate PDF document in PACS. This dictation was created using the PDF document and an interactive 3D model of the results. 3D model  is not available in the EMR/PACS. Normal FFR range is >0.80. 1. Left Main: 1.0 to 0.95 2. LAD: 0.95 to 0.75 proximal to mid, 0.67 distal 3. LCX: 0.9 mid, 0.79 distal 4. Ramus: NA 5. RCA: 1.0 to 0.91 IMPRESSION: 1.  Abnormal FFR LAD suggestive of flow limiting CAD 2.  Recommend cardiac catheterization. Note: These examples are not recommendations of [employer] and only provided as examples of what other customers  are doing. Electronically Signed   By: Candee Furbish M.D.   On: 06/02/2022 15:34   CT CORONARY FRACTIONAL FLOW RESERVE FLUID ANALYSIS  Result Date: 06/02/2022 EXAM: FFRCT ANALYSIS FINDINGS: FFRct analysis was performed on the original cardiac CT angiogram dataset. Diagrammatic representation of the FFRct analysis is provided in a separate PDF document in PACS. This dictation was created using the PDF document and an interactive 3D model of the results. 3D model is not available in the EMR/PACS. Normal FFR range is >0.80. 1. Left Main: 1.0 to 0.95 2. LAD: 0.95 to 0.75 proximal to mid, 0.67 distal 3. LCX: 0.9 mid, 0.79 distal 4. Ramus: NA 5. RCA: 1.0 to 0.91 IMPRESSION: 1.  Abnormal FFR LAD suggestive of flow limiting CAD 2.  Recommend cardiac catheterization. Note: These examples are not recommendations of HeartFlow and only provided as examples of what other customers are doing. Electronically Signed   By: Candee Furbish M.D.   On: 06/02/2022 15:34   CT CORONARY MORPH W/CTA COR W/SCORE W/CA W/CM &/OR WO/CM  Addendum Date: 06/02/2022   ADDENDUM REPORT: 06/02/2022 15:25 CLINICAL DATA:  66 year old with chest pain EXAM: Cardiac/Coronary  CTA TECHNIQUE: The patient was scanned on a [employer]. FINDINGS: A 120 kV prospective scan was triggered in the descending thoracic aorta at 111 HU's. Axial non-contrast 3 mm slices were carried out through the heart. The data set was analyzed on a dedicated work station and scored using the [place]. Gantry rotation speed was 250 msecs and collimation  was .6 mm. 0.8 mg of sl NTG was given. The 3D data set was reconstructed in 5% intervals of the 67-82 % of the R-R cycle. Diastolic phases were analyzed on a dedicated work station using MPR, MIP and VRT modes. The patient received 80 cc of contrast. Image quality: good Aorta:  Normal size.  No calcifications.  No dissection. Aortic Valve: No calcifications. Coronary Arteries:  Normal coronary origin.  Right dominance. RCA is a large dominant artery that gives rise to PDA and PLA. There is scattered calcified plaque, 50-69% stenosis prox/mid. Mild 30-49% distal. Left main is a large artery that gives rise to LAD and LCX arteries. There is 50% stenosis distal left main. LAD is a large vessel that has scattered calcified and non calcified plaque. There is severe 70-99% stenosis proximally, mild 30-49% stenosis distally. LCX is a non-dominant artery that gives rise to one large OM1 branch. There is scattered calcified plaque. Moderate 50-69% stenosis prox/mid circ. Other findings: Normal pulmonary vein drainage into the left atrium. Normal left atrial appendage without a thrombus. Normal size of the pulmonary artery. Please see radiology report for non cardiac findings. IMPRESSION: 1. Coronary calcium score of 1659. This was 77 percentile for age and sex matched control. 2. Normal coronary origin with right dominance. 3. Distal Left main 50% stenosis. 4. Severe proximal LAD stenosis 70-99%. FFR 0.75 post lesion, 0.67 distal. Abnormal flow. 5. Moderate RCA and LCX stenosis 50-69%.  (FFR negative) CAD-RADS 4 Severe stenosis. (70-99% or > 50% left main). Cardiac catheterization is recommended. Consider symptom-guided anti-ischemic pharmacotherapy as well as risk factor modification per guideline directed care. Electronically Signed   By: Candee Furbish M.D.   On: 06/02/2022 15:25   Result Date: 06/02/2022 EXAM: OVER-READ INTERPRETATION  CT CHEST The following report is a limited chest CT over-read performed by radiologist  Dr. Yetta Glassman of [facility], [place] on 06/02/2022. This over-read does not include interpretation of cardiac or coronary  anatomy or pathology. The cardiac CTA interpretation by the cardiologist is attached. COMPARISON:  None Available. FINDINGS: Vascular: Normal heart size. No pericardial effusion. No suspicious filling defects of the central pulmonary arteries. Mediastinum/Nodes: Esophagus is unremarkable. No pathologically enlarged lymph nodes seen in the chest. Lungs/Pleura: Lungs are clear. No pleural effusion or pneumothorax. Upper Abdomen: No acute abnormality. Musculoskeletal: No chest wall mass or suspicious bone lesions identified. IMPRESSION: No significant extracardiac abnormality. Electronically Signed: By: Yetta Glassman M.D. On: 06/02/2022 14:53   DG Chest 2 View  Result Date: 06/02/2022 CLINICAL DATA:  Chest pain. EXAM: CHEST - 2 VIEW COMPARISON:  None Available. FINDINGS: Both lungs are clear. Heart and mediastinum are within normal limits. Trachea is midline. No large pleural effusions. No acute bone abnormality. Negative for a pneumothorax. IMPRESSION: No active cardiopulmonary disease. Electronically Signed   By: Markus Daft M.D.   On: 06/02/2022 09:03    Cardiac Studies   Cardiac CT today   ADDENDUM REPORT: 06/02/2022 15:25   CLINICAL DATA:  66 year old with chest pain   EXAM: Cardiac/Coronary  CTA   TECHNIQUE: The patient was scanned on a [employer].   FINDINGS: A 120 kV prospective scan was triggered in the descending thoracic aorta at 111 HU's. Axial non-contrast 3 mm slices were carried out through the heart. The data set was analyzed on a dedicated work station and scored using the [place]. Gantry rotation speed was 250 msecs and collimation was .6 mm. 0.8 mg of sl NTG was given. The 3D data set was reconstructed in 5% intervals of the 67-82 % of the R-R cycle. Diastolic phases were analyzed on a dedicated work station using MPR, MIP  and VRT modes. The patient received 80 cc of contrast.   Image quality: good   Aorta:  Normal size.  No calcifications.  No dissection.   Aortic Valve: No calcifications.   Coronary Arteries:  Normal coronary origin.  Right dominance.   RCA is a large dominant artery that gives rise to PDA and PLA. There is scattered calcified plaque, 50-69% stenosis prox/mid. Mild 30-49% distal.   Left main is a large artery that gives rise to LAD and LCX arteries. There is 50% stenosis distal left main.   LAD is a large vessel that has scattered calcified and non calcified plaque. There is severe 70-99% stenosis proximally, mild 30-49% stenosis distally.   LCX is a non-dominant artery that gives rise to one large OM1 branch. There is scattered calcified plaque. Moderate 50-69% stenosis prox/mid circ.   Other findings:   Normal pulmonary vein drainage into the left atrium.   Normal left atrial appendage without a thrombus.   Normal size of the pulmonary artery.   Please see radiology report for non cardiac findings.   IMPRESSION: 1. Coronary calcium score of 1659. This was 91 percentile for age and sex matched control.   2. Normal coronary origin with right dominance.   3. Distal Left main 50% stenosis.   4. Severe proximal LAD stenosis 70-99%. FFR 0.75 post lesion, 0.67 distal. Abnormal flow.   5. Moderate RCA and LCX stenosis 50-69%.  (FFR negative)   CAD-RADS 4 Severe stenosis. (70-99% or > 50% left main). Cardiac catheterization is recommended. Consider symptom-guided anti-ischemic pharmacotherapy as well as risk factor modification per guideline directed care.  TTE 06/03/22: IMPRESSIONS     1. Left ventricular ejection fraction, by estimation, is 60 to 65%. The  left ventricle has normal function. The left ventricle  has no regional  wall motion abnormalities. Left ventricular diastolic parameters are  consistent with Grade I diastolic  dysfunction (impaired  relaxation).   2. Right ventricular systolic function is normal. The right ventricular  size is normal. There is normal pulmonary artery systolic pressure. The  estimated right ventricular systolic pressure is 26.8 mmHg.   3. The mitral valve is grossly normal. Trivial mitral valve  regurgitation. No evidence of mitral stenosis.   4. The aortic valve is tricuspid. Aortic valve regurgitation is not  visualized. No aortic stenosis is present.   5. The inferior vena cava is normal in size with greater than 50%  respiratory variability, suggesting right atrial pressure of 3 mmHg.   Patient Profile     66 y.o. male with no significant past medical history who presented with atypical chest and back pain found to have FFR significant prox LAD stenosis, 50% LM disease, 50-69% RCA and LCX stenosis now admitted to Cardiology for cath.  Assessment & Plan    #Chest pain/back pain/possible unstable angina -chest pain has mixed typical/atypical features - despite persistence of pain, initial troponins negative and EKGs nonacute, though coronary CTA suggestive of possible obstructive CAD in prox-LAD - planned for cath on Monday; discussed risks/benefits today as detailed below and patient amenable to proceed - TTE with normal BiV function, EF 60-65% - continue heparin gtt - continue ASA '81mg'$  daily - continue crestor '40mg'$  daily - morphine prn for pain; pain was not responsive to nitro - not on BB due to baseline bradycardia   #Renal insufficiency: -Cr 1.31 on admission and improved to 1.09 today after gentle IVF -Monitor   #Family hx of premature CAD - LDL 103, TG 74, HDL 74 - Will check NMR panel, Lp(a), apolipoprotein B as outpatient  INFORMED CONSENT: I have reviewed the risks, indications, and alternatives to cardiac catheterization, possible angioplasty, and stenting with the patient. Risks include but are not limited to bleeding, infection, vascular injury, stroke, myocardial infection,  arrhythmia, kidney injury, radiation-related injury in the case of prolonged fluoroscopy use, emergency cardiac surgery, and death. The patient understands the risks of serious complication is 1-2 in 3419 with diagnostic cardiac cath and 1-2% or less with angioplasty/stenting.    For questions or updates, please contact [place] Please consult [email or web address] for contact info under        Signed, Freada Bergeron, MD  06/04/2022, 6:51 AM

## 2022-06-04 NOTE — H&P (Signed)
Atypical CP with prolonged pain but no enzyme or ECG changes. Cor CTA 50% LM and LAD segmental proximal 70-80% stenosis. CKD III Fam h/o CAD CAC Score> 1600

## 2022-06-05 ENCOUNTER — Other Ambulatory Visit (HOSPITAL_COMMUNITY): Payer: Self-pay

## 2022-06-05 ENCOUNTER — Telehealth (HOSPITAL_COMMUNITY): Payer: Self-pay | Admitting: Pharmacy Technician

## 2022-06-05 ENCOUNTER — Encounter (HOSPITAL_COMMUNITY): Payer: Self-pay | Admitting: Interventional Cardiology

## 2022-06-05 ENCOUNTER — Encounter (HOSPITAL_COMMUNITY)
Admission: EM | Disposition: A | Discharge status: Home / Self Care | Payer: Self-pay | Source: Home / Self Care | Attending: Cardiology

## 2022-06-05 DIAGNOSIS — I25110 Atherosclerotic heart disease of native coronary artery with unstable angina pectoris: Secondary | ICD-10-CM | POA: Diagnosis not present

## 2022-06-05 DIAGNOSIS — I200 Unstable angina: Secondary | ICD-10-CM | POA: Diagnosis not present

## 2022-06-05 DIAGNOSIS — R072 Precordial pain: Secondary | ICD-10-CM

## 2022-06-05 HISTORY — PX: CORONARY STENT INTERVENTION: CATH118234

## 2022-06-05 HISTORY — PX: INTRAVASCULAR ULTRASOUND/IVUS: CATH118244

## 2022-06-05 HISTORY — PX: CORONARY LITHOTRIPSY: CATH118330

## 2022-06-05 HISTORY — PX: LEFT HEART CATH AND CORONARY ANGIOGRAPHY: CATH118249

## 2022-06-05 LAB — BASIC METABOLIC PANEL
Anion gap: 8 (ref 5–15)
BUN: 10 mg/dL (ref 8–23)
CO2: 24 mmol/L (ref 22–32)
Calcium: 8.9 mg/dL (ref 8.9–10.3)
Chloride: 109 mmol/L (ref 98–111)
Creatinine, Ser: 1.05 mg/dL (ref 0.61–1.24)
GFR, Estimated: >60 mL/min (ref >60)
Glucose, Bld: 99 mg/dL (ref 70–99)
Potassium: 4.1 mmol/L (ref 3.5–5.1)
Sodium: 141 mmol/L (ref 135–145)

## 2022-06-05 LAB — CBC
HCT: 41.1 % (ref 39.0–52.0)
Hemoglobin: 14 g/dL (ref 13.0–17.0)
MCH: 32.9 pg (ref 26.0–34.0)
MCHC: 34.1 g/dL (ref 30.0–36.0)
MCV: 96.7 fL (ref 80.0–100.0)
Platelets: 172 10*3/uL (ref 150–400)
RBC: 4.25 MIL/uL (ref 4.22–5.81)
RDW: 13.1 % (ref 11.5–15.5)
WBC: 5.7 10*3/uL (ref 4.0–10.5)
nRBC: 0 % (ref 0.0–0.2)

## 2022-06-05 LAB — HEPARIN LEVEL (UNFRACTIONATED): Heparin Unfractionated: 0.6 IU/mL (ref 0.30–0.70)

## 2022-06-05 LAB — POCT ACTIVATED CLOTTING TIME
Activated Clotting Time: 312 seconds
Activated Clotting Time: 438 seconds

## 2022-06-05 SURGERY — LEFT HEART CATH AND CORONARY ANGIOGRAPHY
Anesthesia: LOCAL

## 2022-06-05 MED ORDER — SODIUM CHLORIDE 0.9 % IV SOLN: INTRAVENOUS | Status: AC

## 2022-06-05 MED ORDER — HYDRALAZINE HCL 20 MG/ML IJ SOLN: 10.0000 mg | INTRAMUSCULAR | Status: AC | PRN

## 2022-06-05 MED ORDER — NITROGLYCERIN 0.4 MG SL SUBL: 0.4000 mg | SUBLINGUAL_TABLET | SUBLINGUAL | Status: DC | PRN

## 2022-06-05 MED ORDER — MIDAZOLAM HCL 2 MG/2ML IJ SOLN
INTRAMUSCULAR | Status: AC
  Filled 2022-06-05: qty 2

## 2022-06-05 MED ORDER — FENTANYL CITRATE (PF) 100 MCG/2ML IJ SOLN
INTRAMUSCULAR | Status: AC
  Filled 2022-06-05: qty 2

## 2022-06-05 MED ORDER — LIDOCAINE HCL (PF) 1 % IJ SOLN
INTRAMUSCULAR | Status: AC
  Filled 2022-06-05: qty 30

## 2022-06-05 MED ORDER — NITROGLYCERIN 1 MG/10 ML FOR IR/CATH LAB
INTRA_ARTERIAL | Status: DC | PRN
  Administered 2022-06-05: 200 ug via INTRACORONARY

## 2022-06-05 MED ORDER — HEPARIN SODIUM (PORCINE) 1000 UNIT/ML IJ SOLN
INTRAMUSCULAR | Status: DC | PRN
  Administered 2022-06-05: 2000 [IU] via INTRAVENOUS
  Administered 2022-06-05: 4000 [IU] via INTRAVENOUS
  Administered 2022-06-05: 6000 [IU] via INTRAVENOUS

## 2022-06-05 MED ORDER — HEPARIN SODIUM (PORCINE) 5000 UNIT/ML IJ SOLN: 5000.0000 [IU] | Freq: Three times a day (TID) | INTRAMUSCULAR | Status: DC

## 2022-06-05 MED ORDER — NITROGLYCERIN IN D5W 200-5 MCG/ML-% IV SOLN: 0.0000 ug/min | INTRAVENOUS | Status: DC

## 2022-06-05 MED ORDER — SODIUM CHLORIDE 0.9% FLUSH
3.0000 mL | Freq: Two times a day (BID) | INTRAVENOUS | Status: DC
  Administered 2022-06-06: 3 mL via INTRAVENOUS

## 2022-06-05 MED ORDER — IOHEXOL 350 MG/ML SOLN
INTRAVENOUS | Status: DC | PRN
  Administered 2022-06-05: 185 mL

## 2022-06-05 MED ORDER — MIDAZOLAM HCL 2 MG/2ML IJ SOLN
INTRAMUSCULAR | Status: DC | PRN
  Administered 2022-06-05 (×3): 1 mg via INTRAVENOUS

## 2022-06-05 MED ORDER — LABETALOL HCL 5 MG/ML IV SOLN: 10.0000 mg | INTRAVENOUS | Status: AC | PRN

## 2022-06-05 MED ORDER — ASPIRIN 81 MG PO CHEW: 81.0000 mg | CHEWABLE_TABLET | Freq: Every day | ORAL | Status: DC

## 2022-06-05 MED ORDER — HEPARIN SODIUM (PORCINE) 1000 UNIT/ML IJ SOLN
INTRAMUSCULAR | Status: AC
  Filled 2022-06-05: qty 10

## 2022-06-05 MED ORDER — HEPARIN (PORCINE) IN NACL 1000-0.9 UT/500ML-% IV SOLN
INTRAVENOUS | Status: DC | PRN
  Administered 2022-06-05 (×2): 500 mL

## 2022-06-05 MED ORDER — TICAGRELOR 90 MG PO TABS
ORAL_TABLET | ORAL | Status: DC | PRN
  Administered 2022-06-05: 180 mg via ORAL

## 2022-06-05 MED ORDER — NITROGLYCERIN IN D5W 200-5 MCG/ML-% IV SOLN
INTRAVENOUS | Status: AC | PRN
  Administered 2022-06-05: 10 ug/min via INTRAVENOUS

## 2022-06-05 MED ORDER — FENTANYL CITRATE (PF) 100 MCG/2ML IJ SOLN
INTRAMUSCULAR | Status: DC | PRN
  Administered 2022-06-05 (×2): 50 ug via INTRAVENOUS
  Administered 2022-06-05: 25 ug via INTRAVENOUS

## 2022-06-05 MED ORDER — ATORVASTATIN CALCIUM 80 MG PO TABS: 80.0000 mg | ORAL_TABLET | Freq: Every day | ORAL | Status: DC

## 2022-06-05 MED ORDER — ONDANSETRON HCL 4 MG/2ML IJ SOLN: 4.0000 mg | Freq: Four times a day (QID) | INTRAMUSCULAR | Status: DC | PRN

## 2022-06-05 MED ORDER — TICAGRELOR 90 MG PO TABS
90.0000 mg | ORAL_TABLET | Freq: Two times a day (BID) | ORAL | Status: DC
Start: 2022-06-05 — End: 2022-06-06
  Administered 2022-06-05 – 2022-06-06 (×2): 90 mg via ORAL
  Filled 2022-06-05 (×2): qty 1

## 2022-06-05 MED ORDER — VERAPAMIL HCL 2.5 MG/ML IV SOLN
INTRAVENOUS | Status: AC
  Filled 2022-06-05: qty 2

## 2022-06-05 MED ORDER — NITROGLYCERIN 1 MG/10 ML FOR IR/CATH LAB
INTRA_ARTERIAL | Status: AC
  Filled 2022-06-05: qty 10

## 2022-06-05 MED ORDER — VERAPAMIL HCL 2.5 MG/ML IV SOLN
INTRAVENOUS | Status: DC | PRN
  Administered 2022-06-05: 10 mL via INTRA_ARTERIAL

## 2022-06-05 MED ORDER — SODIUM CHLORIDE 0.9 % IV SOLN
250.0000 mL | INTRAVENOUS | Status: DC | PRN
Start: 2022-06-05 — End: 2022-06-06

## 2022-06-05 MED ORDER — TICAGRELOR 90 MG PO TABS
ORAL_TABLET | ORAL | Status: AC
  Filled 2022-06-05: qty 2

## 2022-06-05 MED ORDER — LIDOCAINE HCL (PF) 1 % IJ SOLN
INTRAMUSCULAR | Status: DC | PRN
  Administered 2022-06-05: 2 mL

## 2022-06-05 MED ORDER — ACETAMINOPHEN 325 MG PO TABS: 650.0000 mg | ORAL_TABLET | ORAL | Status: DC | PRN

## 2022-06-05 MED ORDER — HEPARIN (PORCINE) IN NACL 1000-0.9 UT/500ML-% IV SOLN
INTRAVENOUS | Status: AC
  Filled 2022-06-05: qty 1000

## 2022-06-05 MED ORDER — SODIUM CHLORIDE 0.9% FLUSH
3.0000 mL | INTRAVENOUS | Status: DC | PRN
Start: 2022-06-05 — End: 2022-06-06

## 2022-06-05 SURGICAL SUPPLY — 21 items
BALL SAPPHIRE NC24 3.25X22 (BALLOONS) ×2
BALLN SAPPHIRE 2.5X15 (BALLOONS) ×2
BALLOON SAPPHIRE 2.5X15 (BALLOONS) IMPLANT
BALLOON SAPPHIRE NC24 3.25X22 (BALLOONS) IMPLANT
BAND ZEPHYR COMPRESS 30 LONG (HEMOSTASIS) ×1 IMPLANT
CATH 5FR JL3.5 JR4 ANG PIG MP (CATHETERS) ×1 IMPLANT
CATH OPTICROSS HD (CATHETERS) ×1 IMPLANT
CATH SHOCKWAVE 3.5X12 (CATHETERS) IMPLANT
CATH VISTA GUIDE 6FR XBLAD3.5 (CATHETERS) ×1 IMPLANT
CATHETER SHOCKWAVE 3.5X12 (CATHETERS) ×2
GLIDESHEATH SLEND A-KIT 6F 22G (SHEATH) ×1 IMPLANT
GUIDEWIRE INQWIRE 1.5J.035X260 (WIRE) IMPLANT
INQWIRE 1.5J .035X260CM (WIRE) ×2
KIT ENCORE 26 ADVANTAGE (KITS) ×1 IMPLANT
KIT HEART LEFT (KITS) ×2 IMPLANT
PACK CARDIAC CATHETERIZATION (CUSTOM PROCEDURE TRAY) ×2 IMPLANT
SLED PULL BACK IVUS (MISCELLANEOUS) ×1 IMPLANT
STENT ONYX FRONTIER 3.0X34 (Permanent Stent) ×1 IMPLANT
TRANSDUCER W/STOPCOCK (MISCELLANEOUS) ×2 IMPLANT
TUBING CIL FLEX 10 FLL-RA (TUBING) ×2 IMPLANT
WIRE ASAHI PROWATER 180CM (WIRE) ×1 IMPLANT

## 2022-06-05 NOTE — Interval H&P Note (Signed)
Cath Lab Visit (complete for each Cath Lab visit)  Clinical Evaluation Leading to the Procedure:   ACS: Yes.    Non-ACS:    Anginal Classification: CCS IV  Anti-ischemic medical therapy: Minimal Therapy (1 class of medications)  Non-Invasive Test Results: High-risk stress test findings: cardiac mortality >3%/year  Prior CABG: No previous CABG      History and Physical Interval Note:  06/05/2022 7:26 AM  Mark Fitzgerald  has presented today for surgery, with the diagnosis of abnormal cardiac ct.  The various methods of treatment have been discussed with the patient and family. After consideration of risks, benefits and other options for treatment, the patient has consented to  Procedure(s): LEFT HEART CATH AND CORONARY ANGIOGRAPHY (N/A) as a surgical intervention.  The patient's history has been reviewed, patient examined, no change in status, stable for surgery.  I have reviewed the patient's chart and labs.  Questions were answered to the patient's satisfaction.     Mark Fitzgerald

## 2022-06-05 NOTE — Progress Notes (Addendum)
Progress Note  Patient Name: Mark Fitzgerald Date of Encounter: 06/05/2022  [facility] Cardiologist: Freada Bergeron, MD   Subjective   Pt with 2/10 chest pain on arrival from the cath lab. Now, CP resolved. TR band in place. Feels well. Asking about activity.   Inpatient Medications    Scheduled Meds:  aspirin  81 mg Oral Daily   aspirin EC  81 mg Oral Daily   atorvastatin  80 mg Oral q1800   heparin  5,000 Units Subcutaneous Q8H   multivitamin with minerals  1 tablet Oral Daily   rosuvastatin  40 mg Oral Daily   sodium chloride flush  3 mL Intravenous Q12H   tamsulosin  0.4 mg Oral Daily   ticagrelor  90 mg Oral BID   Continuous Infusions:  sodium chloride 100 mL/hr at 06/05/22 0951   sodium chloride     heparin Stopped (06/05/22 0651)   nitroGLYCERIN 5 mcg/min (06/05/22 1039)   PRN Meds: sodium chloride, acetaminophen, acetaminophen, hydrALAZINE, labetalol, morphine injection, ondansetron (ZOFRAN) IV, ondansetron (ZOFRAN) IV, sodium chloride flush, traMADol   Vital Signs    Vitals:   06/05/22 0832 06/05/22 0837 06/05/22 0842 06/05/22 0847  BP: 129/88 129/81 117/81 127/77  Pulse: 68 72 (!) 53 (!) 56  Resp: '14 15 12 11  '$ Temp:      TempSrc:      SpO2: 99% 99% 98% 97%  Weight:      Height:        Intake/Output Summary (Last 24 hours) at 06/05/2022 1043 Last data filed at 06/05/2022 0550 Gross per 24 hour  Intake 702.54 ml  Output --  Net 702.54 ml      06/05/2022    5:33 AM 06/04/2022    5:27 AM 06/03/2022   11:51 AM  Last 3 Weights  Weight (lbs) 166 lb 8 oz 168 lb 3.2 oz 168 lb 6.9 oz  Weight (kg) 75.524 kg 76.295 kg 76.4 kg      Telemetry    Sinus rhythm with HR 60s - Personally Reviewed  ECG    SR HR 65 - Personally Reviewed  Physical Exam   GEN: No acute distress.   Neck: No JVD Cardiac: RRR, no murmurs, rubs, or gallops.  Respiratory: Clear to auscultation bilaterally. GI: Soft, nontender, non-distended  MS: No edema; No  deformity. Neuro:  Nonfocal  Psych: Normal affect  Right radial cath site with TR band in place  Labs    High Sensitivity Troponin:   Recent Labs  Lab 06/02/22 0846 06/02/22 1043 06/02/22 1617 06/02/22 2047  TROPONINIHS '5 4 5 5     '$ Chemistry Recent Labs  Lab 06/02/22 2047 06/03/22 0923 06/04/22 0213 06/05/22 0636  NA  --  136 138 141  K  --  4.1 4.0 4.1  CL  --  108 106 109  CO2  --  21* 25 24  GLUCOSE  --  110* 108* 99  BUN  --  '14 11 10  '$ CREATININE  --  1.15 1.09 1.05  CALCIUM  --  8.6* 8.7* 8.9  PROT 6.2*  --   --   --   ALBUMIN 3.6  --   --   --   AST 25  --   --   --   ALT 19  --   --   --   ALKPHOS 40  --   --   --   BILITOT 0.9  --   --   --  GFRNONAA  --  >60 >60 >60  ANIONGAP  --  '7 7 8    '$ Lipids  Recent Labs  Lab 06/03/22 0254  CHOL 171  TRIG 74  HDL 53  LDLCALC 103*  CHOLHDL 3.2    Hematology Recent Labs  Lab 06/03/22 0254 06/04/22 0213 06/05/22 0636  WBC 9.8 8.2 5.7  RBC 4.37 4.27 4.25  HGB 14.2 14.3 14.0  HCT 42.5 40.5 41.1  MCV 97.3 94.8 96.7  MCH 32.5 33.5 32.9  MCHC 33.4 35.3 34.1  RDW 13.2 13.0 13.1  PLT 178 166 172   Thyroid  Recent Labs  Lab 06/02/22 1636  TSH 1.529    BNPNo results for input(s): "BNP", "PROBNP" in the last 168 hours.  DDimer No results for input(s): "DDIMER" in the last 168 hours.   Radiology    CARDIAC CATHETERIZATION  Result Date: 06/05/2022 CONCLUSIONS: 85% heavily calcified proximal to mid LAD stenosis reduced to less than 10% with TIMI grade III flow using a 34 x 3.0 Onyx postdilated to 3.25 at high pressure.  Plaque modification using intravascular lithotripsy 3.5 x 10 device with 10 treatments applied.  Slow flow in a diffusely diseased first diagonal produced ongoing discomfort at the end of the procedure better resolved by the time the patient left the Cath Lab. 35 to 45% distal left main Arteries otherwise have diffuse luminal irregularities but no focal high-grade obstruction. Overall  normal LV function with EDP 14 mmHg. RECOMMENDATIONS: IV nitroglycerin until chest discomfort resolves.  Flow in the diagonal is improving at the time of procedure completion.  Patient had minimal symptoms at discharge from the Cath Lab.  EKG showed no ischemic changes. Aggressive risk factor modification. Dual antiplatelet therapy for at least 12 months.  Consider de-escalation of ticagrelor to Plavix at 6 months in addition to aspirin.  At the end of 12 months, consider clopidogrel monotherapy.   ECHOCARDIOGRAM COMPLETE  Result Date: 06/03/2022    ECHOCARDIOGRAM REPORT   Patient Name:   Mark Fitzgerald Date of Exam: 06/03/2022 Medical Rec #:  [id number]      Height:       71.0 in Accession #:    [id number]     Weight:       168.4 lb Date of Birth:  05-25-1956      BSA:          1.960 m Patient Age:    66 years       BP:           120/80 mmHg Patient Gender: M              HR:           67 bpm. Exam Location:  Inpatient Procedure: 2D Echo Indications:    chest pain  History:        Patient has no prior history of Echocardiogram examinations.                 CAD.  Sonographer:    Johny Chess RDCS Referring Phys: 8 [place]  1. Left ventricular ejection fraction, by estimation, is 60 to 65%. The left ventricle has normal function. The left ventricle has no regional wall motion abnormalities. Left ventricular diastolic parameters are consistent with Grade I diastolic dysfunction (impaired relaxation).  2. Right ventricular systolic function is normal. The right ventricular size is normal. There is normal pulmonary artery systolic pressure. The estimated right ventricular systolic pressure is 98.3 mmHg.  3. The  mitral valve is grossly normal. Trivial mitral valve regurgitation. No evidence of mitral stenosis.  4. The aortic valve is tricuspid. Aortic valve regurgitation is not visualized. No aortic stenosis is present.  5. The inferior vena cava is normal in size with greater than 50%  respiratory variability, suggesting right atrial pressure of 3 mmHg. FINDINGS  Left Ventricle: Left ventricular ejection fraction, by estimation, is 60 to 65%. The left ventricle has normal function. The left ventricle has no regional wall motion abnormalities. The left ventricular internal cavity size was normal in size. There is  no left ventricular hypertrophy. Left ventricular diastolic parameters are consistent with Grade I diastolic dysfunction (impaired relaxation). Right Ventricle: The right ventricular size is normal. No increase in right ventricular wall thickness. Right ventricular systolic function is normal. There is normal pulmonary artery systolic pressure. The tricuspid regurgitant velocity is 2.40 m/s, and  with an assumed right atrial pressure of 3 mmHg, the estimated right ventricular systolic pressure is 41.2 mmHg. Left Atrium: Left atrial size was normal in size. Right Atrium: Right atrial size was normal in size. Pericardium: There is no evidence of pericardial effusion. Mitral Valve: The mitral valve is grossly normal. Trivial mitral valve regurgitation. No evidence of mitral valve stenosis. Tricuspid Valve: The tricuspid valve is grossly normal. Tricuspid valve regurgitation is trivial. No evidence of tricuspid stenosis. Aortic Valve: The aortic valve is tricuspid. Aortic valve regurgitation is not visualized. No aortic stenosis is present. Pulmonic Valve: The pulmonic valve was grossly normal. Pulmonic valve regurgitation is trivial. No evidence of pulmonic stenosis. Aorta: The aortic root and ascending aorta are structurally normal, with no evidence of dilitation. Venous: The inferior vena cava is normal in size with greater than 50% respiratory variability, suggesting right atrial pressure of 3 mmHg. IAS/Shunts: The atrial septum is grossly normal.  LEFT VENTRICLE PLAX 2D LVIDd:         4.50 cm   Diastology LVIDs:         2.60 cm   LV e' medial:    5.98 cm/s LV PW:         0.90 cm   LV  E/e' medial:  8.8 LV IVS:        1.00 cm   LV e' lateral:   9.14 cm/s LVOT diam:     2.20 cm   LV E/e' lateral: 5.8 LV SV:         59 LV SV Index:   30 LVOT Area:     3.80 cm  RIGHT VENTRICLE             IVC RV S prime:     14.50 cm/s  IVC diam: 1.50 cm TAPSE (M-mode): 2.7 cm LEFT ATRIUM             Index        RIGHT ATRIUM           Index LA diam:        3.50 cm 1.79 cm/m   RA Area:     10.80 cm LA Vol (A2C):   42.2 ml 21.53 ml/m  RA Volume:   20.70 ml  10.56 ml/m LA Vol (A4C):   46.4 ml 23.67 ml/m LA Biplane Vol: 44.8 ml 22.86 ml/m  AORTIC VALVE LVOT Vmax:   80.50 cm/s LVOT Vmean:  50.700 cm/s LVOT VTI:    0.155 m  AORTA Ao Root diam: 3.30 cm Ao Asc diam:  3.00 cm MITRAL VALVE  TRICUSPID VALVE MV Area (PHT): 3.03 cm    TR Peak grad:   23.0 mmHg MV Decel Time: 250 msec    TR Vmax:        240.00 cm/s MV E velocity: 52.80 cm/s MV A velocity: 86.60 cm/s  SHUNTS MV E/A ratio:  0.61        Systemic VTI:  0.16 m                            Systemic Diam: 2.20 cm Eleonore Chiquito MD Electronically signed by Eleonore Chiquito MD Signature Date/Time: 06/03/2022/3:23:21 PM    Final     Cardiac Studies   Left heart cath 06/05/22: CONCLUSIONS: 85% heavily calcified proximal to mid LAD stenosis reduced to less than 10% with TIMI grade III flow using a 34 x 3.0 Onyx postdilated to 3.25 at high pressure.  Plaque modification using intravascular lithotripsy 3.5 x 10 device with 10 treatments applied.  Slow flow in a diffusely diseased first diagonal produced ongoing discomfort at the end of the procedure better resolved by the time the patient left the Cath Lab. 35 to 45% distal left main Arteries otherwise have diffuse luminal irregularities but no focal high-grade obstruction. Overall normal LV function with EDP 14 mmHg.   RECOMMENDATIONS:   IV nitroglycerin until chest discomfort resolves.  Flow in the diagonal is improving at the time of procedure completion.  Patient had minimal symptoms at discharge  from the Cath Lab.  EKG showed no ischemic changes. Aggressive risk factor modification. Dual antiplatelet therapy for at least 12 months.  Consider de-escalation of ticagrelor to Plavix at 6 months in addition to aspirin.  At the end of 12 months, consider clopidogrel monotherapy.   Echo 06/03/22: 1. Left ventricular ejection fraction, by estimation, is 60 to 65%. The  left ventricle has normal function. The left ventricle has no regional  wall motion abnormalities. Left ventricular diastolic parameters are  consistent with Grade I diastolic  dysfunction (impaired relaxation).   2. Right ventricular systolic function is normal. The right ventricular  size is normal. There is normal pulmonary artery systolic pressure. The  estimated right ventricular systolic pressure is 94.7 mmHg.   3. The mitral valve is grossly normal. Trivial mitral valve  regurgitation. No evidence of mitral stenosis.   4. The aortic valve is tricuspid. Aortic valve regurgitation is not  visualized. No aortic stenosis is present.   5. The inferior vena cava is normal in size with greater than 50%  respiratory variability, suggesting right atrial pressure of 3 mmHg.   Patient Profile     66 y.o. male  with no significant past medical history who presented with atypical chest and back pain found to have FFR significant prox LAD stenosis, 50% LM disease, 50-69% RCA and LCX stenosis now admitted to Cardiology for cath.  Assessment & Plan    CAD FFR significant stenosis on CCTA Heart cath today with 40% left main and 85% proximal LAD stenosis treated with 3.0 x 30 mmg onyx DES + plaque modification using intravascular lithotripsy x 10 treatments - LVEDP 14 mmHg - slow flow in diffusely disease D1 produced ongoing CP at the end of procedure improved with nitro SL - CP 2/10 post cath --> now resolved, will wean nitro gtt  - ASA and brilinta x 12 months, then plavix + ASA x 6 months, then plavix monotherapy   Renal  insufficiency sCr improved with gentle hydration BMP  in AM   Hyperlipidemia with LDL goal < 55 Lower LDL given left main disease 06/03/2022: Cholesterol 171; HDL 53; LDL Cholesterol 103; Triglycerides 74; VLDL 15 Started on 40 mg crestor LP(a) pending   Disposition Needs to walk with cardiac rehab Will monitor, but given CP after cath, may consider discharge tomorrow morning    For questions or updates, please contact [place] Please consult [email or web address] for contact info under        Signed, Ledora Bottcher, PA  06/05/2022, 10:43 AM    History and all data above reviewed.  Patient examined.  I agree with the findings as above.  Patient denies chest pain since cath.  Denies SOB. The patient exam reveals COR:RRR  ,  Lungs: Clear  ,  Abd: Positive bowel sounds, no rebound no guarding, Ext No edema  .   Right radial band in place.  All available labs, radiology testing, previous records reviewed. Agree with documented assessment and plan. OK to go home tomorrow with.  Discussed activity and diet.  Home with meds as on MAR.  LPa pending.      Jeneen Rinks Larah Kuntzman  1:29 PM  06/05/2022

## 2022-06-05 NOTE — Care Management (Signed)
  Transition of Care [facility]) Screening Note   Patient Details  Name: KEIGEN CADDELL Date of Birth: 05-02-1956   Transition of Care [facility]) CM/SW Contact:    Bethena Roys, RN Phone Number: 06/05/2022, 12:55 PM    Transition of Care Department [facility]) has reviewed the patient and no TOC needs have been identified at this time. We will continue to monitor patient advancement through interdisciplinary progression rounds. If new patient transition needs arise, please place a TOC consult.

## 2022-06-05 NOTE — Telephone Encounter (Signed)
Pharmacy Patient Advocate Encounter  Insurance verification completed.    The patient is insured through [employer] Part D   The patient is currently admitted and ran test claims for the following: Brilinta.  Copays and coinsurance results were relayed to Inpatient clinical team.

## 2022-06-05 NOTE — Progress Notes (Signed)
CARDIAC REHAB PHASE I   PRE:  Rate/Rhythm: 71 SR  BP:  Sitting: 115/75      SaO2: 97  RA  MODE:  Ambulation: 470 ft   POST:  Rate/Rhythm: 74 SR  BP:  Sitting: 116/66      SaO2: 96 RA  Ambulated independently in hall. Tolerated well with no CP or SOB.  Back to room to bed with call bell and bedside table in reach. Post stent teaching including risk factors, heart healthy diet, anti platelet therapy importance,restrictions, site care, exercise guidelines, and CRP2. All questions and concerns addressed. Will refer to [facility] for CRP2. Plan for home tomorrow.    [id number] Vanessa Barbara, RN BSN 06/05/2022 3:08 PM

## 2022-06-05 NOTE — TOC Benefit Eligibility Note (Signed)
Patient Teacher, English as a foreign language completed.    The patient is currently admitted and upon discharge could be taking Brilinta 90 mg.  The current 30 day co-pay is $45.00.   The patient is insured through [place], [place] Patient Advocate Specialist [place] Patient Advocate Team Direct Number: [phone]  Fax: [phone]

## 2022-06-05 NOTE — CV Procedure (Signed)
Scapula and left upper chest discomfort not reproduced by balloon inflation.  Rather central chest tightness occurred with balloon inflations, totally different quality than the presenting symptom. Heavily calcified 20 to 30 mm long proximal to mid LAD stenosis with small diffusely diseased diagonal arising proximal to the lesion. Left main 30 to 40% angiographically. PCI using IVL/shockwave 3.5 for assistance.  Intravascular ultrasound was then used to help determine reference diameters and lesion length.  A 3.0 x 34 Onyx postdilated to 3.25 mm at high pressure using a 20 mm long [place] balloon.  Less than 10% stenosis.  TIMI grade III flow in LAD.  TIMI grade II flow and a diffusely diseased first diagonal that was jailed (source of ongoing pain).  Plan IV nitroglycerin to help improve flow in the diagonal which was jailed.  An additional 2000 heparin was given post procedure completion. Probable discharge in a.m. if no problems.

## 2022-06-06 ENCOUNTER — Other Ambulatory Visit (HOSPITAL_COMMUNITY): Payer: Self-pay

## 2022-06-06 DIAGNOSIS — E785 Hyperlipidemia, unspecified: Secondary | ICD-10-CM

## 2022-06-06 DIAGNOSIS — I200 Unstable angina: Secondary | ICD-10-CM | POA: Diagnosis not present

## 2022-06-06 DIAGNOSIS — I2510 Atherosclerotic heart disease of native coronary artery without angina pectoris: Secondary | ICD-10-CM

## 2022-06-06 LAB — CBC
HCT: 41.4 % (ref 39.0–52.0)
Hemoglobin: 14.2 g/dL (ref 13.0–17.0)
MCH: 33.2 pg (ref 26.0–34.0)
MCHC: 34.3 g/dL (ref 30.0–36.0)
MCV: 96.7 fL (ref 80.0–100.0)
Platelets: 169 10*3/uL (ref 150–400)
RBC: 4.28 MIL/uL (ref 4.22–5.81)
RDW: 13 % (ref 11.5–15.5)
WBC: 6.3 10*3/uL (ref 4.0–10.5)
nRBC: 0 % (ref 0.0–0.2)

## 2022-06-06 LAB — LIPOPROTEIN A (LPA): Lipoprotein (a): 47.2 nmol/L — ABNORMAL HIGH (ref <75.0)

## 2022-06-06 LAB — BASIC METABOLIC PANEL
Anion gap: 8 (ref 5–15)
BUN: 9 mg/dL (ref 8–23)
CO2: 21 mmol/L — ABNORMAL LOW (ref 22–32)
Calcium: 8.8 mg/dL — ABNORMAL LOW (ref 8.9–10.3)
Chloride: 110 mmol/L (ref 98–111)
Creatinine, Ser: 1.06 mg/dL (ref 0.61–1.24)
GFR, Estimated: >60 mL/min (ref >60)
Glucose, Bld: 109 mg/dL — ABNORMAL HIGH (ref 70–99)
Potassium: 3.9 mmol/L (ref 3.5–5.1)
Sodium: 139 mmol/L (ref 135–145)

## 2022-06-06 MED ORDER — TICAGRELOR 90 MG PO TABS
90.0000 mg | ORAL_TABLET | Freq: Two times a day (BID) | ORAL | 3 refills | Status: DC
  Filled 2022-06-06 – 2022-07-05 (×2): qty 60, 30d supply, fill #0
  Filled 2022-08-07: qty 60, 30d supply, fill #1
  Filled 2022-08-31: qty 60, 30d supply, fill #2
  Filled 2022-10-01: qty 60, 30d supply, fill #3
  Filled 2022-11-07: qty 60, 30d supply, fill #4
  Filled 2022-12-03: qty 60, 30d supply, fill #5
  Filled 2023-01-05: qty 60, 30d supply, fill #6
  Filled 2023-02-05: qty 60, 30d supply, fill #7
  Filled 2023-03-07: qty 60, 30d supply, fill #8
  Filled 2023-04-10: qty 60, 30d supply, fill #9
  Filled 2023-05-06: qty 60, 30d supply, fill #10

## 2022-06-06 MED ORDER — ROSUVASTATIN CALCIUM 40 MG PO TABS
40.0000 mg | ORAL_TABLET | Freq: Every day | ORAL | 3 refills | Status: DC
  Filled 2022-06-06 – 2022-08-31 (×2): qty 90, 90d supply, fill #0
  Filled 2022-12-03: qty 90, 90d supply, fill #1
  Filled 2023-03-01: qty 90, 90d supply, fill #2

## 2022-06-06 MED ORDER — ASPIRIN 81 MG PO TBEC
81.0000 mg | DELAYED_RELEASE_TABLET | Freq: Every day | ORAL | 3 refills | Status: DC
  Filled 2022-06-06: qty 30, 30d supply, fill #0
  Filled 2022-12-03: qty 30, 30d supply, fill #1

## 2022-06-06 MED ORDER — NITROGLYCERIN 0.4 MG SL SUBL
0.4000 mg | SUBLINGUAL_TABLET | SUBLINGUAL | 12 refills | Status: DC | PRN
  Filled 2022-06-06 – 2022-07-05 (×2): qty 25, 7d supply, fill #0

## 2022-06-06 NOTE — Care Management Important Message (Signed)
Important Message  Patient Details  Name: Mark Fitzgerald MRN: [id number] Date of Birth: 07-17-1956   Medicare Important Message Given:  Yes     Shelda Altes 06/06/2022, 7:54 AM

## 2022-06-06 NOTE — Discharge Summary (Addendum)
Discharge Summary    Patient ID: Mark Fitzgerald MRN: [id number]; DOB: [birth year]  Admit date: 06/02/2022 Discharge date: 06/06/2022  PCP:  Burnard Bunting, MD   [facility] Providers Cardiologist:  Freada Bergeron, MD     Discharge Diagnoses    Principal Problem:   Unstable angina [facility]) Active Problems:   Precordial chest pain   HLD (hyperlipidemia)   CAD (coronary artery disease), native coronary artery    Diagnostic Studies/Procedures    Coronary CTA 06/02/2022 IMPRESSION: 1. Coronary calcium score of 1659. This was 55 percentile for age and sex matched control.   2. Normal coronary origin with right dominance.   3. Distal Left main 50% stenosis.   4. Severe proximal LAD stenosis 70-99%. FFR 0.75 post lesion, 0.67 distal. Abnormal flow.   5. Moderate RCA and LCX stenosis 50-69%.  (FFR negative)   CAD-RADS 4 Severe stenosis. (70-99% or > 50% left main). Cardiac catheterization is recommended. Consider symptom-guided anti-ischemic pharmacotherapy as well as risk factor modification per guideline directed care.  FFR  1.  Abnormal FFR LAD suggestive of flow limiting CAD   2.  Recommend cardiac catheterization.  Echocardiogram 06/03/2022  1. Left ventricular ejection fraction, by estimation, is 60 to 65%. The  left ventricle has normal function. The left ventricle has no regional  wall motion abnormalities. Left ventricular diastolic parameters are  consistent with Grade I diastolic  dysfunction (impaired relaxation).   2. Right ventricular systolic function is normal. The right ventricular  size is normal. There is normal pulmonary artery systolic pressure. The  estimated right ventricular systolic pressure is 94.4 mmHg.   3. The mitral valve is grossly normal. Trivial mitral valve  regurgitation. No evidence of mitral stenosis.   4. The aortic valve is tricuspid. Aortic valve regurgitation is not  visualized. No aortic stenosis is present.   5. The  inferior vena cava is normal in size with greater than 50%  respiratory variability, suggesting right atrial pressure of 3 mmHg.   Left Heart Catheterization 06/05/2022 CONCLUSIONS: 85% heavily calcified proximal to mid LAD stenosis reduced to less than 10% with TIMI grade III flow using a 34 x 3.0 Onyx postdilated to 3.25 at high pressure.  Plaque modification using intravascular lithotripsy 3.5 x 10 device with 10 treatments applied.  Slow flow in a diffusely diseased first diagonal produced ongoing discomfort at the end of the procedure better resolved by the time the patient left the Cath Lab. 35 to 45% distal left main Arteries otherwise have diffuse luminal irregularities but no focal high-grade obstruction. Overall normal LV function with EDP 14 mmHg.   RECOMMENDATIONS:   IV nitroglycerin until chest discomfort resolves.  Flow in the diagonal is improving at the time of procedure completion.  Patient had minimal symptoms at discharge from the Cath Lab.  EKG showed no ischemic changes. Aggressive risk factor modification. Dual antiplatelet therapy for at least 12 months.  Consider de-escalation of ticagrelor to Plavix at 6 months in addition to aspirin.  At the end of 12 months, consider clopidogrel monotherapy.  Diagnostic Dominance: Right  Intervention   _____________   History of Present Illness     Mark Fitzgerald is a 66 y.o. male without significant PMH. However, patient does have a family history of CAD in brother (MI in 39s). Patient was seen on 8/11 for the evaluation of chest pain, abnormal coronary CT.   On initial interview, patient denied having any prior cardiac history or any known medical problems.  His brother had a heart attack when he was in his 51s.  Patient admitted to being very active and regularly plays tennis.  A few days prior to presentation, patient was at the gym where he had a transient episode of sharp chest pain that lasted about 10 seconds.  Pain went  away, and he was able to carry on like normal.  However, on the morning of presentation, patient woke up with similar type of pain although this time the pain radiated to his back/upper shoulder blades.  Pain was not worse with inspiration, palpation, specific movements.  Chest pain was constant and he still felt it at about a 6/10.  Pain was worse at his shoulder blades.  Patient denied associated symptoms  In the ED, EKGs were nonacute.  Troponins negative x2, CBC WNL, K4.4, creatinine 1.31.  Patient was given 324 mg aspirin, sublingual nitroglycerin, Toradol without significant change.  Coronary CT showed CAC 1659 (93%ile), 50% dLM, 70-99% prox LAD, 50-69% moderate RCA and LCx stenosis -  FFR was abnormal in the LAD suggestive of physiologically significant stenosis. There was no aortic dissection. Cardiology was subsequently consulted for admission to the hospital.   Hospital Course     Consultants: None   CAD - Patient presented to the ED complaining of chest pain. Initial hsTn in the ED negative x2. EKG non-ischemic  - As chest pain was somewhat atypical and as patient had negative troponins, first ordered coronary CT for further evaluation. Coronary CT suggestive of possible obstructive CAD. FFR suggestive of flow limiting CAD. Recommended cardiac catheterization - Patient underwent cardiac catheterization on 8/14 with 40% left main and 85% proximal LAD stenosis. LAD was treated with DES + plaque modification using intravascular lithotripsy x 10 treatments. There was slow flow in diffusely disease D1 which produced ongoing CP at the end of procedure, pain improved with nitro SL - Patient did complain of some mild CP (2/10) post cath. Patient was weaned off nitro and was chest pain free prior to discharge   - Patient should continue DAPT for at least 12 months. Staring with ASA and brilinta. Later, can consider de-escalation of brilinta to plavix at 6 months. Can consider plavix monotherapy  after 12 months of uninterrupted DAPT  - Started on crestor this admission  - Right radial cath site stable prior to discharge. Medications sent to [facility]  - Patient has a follow up appointment on 8/22    Renal insufficiency - Creatinine mildly elevated to 1.31 on presentation - Renal function did improve with gently IV hydration and was down to 1.06 prior to discharge    Hyperlipidemia with LDL goal < 55 - Lower LDL goal given left main disease - Lipid panel from 06/03/2022: Cholesterol 171; HDL 53; LDL Cholesterol 103; Triglycerides 74; VLDL 15 - Patient was started on 40 mg crestor this admission-- will need LFTs and lipid panel in 2-3 months  - LP(a) 47.2   Did the patient have an acute coronary syndrome (MI, NSTEMI, STEMI, etc) this admission?:  No                               Did the patient have a percutaneous coronary intervention (stent / angioplasty)?:  Yes.     Cath/PCI Registry Performance & Quality Measures: Aspirin prescribed? - Yes ADP Receptor Inhibitor (Plavix/Clopidogrel, Brilinta/Ticagrelor or Effient/Prasugrel) prescribed (includes medically managed patients)? - Yes High Intensity Statin (Lipitor 40-'80mg'$  or Crestor 20-'40mg'$ )  prescribed? - Yes For EF <40%, was ACEI/ARB prescribed? - Not Applicable (EF >/= 84%) For EF <40%, Aldosterone Antagonist (Spironolactone or Eplerenone) prescribed? - Not Applicable (EF >/= 16%) Cardiac Rehab Phase II ordered? - Yes   Physical Exam Constitutional:      Appearance: Normal appearance.  HENT:     Head: Normocephalic and atraumatic.     Nose: Nose normal.  Eyes:     Extraocular Movements: Extraocular movements intact.     Conjunctiva/sclera: Conjunctivae normal.  Cardiovascular:     Rate and Rhythm: Normal rate and regular rhythm.     Heart sounds: No murmur heard.    No friction rub. No gallop.     Comments: Right radial cath site soft, nontender. No bleeding noted.  Pulmonary:     Effort: Pulmonary effort is  normal.     Breath sounds: Normal breath sounds.  Abdominal:     General: Abdomen is flat.     Palpations: Abdomen is soft.  Musculoskeletal:     Cervical back: Neck supple.     Right lower leg: No edema.     Left lower leg: No edema.  Skin:    General: Skin is warm and dry.  Neurological:     General: No focal deficit present.     Mental Status: He is alert and oriented to person, place, and time.  Psychiatric:        Mood and Affect: Mood normal.        Behavior: Behavior normal.        Thought Content: Thought content normal.        _____________  Discharge Vitals Blood pressure 118/70, pulse 67, temperature 98.6 F (37 C), temperature source Oral, resp. rate 16, height '5\' 11"'$  (1.803 m), weight 75.5 kg, SpO2 99 %.  Filed Weights   06/03/22 1151 06/04/22 0527 06/05/22 0533  Weight: 76.4 kg 76.3 kg 75.5 kg    Labs & Radiologic Studies    CBC Recent Labs    06/05/22 0636 06/06/22 0331  WBC 5.7 6.3  HGB 14.0 14.2  HCT 41.1 41.4  MCV 96.7 96.7  PLT 172 606   Basic Metabolic Panel Recent Labs    06/05/22 0636 06/06/22 0331  NA 141 139  K 4.1 3.9  CL 109 110  CO2 24 21*  GLUCOSE 99 109*  BUN 10 9  CREATININE 1.05 1.06  CALCIUM 8.9 8.8*   Liver Function Tests No results for input(s): "AST", "ALT", "ALKPHOS", "BILITOT", "PROT", "ALBUMIN" in the last 72 hours. No results for input(s): "LIPASE", "AMYLASE" in the last 72 hours. High Sensitivity Troponin:   Recent Labs  Lab 06/02/22 0846 06/02/22 1043 06/02/22 1617 06/02/22 2047  TROPONINIHS '5 4 5 5    '$ BNP Invalid input(s): "POCBNP" D-Dimer No results for input(s): "DDIMER" in the last 72 hours. Hemoglobin A1C No results for input(s): "HGBA1C" in the last 72 hours. Fasting Lipid Panel No results for input(s): "CHOL", "HDL", "LDLCALC", "TRIG", "CHOLHDL", "LDLDIRECT" in the last 72 hours. Thyroid Function Tests No results for input(s): "TSH", "T4TOTAL", "T3FREE", "THYROIDAB" in the last 72  hours.  Invalid input(s): "FREET3" _____________  CARDIAC CATHETERIZATION  Result Date: 06/05/2022 CONCLUSIONS: 85% heavily calcified proximal to mid LAD stenosis reduced to less than 10% with TIMI grade III flow using a 34 x 3.0 Onyx postdilated to 3.25 at high pressure.  Plaque modification using intravascular lithotripsy 3.5 x 10 device with 10 treatments applied.  Slow flow in a diffusely diseased first diagonal produced  ongoing discomfort at the end of the procedure better resolved by the time the patient left the Cath Lab. 35 to 45% distal left main Arteries otherwise have diffuse luminal irregularities but no focal high-grade obstruction. Overall normal LV function with EDP 14 mmHg. RECOMMENDATIONS: IV nitroglycerin until chest discomfort resolves.  Flow in the diagonal is improving at the time of procedure completion.  Patient had minimal symptoms at discharge from the Cath Lab.  EKG showed no ischemic changes. Aggressive risk factor modification. Dual antiplatelet therapy for at least 12 months.  Consider de-escalation of ticagrelor to Plavix at 6 months in addition to aspirin.  At the end of 12 months, consider clopidogrel monotherapy.   ECHOCARDIOGRAM COMPLETE  Result Date: 06/03/2022    ECHOCARDIOGRAM REPORT   Patient Name:   Mark Fitzgerald Date of Exam: 06/03/2022 Medical Rec #:  [id number]      Height:       71.0 in Accession #:    [id number]     Weight:       168.4 lb Date of Birth:  02-28-1956      BSA:          1.960 m Patient Age:    69 years       BP:           120/80 mmHg Patient Gender: M              HR:           67 bpm. Exam Location:  Inpatient Procedure: 2D Echo Indications:    chest pain  History:        Patient has no prior history of Echocardiogram examinations.                 CAD.  Sonographer:    Johny Chess RDCS Referring Phys: [id number] [place]  1. Left ventricular ejection fraction, by estimation, is 60 to 65%. The left ventricle has normal function. The  left ventricle has no regional wall motion abnormalities. Left ventricular diastolic parameters are consistent with Grade I diastolic dysfunction (impaired relaxation).  2. Right ventricular systolic function is normal. The right ventricular size is normal. There is normal pulmonary artery systolic pressure. The estimated right ventricular systolic pressure is 38.4 mmHg.  3. The mitral valve is grossly normal. Trivial mitral valve regurgitation. No evidence of mitral stenosis.  4. The aortic valve is tricuspid. Aortic valve regurgitation is not visualized. No aortic stenosis is present.  5. The inferior vena cava is normal in size with greater than 50% respiratory variability, suggesting right atrial pressure of 3 mmHg. FINDINGS  Left Ventricle: Left ventricular ejection fraction, by estimation, is 60 to 65%. The left ventricle has normal function. The left ventricle has no regional wall motion abnormalities. The left ventricular internal cavity size was normal in size. There is  no left ventricular hypertrophy. Left ventricular diastolic parameters are consistent with Grade I diastolic dysfunction (impaired relaxation). Right Ventricle: The right ventricular size is normal. No increase in right ventricular wall thickness. Right ventricular systolic function is normal. There is normal pulmonary artery systolic pressure. The tricuspid regurgitant velocity is 2.40 m/s, and  with an assumed right atrial pressure of 3 mmHg, the estimated right ventricular systolic pressure is 66.5 mmHg. Left Atrium: Left atrial size was normal in size. Right Atrium: Right atrial size was normal in size. Pericardium: There is no evidence of pericardial effusion. Mitral Valve: The mitral valve is grossly normal. Trivial mitral valve  regurgitation. No evidence of mitral valve stenosis. Tricuspid Valve: The tricuspid valve is grossly normal. Tricuspid valve regurgitation is trivial. No evidence of tricuspid stenosis. Aortic Valve: The  aortic valve is tricuspid. Aortic valve regurgitation is not visualized. No aortic stenosis is present. Pulmonic Valve: The pulmonic valve was grossly normal. Pulmonic valve regurgitation is trivial. No evidence of pulmonic stenosis. Aorta: The aortic root and ascending aorta are structurally normal, with no evidence of dilitation. Venous: The inferior vena cava is normal in size with greater than 50% respiratory variability, suggesting right atrial pressure of 3 mmHg. IAS/Shunts: The atrial septum is grossly normal.  LEFT VENTRICLE PLAX 2D LVIDd:         4.50 cm   Diastology LVIDs:         2.60 cm   LV e' medial:    5.98 cm/s LV PW:         0.90 cm   LV E/e' medial:  8.8 LV IVS:        1.00 cm   LV e' lateral:   9.14 cm/s LVOT diam:     2.20 cm   LV E/e' lateral: 5.8 LV SV:         59 LV SV Index:   30 LVOT Area:     3.80 cm  RIGHT VENTRICLE             IVC RV S prime:     14.50 cm/s  IVC diam: 1.50 cm TAPSE (M-mode): 2.7 cm LEFT ATRIUM             Index        RIGHT ATRIUM           Index LA diam:        3.50 cm 1.79 cm/m   RA Area:     10.80 cm LA Vol (A2C):   42.2 ml 21.53 ml/m  RA Volume:   20.70 ml  10.56 ml/m LA Vol (A4C):   46.4 ml 23.67 ml/m LA Biplane Vol: 44.8 ml 22.86 ml/m  AORTIC VALVE LVOT Vmax:   80.50 cm/s LVOT Vmean:  50.700 cm/s LVOT VTI:    0.155 m  AORTA Ao Root diam: 3.30 cm Ao Asc diam:  3.00 cm MITRAL VALVE               TRICUSPID VALVE MV Area (PHT): 3.03 cm    TR Peak grad:   23.0 mmHg MV Decel Time: 250 msec    TR Vmax:        240.00 cm/s MV E velocity: 52.80 cm/s MV A velocity: 86.60 cm/s  SHUNTS MV E/A ratio:  0.61        Systemic VTI:  0.16 m                            Systemic Diam: 2.20 cm Eleonore Chiquito MD Electronically signed by Eleonore Chiquito MD Signature Date/Time: 06/03/2022/3:23:21 PM    Final    CT CORONARY FRACTIONAL FLOW RESERVE DATA PREP  Result Date: 06/02/2022 EXAM: FFRCT ANALYSIS FINDINGS: FFRct analysis was performed on the original cardiac CT angiogram dataset.  Diagrammatic representation of the FFRct analysis is provided in a separate PDF document in PACS. This dictation was created using the PDF document and an interactive 3D model of the results. 3D model is not available in the EMR/PACS. Normal FFR range is >0.80. 1. Left Main: 1.0 to 0.95 2. LAD: 0.95 to 0.75 proximal  to mid, 0.67 distal 3. LCX: 0.9 mid, 0.79 distal 4. Ramus: NA 5. RCA: 1.0 to 0.91 IMPRESSION: 1.  Abnormal FFR LAD suggestive of flow limiting CAD 2.  Recommend cardiac catheterization. Note: These examples are not recommendations of [employer] and only provided as examples of what other customers are doing. Electronically Signed   By: Candee Furbish M.D.   On: 06/02/2022 15:34   CT CORONARY FRACTIONAL FLOW RESERVE FLUID ANALYSIS  Result Date: 06/02/2022 EXAM: FFRCT ANALYSIS FINDINGS: FFRct analysis was performed on the original cardiac CT angiogram dataset. Diagrammatic representation of the FFRct analysis is provided in a separate PDF document in PACS. This dictation was created using the PDF document and an interactive 3D model of the results. 3D model is not available in the EMR/PACS. Normal FFR range is >0.80. 1. Left Main: 1.0 to 0.95 2. LAD: 0.95 to 0.75 proximal to mid, 0.67 distal 3. LCX: 0.9 mid, 0.79 distal 4. Ramus: NA 5. RCA: 1.0 to 0.91 IMPRESSION: 1.  Abnormal FFR LAD suggestive of flow limiting CAD 2.  Recommend cardiac catheterization. Note: These examples are not recommendations of [employer] and only provided as examples of what other customers are doing. Electronically Signed   By: Candee Furbish M.D.   On: 06/02/2022 15:34   CT CORONARY MORPH W/CTA COR W/SCORE W/CA W/CM &/OR WO/CM  Addendum Date: 06/02/2022   ADDENDUM REPORT: 06/02/2022 15:25 CLINICAL DATA:  66 year old with chest pain EXAM: Cardiac/Coronary  CTA TECHNIQUE: The patient was scanned on a [employer]. FINDINGS: A 120 kV prospective scan was triggered in the descending thoracic aorta at 111 HU's. Axial  non-contrast 3 mm slices were carried out through the heart. The data set was analyzed on a dedicated work station and scored using the [place]. Gantry rotation speed was 250 msecs and collimation was .6 mm. 0.8 mg of sl NTG was given. The 3D data set was reconstructed in 5% intervals of the 67-82 % of the R-R cycle. Diastolic phases were analyzed on a dedicated work station using MPR, MIP and VRT modes. The patient received 80 cc of contrast. Image quality: good Aorta:  Normal size.  No calcifications.  No dissection. Aortic Valve: No calcifications. Coronary Arteries:  Normal coronary origin.  Right dominance. RCA is a large dominant artery that gives rise to PDA and PLA. There is scattered calcified plaque, 50-69% stenosis prox/mid. Mild 30-49% distal. Left main is a large artery that gives rise to LAD and LCX arteries. There is 50% stenosis distal left main. LAD is a large vessel that has scattered calcified and non calcified plaque. There is severe 70-99% stenosis proximally, mild 30-49% stenosis distally. LCX is a non-dominant artery that gives rise to one large OM1 branch. There is scattered calcified plaque. Moderate 50-69% stenosis prox/mid circ. Other findings: Normal pulmonary vein drainage into the left atrium. Normal left atrial appendage without a thrombus. Normal size of the pulmonary artery. Please see radiology report for non cardiac findings. IMPRESSION: 1. Coronary calcium score of 1659. This was 13 percentile for age and sex matched control. 2. Normal coronary origin with right dominance. 3. Distal Left main 50% stenosis. 4. Severe proximal LAD stenosis 70-99%. FFR 0.75 post lesion, 0.67 distal. Abnormal flow. 5. Moderate RCA and LCX stenosis 50-69%.  (FFR negative) CAD-RADS 4 Severe stenosis. (70-99% or > 50% left main). Cardiac catheterization is recommended. Consider symptom-guided anti-ischemic pharmacotherapy as well as risk factor modification per guideline directed care.  Electronically Signed   By:  Candee Furbish M.D.   On: 06/02/2022 15:25   Result Date: 06/02/2022 EXAM: OVER-READ INTERPRETATION  CT CHEST The following report is a limited chest CT over-read performed by radiologist Dr. Yetta Glassman of [facility], [place] on 06/02/2022. This over-read does not include interpretation of cardiac or coronary anatomy or pathology. The cardiac CTA interpretation by the cardiologist is attached. COMPARISON:  None Available. FINDINGS: Vascular: Normal heart size. No pericardial effusion. No suspicious filling defects of the central pulmonary arteries. Mediastinum/Nodes: Esophagus is unremarkable. No pathologically enlarged lymph nodes seen in the chest. Lungs/Pleura: Lungs are clear. No pleural effusion or pneumothorax. Upper Abdomen: No acute abnormality. Musculoskeletal: No chest wall mass or suspicious bone lesions identified. IMPRESSION: No significant extracardiac abnormality. Electronically Signed: By: Yetta Glassman M.D. On: 06/02/2022 14:53   DG Chest 2 View  Result Date: 06/02/2022 CLINICAL DATA:  Chest pain. EXAM: CHEST - 2 VIEW COMPARISON:  None Available. FINDINGS: Both lungs are clear. Heart and mediastinum are within normal limits. Trachea is midline. No large pleural effusions. No acute bone abnormality. Negative for a pneumothorax. IMPRESSION: No active cardiopulmonary disease. Electronically Signed   By: Markus Daft M.D.   On: 06/02/2022 09:03    Disposition   Pt is being discharged home today in good condition.  Follow-up Plans & Appointments     Follow-up Information     Charlie Pitter, PA-C Follow up on 06/13/2022.   Specialties: Cardiology, Radiology Why: Appointment at 1:55 PM Contact information: [address] [place] [address] [place] [address] [phone]                Discharge Instructions     Amb Referral to Cardiac Rehabilitation   Complete by: As directed    Diagnosis: Coronary Stents   After initial  evaluation and assessments completed: Virtual Based Care may be provided alone or in conjunction with Phase 2 Cardiac Rehab based on patient barriers.: Yes   Call MD for:  persistant dizziness or light-headedness   Complete by: As directed    Call MD for:  redness, tenderness, or signs of infection (pain, swelling, redness, odor or green/yellow discharge around incision site)   Complete by: As directed    Call MD for:  severe uncontrolled pain   Complete by: As directed    Diet - low sodium heart healthy   Complete by: As directed    Discharge instructions   Complete by: As directed    Radial Site Care Refer to this sheet in the next few weeks. These instructions provide you with information on caring for yourself after your procedure. Your caregiver may also give you more specific instructions. Your treatment has been planned according to current medical practices, but problems sometimes occur. Call your caregiver if you have any problems or questions after your procedure. HOME CARE INSTRUCTIONS You may shower the day after the procedure. Remove the bandage (dressing) and gently wash the site with plain soap and water. Gently pat the site dry.  Do not apply powder or lotion to the site.  Do not submerge the affected site in water for 3 to 5 days.  Inspect the site at least twice daily.  Do not flex or bend the affected arm for 24 hours.  No lifting over 5 pounds (2.3 kg) for 5 days after your procedure.  Do not drive home if you are discharged the same day of the procedure. Have someone else drive you.  You may drive 24 hours after the  procedure unless otherwise instructed by your caregiver.  What to expect: Any bruising will usually fade within 1 to 2 weeks.  Blood that collects in the tissue (hematoma) may be painful to the touch. It should usually decrease in size and tenderness within 1 to 2 weeks.  SEEK IMMEDIATE MEDICAL CARE IF: You have unusual pain at the radial site.  You have  redness, warmth, swelling, or pain at the radial site.  You have drainage (other than a small amount of blood on the dressing).  You have chills.  You have a fever or persistent symptoms for more than 72 hours.  You have a fever and your symptoms suddenly get worse.  Your arm becomes pale, cool, tingly, or numb.  You have heavy bleeding from the site. Hold pressure on the site.    PLEASE DO NOT MISS ANY DOSES OF YOUR BRILINTA!!!!! Also keep a log of you blood pressures and bring back to your follow up appt. Please call the office with any questions.   Patients taking blood thinners should generally stay away from medicines like ibuprofen, Advil, Motrin, naproxen, and Aleve due to risk of stomach bleeding. You may take Tylenol as directed or talk to your primary doctor about alternatives.  PLEASE ENSURE THAT YOU DO NOT RUN OUT OF YOUR BRILINTA. This medication is very important to remain on for at least one year. IF you have issues obtaining this medication due to cost please CALL the office 3-5 business days prior to running out in order to prevent missing doses of this medication.   Increase activity slowly   Complete by: As directed        Discharge Medications   Allergies as of 06/06/2022   No Known Allergies      Medication List     STOP taking these medications    nitroGLYCERIN 0.2 mg/hr patch Commonly known as: NITRODUR - Dosed in mg/24 hr Replaced by: nitroGLYCERIN 0.4 MG SL tablet       TAKE these medications    aspirin EC 81 MG tablet Take 1 tablet (81 mg total) by mouth daily. Swallow whole. Start taking on: June 07, 2022   multivitamin with minerals tablet Take 1 tablet by mouth daily.   nitroGLYCERIN 0.4 MG SL tablet Commonly known as: NITROSTAT Place 1 tablet (0.4 mg total) under the tongue every 5 (five) minutes as needed for chest pain. For up to 3 doses Replaces: nitroGLYCERIN 0.2 mg/hr patch   rosuvastatin 40 MG tablet Commonly known as:  CRESTOR Take 1 tablet (40 mg total) by mouth daily. Start taking on: June 07, 2022   tamsulosin 0.4 MG Caps capsule Commonly known as: FLOMAX Take 0.4 mg by mouth daily.   ticagrelor 90 MG Tabs tablet Commonly known as: BRILINTA Take 1 tablet (90 mg total) by mouth 2 (two) times daily.           Outstanding Labs/Studies   LFTs and lipid panel in 2-3 months   Duration of Discharge Encounter   Greater than 30 minutes including physician time.  Signed, Margie Billet, PA-C 06/06/2022, 9:57 AM  History and all data above reviewed.  Patient examined.  I agree with the findings as above.   OK to go home.  Mild shoulder pain.  Breathing OK.   The patient exam reveals COR:RRR  ,  Lungs: Clear  ,  Abd: Positive bowel sounds, no rebound no guarding, Ext Right radial site without bleeding or bruising  .  All available  labs, radiology testing, previous records reviewed. Agree with documented assessment and plan. OK to discharge on meds as listed with follow as planned.    Minus Breeding  9:59 AM  06/06/2022

## 2022-06-06 NOTE — Progress Notes (Addendum)
CARDIAC REHAB PHASE I    Pt had a good night. No problems with ambulation and moving about room. Reviewed home education provided yesterday with pt and wife. All questions and concerns addressed. Discharge home today.    [phone] Vanessa Barbara, RN BSN 06/06/2022 10:06 AM

## 2022-06-11 NOTE — Progress Notes (Unsigned)
Cardiology Office Note:    Date:  06/13/2022   ID:  Mark Fitzgerald, DOB 12-11-1955, MRN [id number]  PCP:  Burnard Bunting, MD   [place] Providers Cardiologist:  Freada Bergeron, MD     Referring MD: Burnard Bunting, MD   F/u CAD  History of Present Illness:    Mark Fitzgerald is a 66 y.o. male with a hx of:  CAD, s/p DES and lithotripsy to LAD HLD Family hx of CAD Renal insufficiency, mild, present on admission pre-cath  He presented to the ED on June 02, 2022 for the evaluation of chest pain/back pain between his shoulder blades. Previously he had no prior cardiac history or no known medical problems.  Experienced sharp chest pain while he was at the gym that was transient and lasted about 10 seconds.  Woke up the next morning with pain that was radiating to back/upper shoulder blades.  This pain remained constant and was rated 6 out of 10.  EKGs were nonacute.  He denied any associated symptoms.  Troponins were negative. Coronary CT showed coronary calcium score of 1659; 93rd percentile, 50% dLM, 70-99% proximal LAD, 50 to 69% moderate RCA and left circumflex stenosis-FFR was abnormal in the LAD suggestive of physiologically significant stenosis.  No aortic dissection noted. Given negative troponins despite persistent pain it was unclear that his acute symptoms were actually ischemic in etiology but was recommended that patient undergo cardiac catheterization.  He underwent left heart cath on June 05, 2022 that revealed 85% heavily calcified proximal to mid LAD stenosis reduced to less than 10% with TIMI grade III flow using a 34 x 3.0 Onyx postdilated to 3.25 at high pressure.  Plaque modification using intravascular lithotripsy 3.5 x 10 device with 10 treatments applied.  Slow flow in a diffusely diseased first diagonal produced ongoing discomfort at the end of the procedure better resolved by the time the patient left the Cath Lab. 35 to 45% distal left main,  arteries otherwise had diffuse luminal irregularities but no focal high-grade obstruction, overall normal LV function with EDP 14 mmHg. There is mention of residual chest pain post-cath as well, 2/10. Dual antiplatelet therapy recommended for at least 12 months with consideration of de-escalation of ticagrelor to Plavix at 6 months in addition to aspirin. At the end of 12 months, consider clopidogrel monotherapy.   Today he presents for follow up. He states he has had some vague chest tenderness located left side of his chest, 2 out of 10 in intensity, that has been constant over the past week. This is not made worse with inspiration or exertion. He was not specifically concerned about this and it was not as severe as the original symptoms that prompted evaluation. Overall he is feeling well. Denies any other associated symptoms with this.  Denies any shortness of breath, fatigue, weakness, syncope/presyncope, dizziness, lightheadedness, bleeding, orthopnea/PND, swelling, or significant weight changes.  Wants to know when he can return to playing tennis.  Denies any other questions or concerns today.   Past Medical History:  Diagnosis Date   Healthy adult on routine physical examination     Past Surgical History:  Procedure Laterality Date   BACK SURGERY     bicep surgery Right    shoulder   COLONOSCOPY     CORONARY LITHOTRIPSY N/A 06/05/2022   Procedure: CORONARY LITHOTRIPSY;  Surgeon: Belva Crome, MD;  Location: [facility];  Service: Cardiovascular;  Laterality: N/A;   CORONARY STENT INTERVENTION  N/A 06/05/2022   Procedure: CORONARY STENT INTERVENTION;  Surgeon: Belva Crome, MD;  Location: [facility];  Service: Cardiovascular;  Laterality: N/A;   INTRAVASCULAR ULTRASOUND/IVUS N/A 06/05/2022   Procedure: Intravascular Ultrasound/IVUS;  Surgeon: Belva Crome, MD;  Location: [facility];  Service: Cardiovascular;  Laterality: N/A;   LEFT HEART CATH AND CORONARY  ANGIOGRAPHY N/A 06/05/2022   Procedure: LEFT HEART CATH AND CORONARY ANGIOGRAPHY;  Surgeon: Belva Crome, MD;  Location: [facility];  Service: Cardiovascular;  Laterality: N/A;   WRIST SURGERY Right     Current Medications: Current Meds  Medication Sig   aspirin EC 81 MG tablet Take 1 tablet (81 mg total) by mouth daily. Swallow whole.   Multiple Vitamins-Minerals (MULTIVITAMIN WITH MINERALS) tablet Take 1 tablet by mouth daily.   nitroGLYCERIN (NITROSTAT) 0.4 MG SL tablet Place 1 tablet (0.4 mg total) under the tongue every 5 (five) minutes as needed for chest pain. For up to 3 doses   rosuvastatin (CRESTOR) 40 MG tablet Take 1 tablet (40 mg total) by mouth daily.   tamsulosin (FLOMAX) 0.4 MG CAPS capsule Take 0.4 mg by mouth daily.   ticagrelor (BRILINTA) 90 MG TABS tablet Take 1 tablet (90 mg total) by mouth 2 (two) times daily.     Allergies:   Patient has no known allergies.   Social History   Socioeconomic History   Marital status: Married    Spouse name: Not on file   Number of children: Not on file   Years of education: Not on file   Highest education level: Not on file  Occupational History   Not on file  Tobacco Use   Smoking status: Never   Smokeless tobacco: Never  Vaping Use   Vaping Use: Never used  Substance and Sexual Activity   Alcohol use: Yes    Comment: social    Drug use: Never   Sexual activity: Not on file  Other Topics Concern   Not on file  Social History Narrative   Not on file   Social Determinants of Health   Financial Resource Strain: Not on file  Food Insecurity: Not on file  Transportation Needs: Not on file  Physical Activity: Not on file  Stress: Not on file  Social Connections: Not on file     Family History: The patient's family history includes Cancer in his mother; Heart attack in his brother; Pancreatic cancer in his father. There is no history of Colon cancer, Colon polyps, Esophageal cancer, Stomach cancer, or  Rectal cancer.  ROS:   Review of Systems  Constitutional:  Negative for chills, diaphoresis, fever, malaise/fatigue and weight loss.  HENT: Negative.    Eyes: Negative.   Respiratory: Negative.    Cardiovascular:  Positive for chest pain. Negative for palpitations, orthopnea, claudication, leg swelling and PND.  Gastrointestinal: Negative.   Genitourinary: Negative.   Musculoskeletal: Negative.   Skin: Negative.   Neurological: Negative.   Endo/Heme/Allergies: Negative.   Psychiatric/Behavioral: Negative.     Please see the history of present illness.    All other systems reviewed and are negative.  EKGs/Labs/Other Studies Reviewed:    The following studies were reviewed today:   EKG:  EKG is ordered today.  The ekg ordered today demonstrates normal sinus rhythm, 81 bpm, otherwise no acute changes.   Recent Labs: 06/02/2022: ALT 19; TSH 1.529 06/06/2022: BUN 9; Creatinine, Ser 1.06; Hemoglobin 14.2; Platelets 169; Potassium 3.9; Sodium 139  Recent Lipid Panel  Component Value Date/Time   CHOL 171 06/03/2022 0254   TRIG 74 06/03/2022 0254   HDL 53 06/03/2022 0254   CHOLHDL 3.2 06/03/2022 0254   VLDL 15 06/03/2022 0254   LDLCALC 103 (H) 06/03/2022 0254   Physical Exam:    VS:  BP 110/74   Pulse 81   Ht '5\' 11"'$  (1.803 m)   Wt 166 lb 9.6 oz (75.6 kg)   SpO2 95%   BMI 23.24 kg/m     Wt Readings from Last 3 Encounters:  06/13/22 166 lb 9.6 oz (75.6 kg)  06/05/22 166 lb 8 oz (75.5 kg)  02/10/21 171 lb (77.6 kg)     GEN: Well nourished, well developed in no acute distress HEENT: Normal NECK: No JVD; No carotid bruits CARDIAC: RRR, no murmurs, rubs, gallops: 2+ peripheral pulses throughout, strong and equal bilaterally.  Right radial site is clean, dry, and intact with minimal bruising surrounding, no erythema or warmth noted. RESPIRATORY:  Clear and diminished to auscultation without rales, wheezing or rhonchi  ABDOMEN: Soft, non-tender, non-distended, bowel sounds  x4 MUSCULOSKELETAL:  No edema; No deformity  SKIN: Warm and dry NEUROLOGIC:  Alert and oriented x 3 PSYCHIATRIC:  Normal affect   ASSESSMENT:    1. Chest wall tenderness   2. Unstable angina ([place])   3. Coronary artery disease involving native heart without angina pectoris, unspecified vessel or lesion type   4. S/P coronary artery stent placement   5. Hyperlipidemia, unspecified hyperlipidemia type    PLAN:    In order of problems listed above:  Chest tenderness, recent unstable angina, status post DES and lithotripsy to LAD-recent, stable Very mild (2/10) constant chest tenderness that has occurred over the last week since his hospitalization. It is unclear whether this is related to his coronary status at all. He similarly had persistent high grade discomfort in the hospital without associated troponin elevation or EKG changes, raising question of whether the original pain was non-anginal. He also had similar residual pain post-PCI. Recent CTA also did not show any dissection or thoracic pathology. His EKG today is stable but given residual CAD, we will initiate 15 mg of Imdur daily.  Discussed to monitor blood pressure daily after initiating this medicine, and discussed if his top number drops less than 100 and he develops dizziness/lightheadedness/syncope/presyncope to let our office know immediately.  Discussed ED precautions, and he verbalizes understanding.  Heart healthy, low salt diet recommended.  We will follow him up closely in 2 to 3 weeks with APP.  At next follow-up visit, we will reevaluate to see if he is ready for cardiac rehab.  2. HLD with LDL goal < 70 - chronic, stable Last LDL was 103 on 06/03/2022.  Goal is LDL goal to be less than 70.  He was started on Crestor during hospitalization.  We will obtain fasting lipid panel and liver enzymes in 2 months.  Continue current medication regimen.  3.  Renal insufficiency - initial Cr was 1.31 upon arrival prior to any studies.  This normalized with hydration and was normal at discharge. Can continue to follow with PCP.  4. Disposition: Follow-up with APP in 2 to 3 weeks to reevaluate chest tenderness, follow-up with Dr. Johney Frame in 6 months.   Medication Adjustments/Labs and Tests Ordered: Current medicines are reviewed at length with the patient today.  Concerns regarding medicines are outlined above.  Orders Placed This Encounter  Procedures   Hepatic function panel   Lipid panel   EKG  12-Lead   Meds ordered this encounter  Medications   isosorbide mononitrate (IMDUR) 30 MG 24 hr tablet    Sig: Take 0.5 tablets (15 mg total) by mouth daily.    Dispense:  30 tablet    Refill:  2    Patient Instructions  Medication Instructions:  START Imdur '15mg'$  take 1 tablet once a day  *If you need a refill on your cardiac medications before your next appointment, please call your pharmacy*   Lab Work: FASTING LFTS, LIPIDS IN 2 MONTHS If you have labs (blood work) drawn today and your tests are completely normal, you will receive your results only by: [place] (if you have MyChart) OR A paper copy in the mail If you have any lab test that is abnormal or we need to change your treatment, we will call you to review the results.   Testing/Procedures: None Ordered   Follow-Up: At [facility], you and your health needs are our priority.  As part of our continuing mission to provide you with exceptional heart care, we have created designated Provider Care Teams.  These Care Teams include your primary Cardiologist (physician) and Advanced Practice Providers (APPs -  Physician Assistants and Nurse Practitioners) who all work together to provide you with the care you need, when you need it.  We recommend signing up for the patient portal called "MyChart".  Sign up information is provided on this After Visit Summary.  MyChart is used to connect with patients for Virtual Visits (Telemedicine).  Patients are able  to view lab/test results, encounter notes, upcoming appointments, etc.  Non-urgent messages can be sent to your provider as well.   To learn more about what you can do with MyChart, go to [email or web address].    Your next appointment:   2-3 week(s)  The format for your next appointment:   In Person  Provider:   Robbie Lis, PA-C, Nicholes Rough, PA-C, Melina Copa, PA-C, Ambrose Pancoast, NP, Cecilie Kicks, NP, Ermalinda Barrios, PA-C, Christen Bame, NP, or Richardson Dopp, PA-C     Then, Freada Bergeron, MD will plan to see you again in 6 month(s).    Other Instructions KEEP BLOOD PRESSURE LOG  CONTACT THE OFFICE IF YOUR BLOOD PRESSURE TOP NUMBER IS UNDER 100  Important Information About Sugar         Signed, Finis Bud, NP  06/13/2022 2:55 PM    [place]

## 2022-06-13 ENCOUNTER — Ambulatory Visit (INDEPENDENT_AMBULATORY_CARE_PROVIDER_SITE_OTHER): Payer: PPO | Admitting: Nurse Practitioner

## 2022-06-13 ENCOUNTER — Encounter: Payer: Self-pay | Admitting: Nurse Practitioner

## 2022-06-13 VITALS — BP 110/74 | HR 81 | Ht 71.0 in | Wt 166.6 lb

## 2022-06-13 DIAGNOSIS — N289 Disorder of kidney and ureter, unspecified: Secondary | ICD-10-CM | POA: Diagnosis not present

## 2022-06-13 DIAGNOSIS — I2510 Atherosclerotic heart disease of native coronary artery without angina pectoris: Secondary | ICD-10-CM | POA: Diagnosis not present

## 2022-06-13 DIAGNOSIS — E785 Hyperlipidemia, unspecified: Secondary | ICD-10-CM

## 2022-06-13 DIAGNOSIS — Z955 Presence of coronary angioplasty implant and graft: Secondary | ICD-10-CM

## 2022-06-13 DIAGNOSIS — R0789 Other chest pain: Secondary | ICD-10-CM

## 2022-06-13 DIAGNOSIS — I200 Unstable angina: Secondary | ICD-10-CM

## 2022-06-13 MED ORDER — ISOSORBIDE MONONITRATE ER 30 MG PO TB24: 15.0000 mg | ORAL_TABLET | Freq: Every day | ORAL | 2 refills | Status: DC

## 2022-06-13 NOTE — Patient Instructions (Addendum)
Medication Instructions:  START Imdur '15mg'$  take 1 tablet once a day  *If you need a refill on your cardiac medications before your next appointment, please call your pharmacy*   Lab Work: FASTING LFTS, LIPIDS IN 2 MONTHS If you have labs (blood work) drawn today and your tests are completely normal, you will receive your results only by: [facility] (if you have MyChart) OR A paper copy in the mail If you have any lab test that is abnormal or we need to change your treatment, we will call you to review the results.   Testing/Procedures: None Ordered   Follow-Up: At [facility], you and your health needs are our priority.  As part of our continuing mission to provide you with exceptional heart care, we have created designated Provider Care Teams.  These Care Teams include your primary Cardiologist (physician) and Advanced Practice Providers (APPs -  Physician Assistants and Nurse Practitioners) who all work together to provide you with the care you need, when you need it.  We recommend signing up for the patient portal called "MyChart".  Sign up information is provided on this After Visit Summary.  MyChart is used to connect with patients for Virtual Visits (Telemedicine).  Patients are able to view lab/test results, encounter notes, upcoming appointments, etc.  Non-urgent messages can be sent to your provider as well.   To learn more about what you can do with MyChart, go to [email or web address].    Your next appointment:   2-3 week(s)  The format for your next appointment:   In Person  Provider:   Robbie Lis, PA-C, Nicholes Rough, PA-C, Melina Copa, PA-C, Ambrose Pancoast, NP, Cecilie Kicks, NP, Ermalinda Barrios, PA-C, Christen Bame, NP, or Richardson Dopp, PA-C     Then, Freada Bergeron, MD will plan to see you again in 6 month(s).    Other Instructions KEEP BLOOD PRESSURE LOG  CONTACT THE OFFICE IF YOUR BLOOD PRESSURE TOP NUMBER IS UNDER 100  Important Information About  Sugar

## 2022-06-22 DIAGNOSIS — L812 Freckles: Secondary | ICD-10-CM | POA: Diagnosis not present

## 2022-06-22 DIAGNOSIS — L308 Other specified dermatitis: Secondary | ICD-10-CM | POA: Diagnosis not present

## 2022-06-22 DIAGNOSIS — L918 Other hypertrophic disorders of the skin: Secondary | ICD-10-CM | POA: Diagnosis not present

## 2022-06-22 DIAGNOSIS — L821 Other seborrheic keratosis: Secondary | ICD-10-CM | POA: Diagnosis not present

## 2022-06-27 ENCOUNTER — Encounter: Payer: Self-pay | Admitting: Physician Assistant

## 2022-06-27 ENCOUNTER — Ambulatory Visit: Payer: PPO | Attending: Physician Assistant | Admitting: Physician Assistant

## 2022-06-27 VITALS — BP 90/60 | HR 73 | Ht 71.0 in | Wt 170.0 lb

## 2022-06-27 DIAGNOSIS — R5383 Other fatigue: Secondary | ICD-10-CM

## 2022-06-27 DIAGNOSIS — E785 Hyperlipidemia, unspecified: Secondary | ICD-10-CM | POA: Diagnosis not present

## 2022-06-27 DIAGNOSIS — I9589 Other hypotension: Secondary | ICD-10-CM | POA: Diagnosis not present

## 2022-06-27 DIAGNOSIS — I2510 Atherosclerotic heart disease of native coronary artery without angina pectoris: Secondary | ICD-10-CM | POA: Diagnosis not present

## 2022-06-27 NOTE — Progress Notes (Signed)
Cardiology Office Note:    Date:  06/27/2022   ID:  SHERIDAN GETTEL, DOB 1955-12-25, MRN [id number]  PCP:  Burnard Bunting, MD  [facility] Cardiologist:  Freada Bergeron, MD  [facility] Electrophysiologist:  None   Chief Complaint: 2 weeks follow up  History of Present Illness:    Mark Fitzgerald is a 66 y.o. male with a hx of CAD, HLD and mild renal renal insufficiency presented for follow-up.  Presented to hospital August 2023 for chest pain.  Coronary CT was abnormal.  Underwent cardiac catheterization showing 40% left main and 85% proximal LAD stenosis. LAD was treated with DES + plaque modification using intravascular lithotripsy x 10 treatments. There was slow flow in diffusely disease D1 which produced ongoing CP at the end of procedure, pain improved with nitro SL.  Plan for dual antiplatelet therapy 12 months.  Placed on high intensity statin.  During follow-up patient continues to have mild chest discomfort.  Imdur added.  Here today for follow-up.  Patient reports no recurrent chest pain but dealing with severe headache and fatigue.  Like to play tennis again.  Prior to his stenting he had left-sided chest pain radiating to his shoulder blade.  No similar symptoms.  Reports compliance with medication.  Noted soft blood pressure.  Past Medical History:  Diagnosis Date   CAD (coronary artery disease)    s/p LAD stent 05/2022   Healthy adult on routine physical examination    Hyperlipidemia LDL goal <70     Past Surgical History:  Procedure Laterality Date   BACK SURGERY     bicep surgery Right    shoulder   COLONOSCOPY     CORONARY LITHOTRIPSY N/A 06/05/2022   Procedure: CORONARY LITHOTRIPSY;  Surgeon: Belva Crome, MD;  Location: [place] [facility];  Service: Cardiovascular;  Laterality: N/A;   CORONARY STENT INTERVENTION N/A 06/05/2022   Procedure: CORONARY STENT INTERVENTION;  Surgeon: Belva Crome, MD;  Location: [place] [facility];  Service:  Cardiovascular;  Laterality: N/A;   INTRAVASCULAR ULTRASOUND/IVUS N/A 06/05/2022   Procedure: Intravascular Ultrasound/IVUS;  Surgeon: Belva Crome, MD;  Location: [place] [facility];  Service: Cardiovascular;  Laterality: N/A;   LEFT HEART CATH AND CORONARY ANGIOGRAPHY N/A 06/05/2022   Procedure: LEFT HEART CATH AND CORONARY ANGIOGRAPHY;  Surgeon: Belva Crome, MD;  Location: [place] [facility];  Service: Cardiovascular;  Laterality: N/A;   WRIST SURGERY Right     Current Medications: Current Meds  Medication Sig   aspirin EC 81 MG tablet Take 1 tablet (81 mg total) by mouth daily. Swallow whole.   Multiple Vitamins-Minerals (MULTIVITAMIN WITH MINERALS) tablet Take 1 tablet by mouth daily.   nitroGLYCERIN (NITROSTAT) 0.4 MG SL tablet Place 1 tablet (0.4 mg total) under the tongue every 5 (five) minutes as needed for chest pain. For up to 3 doses   rosuvastatin (CRESTOR) 40 MG tablet Take 1 tablet (40 mg total) by mouth daily.   tamsulosin (FLOMAX) 0.4 MG CAPS capsule Take 0.4 mg by mouth daily.   ticagrelor (BRILINTA) 90 MG TABS tablet Take 1 tablet (90 mg total) by mouth 2 (two) times daily.   [DISCONTINUED] isosorbide mononitrate (IMDUR) 30 MG 24 hr tablet Take 0.5 tablets (15 mg total) by mouth daily.     Allergies:   Patient has no known allergies.   Social History   Socioeconomic History   Marital status: Married    Spouse name: Not on file  Number of children: Not on file   Years of education: Not on file   Highest education level: Not on file  Occupational History   Not on file  Tobacco Use   Smoking status: Never   Smokeless tobacco: Never  Vaping Use   Vaping Use: Never used  Substance and Sexual Activity   Alcohol use: Yes    Comment: social    Drug use: Never   Sexual activity: Not on file  Other Topics Concern   Not on file  Social History Narrative   Not on file   Social Determinants of Health   Financial Resource Strain: Not on file  Food  Insecurity: Not on file  Transportation Needs: Not on file  Physical Activity: Not on file  Stress: Not on file  Social Connections: Not on file     Family History: The patient's family history includes Cancer in his mother; Heart attack in his brother; Pancreatic cancer in his father. There is no history of Colon cancer, Colon polyps, Esophageal cancer, Stomach cancer, or Rectal cancer.    ROS:   Please see the history of present illness.    All other systems reviewed and are negative.   EKGs/Labs/Other Studies Reviewed:    The following studies were reviewed today: Coronary CTA 06/02/2022 IMPRESSION: 1. Coronary calcium score of 1659. This was 35 percentile for age and sex matched control.   2. Normal coronary origin with right dominance.   3. Distal Left main 50% stenosis.   4. Severe proximal LAD stenosis 70-99%. FFR 0.75 post lesion, 0.67 distal. Abnormal flow.   5. Moderate RCA and LCX stenosis 50-69%.  (FFR negative)   CAD-RADS 4 Severe stenosis. (70-99% or > 50% left main). Cardiac catheterization is recommended. Consider symptom-guided anti-ischemic pharmacotherapy as well as risk factor modification per guideline directed care.   FFR  1.  Abnormal FFR LAD suggestive of flow limiting CAD   2.  Recommend cardiac catheterization.   Echocardiogram 06/03/2022  1. Left ventricular ejection fraction, by estimation, is 60 to 65%. The  left ventricle has normal function. The left ventricle has no regional  wall motion abnormalities. Left ventricular diastolic parameters are  consistent with Grade I diastolic  dysfunction (impaired relaxation).   2. Right ventricular systolic function is normal. The right ventricular  size is normal. There is normal pulmonary artery systolic pressure. The  estimated right ventricular systolic pressure is 01.7 mmHg.   3. The mitral valve is grossly normal. Trivial mitral valve  regurgitation. No evidence of mitral stenosis.   4. The  aortic valve is tricuspid. Aortic valve regurgitation is not  visualized. No aortic stenosis is present.   5. The inferior vena cava is normal in size with greater than 50%  respiratory variability, suggesting right atrial pressure of 3 mmHg.    Left Heart Catheterization 06/05/2022 CONCLUSIONS: 85% heavily calcified proximal to mid LAD stenosis reduced to less than 10% with TIMI grade III flow using a 34 x 3.0 Onyx postdilated to 3.25 at high pressure.  Plaque modification using intravascular lithotripsy 3.5 x 10 device with 10 treatments applied.  Slow flow in a diffusely diseased first diagonal produced ongoing discomfort at the end of the procedure better resolved by the time the patient left the Cath Lab. 35 to 45% distal left main Arteries otherwise have diffuse luminal irregularities but no focal high-grade obstruction. Overall normal LV function with EDP 14 mmHg.   RECOMMENDATIONS:   IV nitroglycerin until chest discomfort resolves.  Flow in the diagonal is improving at the time of procedure completion.  Patient had minimal symptoms at discharge from the Cath Lab.  EKG showed no ischemic changes. Aggressive risk factor modification. Dual antiplatelet therapy for at least 12 months.  Consider de-escalation of ticagrelor to Plavix at 6 months in addition to aspirin.  At the end of 12 months, consider clopidogrel monotherapy.  Diagnostic Dominance: Right  Intervention    _____________    EKG:  EKG is not  ordered today.  Recent Labs: 06/02/2022: ALT 19; TSH 1.529 06/06/2022: BUN 9; Creatinine, Ser 1.06; Hemoglobin 14.2; Platelets 169; Potassium 3.9; Sodium 139  Recent Lipid Panel    Component Value Date/Time   CHOL 171 06/03/2022 0254   TRIG 74 06/03/2022 0254   HDL 53 06/03/2022 0254   CHOLHDL 3.2 06/03/2022 0254   VLDL 15 06/03/2022 0254   LDLCALC 103 (H) 06/03/2022 0254    Physical Exam:    VS:  BP 90/60   Pulse 73   Ht '5\' 11"'$  (1.803 m)   Wt 170 lb (77.1 kg)   SpO2  97%   BMI 23.71 kg/m     Wt Readings from Last 3 Encounters:  06/27/22 170 lb (77.1 kg)  06/13/22 166 lb 9.6 oz (75.6 kg)  06/05/22 166 lb 8 oz (75.5 kg)     GEN:  Well nourished, well developed in no acute distress HEENT: Normal NECK: No JVD; No carotid bruits LYMPHATICS: No lymphadenopathy CARDIAC: RRR, no murmurs, rubs, gallops RESPIRATORY:  Clear to auscultation without rales, wheezing or rhonchi  ABDOMEN: Soft, non-tender, non-distended MUSCULOSKELETAL:  No edema; No deformity  SKIN: Warm and dry NEUROLOGIC:  Alert and oriented x 3 PSYCHIATRIC:  Normal affect   ASSESSMENT AND PLAN:    Fatigue/hypotension  -I do not think his symptoms are anginal.  He had left-sided chest pain radiating between shoulder blade prior to his stenting.  No similar symptoms since then.  His current issue is headache and lack of energy since on Imdur.  He used to play tennis which he likes to go back. -I think his fatigue is due to soft blood pressure.  It used to run 110s over 70. -I have advised adequate hydration.  He will start Imdur.  Then he starts walking.  If does well, he will go back to playing tennis.  If symptoms concerning for angina, like prior to stenting he will give [place] a call back.  2.  CAD -Continue aspirin, Brilinta and statin  3.  Hyperlipidemia -Continue statin 06/03/2022: Cholesterol 171; HDL 53; LDL Cholesterol 103; Triglycerides 74; VLDL 15  -Lipid panel and LFTs at follow-up  4. AKI - Resolved   Medication Adjustments/Labs and Tests Ordered: Current medicines are reviewed at length with the patient today.  Concerns regarding medicines are outlined above.  No orders of the defined types were placed in this encounter.  No orders of the defined types were placed in this encounter.   Patient Instructions  Medication Instructions:  DISCONTINUE Imdur  *If you need a refill on your cardiac medications before your next appointment, please call your pharmacy*   Lab  Work: None Ordered   Testing/Procedures: None Ordered   Follow-Up: At [facility], you and your health needs are our priority.  As part of our continuing mission to provide you with exceptional heart care, we have created designated Provider Care Teams.  These Care Teams include your primary Cardiologist (physician) and Advanced Practice Providers (APPs -  Physician Assistants  and Nurse Practitioners) who all work together to provide you with the care you need, when you need it.  We recommend signing up for the patient portal called "MyChart".  Sign up information is provided on this After Visit Summary.  MyChart is used to connect with patients for Virtual Visits (Telemedicine).  Patients are able to view lab/test results, encounter notes, upcoming appointments, etc.  Non-urgent messages can be sent to your provider as well.   To learn more about what you can do with MyChart, go to [email or web address].    Your next appointment:   4 week(s)  The format for your next appointment:   In Person  Provider:   Freada Bergeron, MD  or APP   Other Instructions   Important Information About Sugar         Jarrett Soho, [place]  06/27/2022 9:11 AM    [place]

## 2022-06-27 NOTE — Patient Instructions (Signed)
Medication Instructions:  DISCONTINUE Imdur  *If you need a refill on your cardiac medications before your next appointment, please call your pharmacy*   Lab Work: None Ordered   Testing/Procedures: None Ordered   Follow-Up: At [facility], you and your health needs are our priority.  As part of our continuing mission to provide you with exceptional heart care, we have created designated Provider Care Teams.  These Care Teams include your primary Cardiologist (physician) and Advanced Practice Providers (APPs -  Physician Assistants and Nurse Practitioners) who all work together to provide you with the care you need, when you need it.  We recommend signing up for the patient portal called "MyChart".  Sign up information is provided on this After Visit Summary.  MyChart is used to connect with patients for Virtual Visits (Telemedicine).  Patients are able to view lab/test results, encounter notes, upcoming appointments, etc.  Non-urgent messages can be sent to your provider as well.   To learn more about what you can do with MyChart, go to [email or web address].    Your next appointment:   4 week(s)  The format for your next appointment:   In Person  Provider:   Freada Bergeron, MD  or APP   Other Instructions   Important Information About Sugar

## 2022-07-05 ENCOUNTER — Other Ambulatory Visit (HOSPITAL_COMMUNITY): Payer: Self-pay

## 2022-07-05 ENCOUNTER — Encounter (HOSPITAL_COMMUNITY): Payer: Self-pay

## 2022-07-24 ENCOUNTER — Other Ambulatory Visit: Payer: PPO

## 2022-07-24 NOTE — Progress Notes (Unsigned)
Office Visit    Patient Name: Mark Fitzgerald Date of Encounter: 07/26/2022  Primary Care Provider:  Burnard Bunting, MD Primary Cardiologist:  Freada Bergeron, MD Primary Electrophysiologist: None  Chief Complaint    Mark Fitzgerald is a 66 y.o. male with PMH of CAD s/p LHC 05/2022 treated with DES x1 and IVL/shockwave to proximal LAD and jailed first diagonal, HLD, mild renal insufficiency who presents today for follow-up of coronary artery disease.  Past Medical History    Past Medical History:  Diagnosis Date   CAD (coronary artery disease)    s/p LAD stent 05/2022   Healthy adult on routine physical examination    Hyperlipidemia LDL goal <70    Past Surgical History:  Procedure Laterality Date   BACK SURGERY     bicep surgery Right    shoulder   COLONOSCOPY     CORONARY LITHOTRIPSY N/A 06/05/2022   Procedure: CORONARY LITHOTRIPSY;  Surgeon: Belva Crome, MD;  Location: [place] [facility];  Service: Cardiovascular;  Laterality: N/A;   CORONARY STENT INTERVENTION N/A 06/05/2022   Procedure: CORONARY STENT INTERVENTION;  Surgeon: Belva Crome, MD;  Location: [place] [facility];  Service: Cardiovascular;  Laterality: N/A;   INTRAVASCULAR ULTRASOUND/IVUS N/A 06/05/2022   Procedure: Intravascular Ultrasound/IVUS;  Surgeon: Belva Crome, MD;  Location: [place] [facility];  Service: Cardiovascular;  Laterality: N/A;   LEFT HEART CATH AND CORONARY ANGIOGRAPHY N/A 06/05/2022   Procedure: LEFT HEART CATH AND CORONARY ANGIOGRAPHY;  Surgeon: Belva Crome, MD;  Location: [place] [facility];  Service: Cardiovascular;  Laterality: N/A;   WRIST SURGERY Right     Allergies  No Known Allergies  History of Present Illness    Mark Fitzgerald  is a 66 year old male with the above mention past medical history who presents today for follow-up of coronary artery disease.  He was initially seen by Dr. Johney Frame in 05/2022 for complaint of chest pain and abnormal cardiac CT.  Patient  noted a transient episode of chest pain while at the gym that lasted 10 seconds and went away without any intervention.  Patient presented to the ED on 06/02/2022 and ACS was ruled out with reassuring initial EKG and troponins.  Patient had a cardiac CTA completed that revealed 70-99% proximal LAD stenosis which was positive via FFR.  LHC was performed by Dr. Tamala Julian that revealed heavily calcified mid LAD stenosis that was treated with DES x1 and IV/shockwave.  He was started on dual antiplatelet therapy with Brilinta and aspirin with plan to transition to Plavix and ASA at 6 months. 2D echo was completed that showed EF of 60 to 65%, no RWMA, grade 1 DD, normal RV systolic function, with normal valvular function.  Mark Fitzgerald was seen in follow-up by Finis Bud, NP following his intervention.  During his visit patient had vague chest tenderness with 2 out of 10 in intensity.  He was overall doing well and tolerating medications.  He was started on Imdur 15 mg for management of chest discomfort.  He was seen 2 weeks later in follow-up with no complaints of chest discomfort.  Blood pressures were noted to be soft during follow-up.  He was advised to increase hydration and continue Imdur.  Mark Fitzgerald presents today alone for follow-up.  Since last being seen in the office patient reports he has been doing much better and has no recurrent fatigue or headaches.  He denies any chest discomfort while  discontinuing his Imdur.  He is staying quite active with pickleball, tennis and exercising at the gym.  He denies any chest discomfort or shortness of breath with these activities.  He is tolerating his Brilinta and other medications without any adverse reactions.  We discussed the possibility of switching from Brilinta to Plavix at 6 months per Dr. Thompson Caul note.  We also further discussed cardiac rehab during our visit and patient is interested in joining cardiac rehab at this time.  Patient denies chest pain,  palpitations, dyspnea, PND, orthopnea, nausea, vomiting, dizziness, syncope, edema, weight gain, or early satiety.   Home Medications    Current Outpatient Medications  Medication Sig Dispense Refill   aspirin EC 81 MG tablet Take 1 tablet (81 mg total) by mouth daily. Swallow whole. 90 tablet 3   Multiple Vitamins-Minerals (MULTIVITAMIN WITH MINERALS) tablet Take 1 tablet by mouth daily.     nitroGLYCERIN (NITROSTAT) 0.4 MG SL tablet Place 1 tablet (0.4 mg total) under the tongue every 5 (five) minutes as needed for chest pain. For up to 3 doses 25 tablet 12   rosuvastatin (CRESTOR) 40 MG tablet Take 1 tablet (40 mg total) by mouth daily. 90 tablet 3   tamsulosin (FLOMAX) 0.4 MG CAPS capsule Take 0.4 mg by mouth daily.     ticagrelor (BRILINTA) 90 MG TABS tablet Take 1 tablet (90 mg total) by mouth 2 (two) times daily. 180 tablet 3   No current facility-administered medications for this visit.     Review of Systems  Please see the history of present illness.     All other systems reviewed and are otherwise negative except as noted above.  Physical Exam    Wt Readings from Last 3 Encounters:  07/26/22 167 lb 6.4 oz (75.9 kg)  06/27/22 170 lb (77.1 kg)  06/13/22 166 lb 9.6 oz (75.6 kg)   VS: Vitals:   07/26/22 0907  BP: 112/66  Pulse: 65  SpO2: 99%  ,Body mass index is 23.35 kg/m.  Constitutional:      Appearance: Healthy appearance. Not in distress.  Neck:     Vascular: JVD normal.  Pulmonary:     Effort: Pulmonary effort is normal.     Breath sounds: No wheezing. No rales. Diminished in the bases Cardiovascular:     Normal rate. Regular rhythm. Normal S1. Normal S2.      Murmurs: There is no murmur.  Edema:    Peripheral edema absent.  Abdominal:     Palpations: Abdomen is soft non tender. There is no hepatomegaly.  Skin:    General: Skin is warm and dry.  Neurological:     General: No focal deficit present.     Mental Status: Alert and oriented to person,  place and time.     Cranial Nerves: Cranial nerves are intact.  EKG/LABS/Other Studies Reviewed    ECG personally reviewed by me today -   none completed today   Lab Results  Component Value Date   WBC 6.3 06/06/2022   HGB 14.2 06/06/2022   HCT 41.4 06/06/2022   MCV 96.7 06/06/2022   PLT 169 06/06/2022   Lab Results  Component Value Date   CREATININE 1.06 06/06/2022   BUN 9 06/06/2022   NA 139 06/06/2022   K 3.9 06/06/2022   CL 110 06/06/2022   CO2 21 (L) 06/06/2022   Lab Results  Component Value Date   ALT 19 06/02/2022   AST 25 06/02/2022   ALKPHOS 40 06/02/2022  BILITOT 0.9 06/02/2022   Lab Results  Component Value Date   CHOL 171 06/03/2022   HDL 53 06/03/2022   LDLCALC 103 (H) 06/03/2022   TRIG 74 06/03/2022   CHOLHDL 3.2 06/03/2022    No results found for: "HGBA1C"  Assessment & Plan    1.  Coronary artery disease: -s/p PCI with DES/shockwave to mid LAD with jailed first diagonal. -Patient noted to have 2 out of 10 chest discomfort during follow-up in August and was started on Imdur 15 mg -Today patient reports no recurrent fatigue or headaches since discontinuing Imdur -Continue GDMT with ASA 81 mg, Brilinta 90 mg twice daily, Crestor 40 mg daily -Patient is ready to start cardiac rehab and referral will be sent today  2.  Hyperlipidemia: -Lipids and LFTs scheduled to be drawn today -For now continue Crestor 40 mg daily  3.  Hypotension due to drug: -Currently resolved -Blood pressures today were 112/66  4.  Renal insufficiency: -Patient's last creatinine was 1.06 in 05/2022    Cardiac Rehabilitation Eligibility Assessment  The patient is ready to start cardiac rehabilitation from a cardiac standpoint.     Disposition: Follow-up with Freada Bergeron, MD or APP in 6 months   Medication Adjustments/Labs and Tests Ordered: Current medicines are reviewed at length with the patient today.  Concerns regarding medicines are outlined above.    Signed, Mable Fill, Marissa Nestle, NP 07/26/2022, 9:33 AM [facility]  Note:  This document was prepared using Dragon voice recognition software and may include unintentional dictation errors.

## 2022-07-26 ENCOUNTER — Ambulatory Visit: Payer: PPO | Attending: Nurse Practitioner | Admitting: Nurse Practitioner

## 2022-07-26 ENCOUNTER — Encounter: Payer: Self-pay | Admitting: Nurse Practitioner

## 2022-07-26 ENCOUNTER — Telehealth (HOSPITAL_COMMUNITY): Payer: Self-pay

## 2022-07-26 ENCOUNTER — Ambulatory Visit: Payer: PPO

## 2022-07-26 VITALS — BP 112/66 | HR 65 | Ht 71.0 in | Wt 167.4 lb

## 2022-07-26 DIAGNOSIS — I2510 Atherosclerotic heart disease of native coronary artery without angina pectoris: Secondary | ICD-10-CM

## 2022-07-26 DIAGNOSIS — Z955 Presence of coronary angioplasty implant and graft: Secondary | ICD-10-CM

## 2022-07-26 DIAGNOSIS — I952 Hypotension due to drugs: Secondary | ICD-10-CM | POA: Diagnosis not present

## 2022-07-26 DIAGNOSIS — E785 Hyperlipidemia, unspecified: Secondary | ICD-10-CM

## 2022-07-26 LAB — LIPID PANEL
Chol/HDL Ratio: 2.1 ratio (ref 0.0–5.0)
Cholesterol, Total: 116 mg/dL (ref 100–199)
HDL: 56 mg/dL (ref >39)
LDL Chol Calc (NIH): 46 mg/dL (ref 0–99)
Triglycerides: 66 mg/dL (ref 0–149)
VLDL Cholesterol Cal: 14 mg/dL (ref 5–40)

## 2022-07-26 LAB — HEPATIC FUNCTION PANEL
ALT: 33 IU/L (ref 0–44)
AST: 32 IU/L (ref 0–40)
Albumin: 4.6 g/dL (ref 3.9–4.9)
Alkaline Phosphatase: 62 IU/L (ref 44–121)
Bilirubin Total: 0.6 mg/dL (ref 0.0–1.2)
Bilirubin, Direct: 0.22 mg/dL (ref 0.00–0.40)
Total Protein: 6.8 g/dL (ref 6.0–8.5)

## 2022-07-26 NOTE — Patient Instructions (Addendum)
Medication Instructions:   *If you need a refill on your cardiac medications before your next appointment, please call your pharmacy*   Lab Work: Lipid and hepatic previously ordered  If you have labs (blood work) drawn today and your tests are completely normal, you will receive your results only by: [place] (if you have MyChart) OR A paper copy in the mail If you have any lab test that is abnormal or we need to change your treatment, we will call you to review the results.   Testing/Procedures:    Follow-Up: At [facility], you and your health needs are our priority.  As part of our continuing mission to provide you with exceptional heart care, we have created designated Provider Care Teams.  These Care Teams include your primary Cardiologist (physician) and Advanced Practice Providers (APPs -  Physician Assistants and Nurse Practitioners) who all work together to provide you with the care you need, when you need it.  We recommend signing up for the patient portal called "MyChart".  Sign up information is provided on this After Visit Summary.  MyChart is used to connect with patients for Virtual Visits (Telemedicine).  Patients are able to view lab/test results, encounter notes, upcoming appointments, etc.  Non-urgent messages can be sent to your provider as well.   To learn more about what you can do with MyChart, go to [email or web address].    Your next appointment:   6 month(s)  The format for your next appointment:   In Person  Provider:   Ambrose Pancoast, NP         Other Instructions Cardiac Rehab    Important Information About Sugar

## 2022-07-26 NOTE — Telephone Encounter (Signed)
No response from pt regarding CR.  Closed referral.  

## 2022-08-07 ENCOUNTER — Other Ambulatory Visit (HOSPITAL_COMMUNITY): Payer: Self-pay

## 2022-08-10 ENCOUNTER — Emergency Department (HOSPITAL_COMMUNITY)
Admission: EM | Admit: 2022-08-10 | Discharge: 2022-08-10 | Disposition: A | Discharge status: Home / Self Care | Payer: PPO | Attending: Emergency Medicine | Admitting: Emergency Medicine

## 2022-08-10 ENCOUNTER — Emergency Department (HOSPITAL_COMMUNITY): Payer: PPO

## 2022-08-10 ENCOUNTER — Other Ambulatory Visit: Payer: Self-pay

## 2022-08-10 ENCOUNTER — Telehealth: Payer: Self-pay | Admitting: Cardiology

## 2022-08-10 DIAGNOSIS — Z7982 Long term (current) use of aspirin: Secondary | ICD-10-CM | POA: Diagnosis not present

## 2022-08-10 DIAGNOSIS — Z79899 Other long term (current) drug therapy: Secondary | ICD-10-CM | POA: Diagnosis not present

## 2022-08-10 DIAGNOSIS — R079 Chest pain, unspecified: Secondary | ICD-10-CM | POA: Diagnosis not present

## 2022-08-10 DIAGNOSIS — R072 Precordial pain: Secondary | ICD-10-CM

## 2022-08-10 DIAGNOSIS — R209 Unspecified disturbances of skin sensation: Secondary | ICD-10-CM | POA: Diagnosis not present

## 2022-08-10 DIAGNOSIS — Z955 Presence of coronary angioplasty implant and graft: Secondary | ICD-10-CM

## 2022-08-10 DIAGNOSIS — I2510 Atherosclerotic heart disease of native coronary artery without angina pectoris: Secondary | ICD-10-CM | POA: Insufficient documentation

## 2022-08-10 DIAGNOSIS — R0789 Other chest pain: Secondary | ICD-10-CM | POA: Insufficient documentation

## 2022-08-10 LAB — BASIC METABOLIC PANEL
Anion gap: 10 (ref 5–15)
BUN: 14 mg/dL (ref 8–23)
CO2: 25 mmol/L (ref 22–32)
Calcium: 9.6 mg/dL (ref 8.9–10.3)
Chloride: 105 mmol/L (ref 98–111)
Creatinine, Ser: 1.14 mg/dL (ref 0.61–1.24)
GFR, Estimated: >60 mL/min (ref >60)
Glucose, Bld: 99 mg/dL (ref 70–99)
Potassium: 4.1 mmol/L (ref 3.5–5.1)
Sodium: 140 mmol/L (ref 135–145)

## 2022-08-10 LAB — TROPONIN I (HIGH SENSITIVITY)
Troponin I (High Sensitivity): 4 ng/L (ref <18)
Troponin I (High Sensitivity): 4 ng/L (ref <18)

## 2022-08-10 LAB — CBC
HCT: 42.8 % (ref 39.0–52.0)
Hemoglobin: 14.3 g/dL (ref 13.0–17.0)
MCH: 32.8 pg (ref 26.0–34.0)
MCHC: 33.4 g/dL (ref 30.0–36.0)
MCV: 98.2 fL (ref 80.0–100.0)
Platelets: 211 10*3/uL (ref 150–400)
RBC: 4.36 MIL/uL (ref 4.22–5.81)
RDW: 12.9 % (ref 11.5–15.5)
WBC: 5.6 10*3/uL (ref 4.0–10.5)
nRBC: 0 % (ref 0.0–0.2)

## 2022-08-10 NOTE — Discharge Instructions (Signed)
We evaluated you in the emergency department for your chest pain.  Your EKG and cardiac enzymes were reassuring.  We did not see signs of any dangerous process.  We discussed your results with the cardiologist who agrees that it should be safe to go home and follow-up.  Please follow-up as scheduled with cardiology on October 24.  Please return to the emergency department if you develop any new or worsening symptoms such as severe chest pain, fainting, nausea or vomiting, difficulty breathing, or any other worsening or worrisome symptoms.

## 2022-08-10 NOTE — ED Provider Triage Note (Signed)
Emergency Medicine Provider Triage Evaluation Note  Mark Fitzgerald , a 66 y.o. male  was evaluated in triage.  Pt complains of chest pain starting yesterday afternoon. Pt works in [employer] and was up walking around doing some light work when the pain started. Hx of stent in August, states discomfort feels similar. Feels like pressure in the left side of his chest with some left arm tingling. Used a nitroglycerin patch and states chest pain has improved, but he now has a headache. Endorses increased life stress recently. No hx blood clots.  Called cardiologist office this morning and recommended going to ER, follows with Dr Johney Frame.   Review of Systems  Positive: CP, headache, left arm tingling Negative: SOB, leg pain or swelling  Physical Exam  BP 113/77   Pulse 67   Temp 98.1 F (36.7 C) (Oral)   Resp 16   Ht '5\' 11"'$  (1.803 m)   Wt 74.8 kg   SpO2 99%   BMI 23.01 kg/m  Gen:   Awake, no distress   Resp:  Normal effort  MSK:   Moves extremities without difficulty  Other:    Medical Decision Making  Medically screening exam initiated at 9:11 AM.  Appropriate orders placed.  Mark Fitzgerald was informed that the remainder of the evaluation will be completed by another provider, this initial triage assessment does not replace that evaluation, and the importance of remaining in the ED until their evaluation is complete.  Workup initiated   Kateri Plummer, PA-C 08/10/22 1610

## 2022-08-10 NOTE — ED Notes (Signed)
Patient verbalizes understanding of d/c instructions. Opportunities for questions and answers were provided. Pt d/c from ED and ambulated to lobby.  

## 2022-08-10 NOTE — Consult Note (Signed)
Cardiology Consultation   Patient ID: MAXIMUM Mark Fitzgerald MRN: [id number]; DOB: May 08, 1956  Admit date: 08/10/2022 Date of Consult: 08/10/2022  PCP:  Burnard Bunting, MD   [facility] Providers Cardiologist:  Freada Bergeron, MD        Patient Profile:   Mark Fitzgerald is a 66 y.o. male with a hx of CAD s/p LAD-DES 06/05/2022 who is being seen 08/10/2022 for the evaluation of chest discomfort at the request of Dr. Truett Mainland.  History of Present Illness:   Mr. Black had some chest pain today which occurred at rest and was vaguely reminiscent of his previous anginal discomfort, but did not have the typical radiation to the interscapular area that he had before.  It did associate some tingling in his left fingers which she associates with the initial presentation.  Was no clear pattern of association or worsening with physical activity.  He took nitroglycerin and thought that it caused a little bit of improvement, but this took about 15 or 20 minutes so it may not have been related to the medication.  He has been compliant with dual antiplatelet therapy.  He exercises regularly and has not noticed any pattern of chest discomfort during exercise.  He denies shortness of breath, nausea, dizziness, palpitations or other cardiovascular complaints  ECG performed while he was still having chest discomfort is completely normal.  He still has some very vague discomfort in his chest.  Symptoms began yesterday afternoon and the initial high-sensitivity troponin obtained today is normal.  The second assay is pending.  Note that at the time of his cardiac catheterization he was found to have a very heavily calcified proximal LAD stenosis that required intravascular lithotripsy followed by placement of a stent.  There was slow flow and a diffusely diseased first diagonal artery that led to ongoing chest discomfort.  He was kept on intravenous nitroglycerin for a while at the time because of chest  pain.   Past Medical History:  Diagnosis Date   CAD (coronary artery disease)    s/p LAD stent 05/2022   Healthy adult on routine physical examination    Hyperlipidemia LDL goal <70     Past Surgical History:  Procedure Laterality Date   BACK SURGERY     bicep surgery Right    shoulder   COLONOSCOPY     CORONARY LITHOTRIPSY N/A 06/05/2022   Procedure: CORONARY LITHOTRIPSY;  Surgeon: Belva Crome, MD;  Location: [facility];  Service: Cardiovascular;  Laterality: N/A;   CORONARY STENT INTERVENTION N/A 06/05/2022   Procedure: CORONARY STENT INTERVENTION;  Surgeon: Belva Crome, MD;  Location: [facility];  Service: Cardiovascular;  Laterality: N/A;   INTRAVASCULAR ULTRASOUND/IVUS N/A 06/05/2022   Procedure: Intravascular Ultrasound/IVUS;  Surgeon: Belva Crome, MD;  Location: [facility];  Service: Cardiovascular;  Laterality: N/A;   LEFT HEART CATH AND CORONARY ANGIOGRAPHY N/A 06/05/2022   Procedure: LEFT HEART CATH AND CORONARY ANGIOGRAPHY;  Surgeon: Belva Crome, MD;  Location: [facility];  Service: Cardiovascular;  Laterality: N/A;   WRIST SURGERY Right        Inpatient Medications: Scheduled Meds:  Continuous Infusions:  PRN Meds:   Allergies:   No Known Allergies  Social History:   Social History   Socioeconomic History   Marital status: Married    Spouse name: Not on file   Number of children: Not on file   Years of education: Not on file  Highest education level: Not on file  Occupational History   Not on file  Tobacco Use   Smoking status: Never   Smokeless tobacco: Never  Vaping Use   Vaping Use: Never used  Substance and Sexual Activity   Alcohol use: Yes    Comment: social    Drug use: Never   Sexual activity: Not on file  Other Topics Concern   Not on file  Social History Narrative   Not on file   Social Determinants of Health   Financial Resource Strain: Not on file  Food Insecurity: Not on file   Transportation Needs: Not on file  Physical Activity: Not on file  Stress: Not on file  Social Connections: Not on file  Intimate Partner Violence: Not on file    Family History:    Family History  Problem Relation Age of Onset   Cancer Mother        pancreatic vs esophageal cancer    Pancreatic cancer Father    Heart attack Brother    Colon cancer Neg Hx    Colon polyps Neg Hx    Esophageal cancer Neg Hx    Stomach cancer Neg Hx    Rectal cancer Neg Hx      ROS:  Please see the history of present illness.   All other ROS reviewed and negative.     Physical Exam/Data:   Vitals:   08/10/22 1120 08/10/22 1627 08/10/22 1647 08/10/22 1730  BP: 103/70 121/82 126/82 120/84  Pulse: 63 70 (!) 59 (!) 56  Resp: '15 14 13 12  '$ Temp: 97.8 F (36.6 C) 98.4 F (36.9 C) 98.6 F (37 C)   TempSrc: Oral  Oral   SpO2: 100% 100% 100% 99%  Weight:      Height:       No intake or output data in the 24 hours ending 08/10/22 1804    08/10/2022    9:04 AM 07/26/2022    9:07 AM 06/27/2022    8:06 AM  Last 3 Weights  Weight (lbs) 165 lb 167 lb 6.4 oz 170 lb  Weight (kg) 74.844 kg 75.932 kg 77.111 kg     Body mass index is 23.01 kg/m.  General:  Well nourished, well developed, in no acute distress, appears very fit, younger than stated age 29: normal Neck: no JVD Vascular: No carotid bruits; Distal pulses 2+ bilaterally Cardiac:  normal S1, S2; RRR; no murmur  Lungs:  clear to auscultation bilaterally, no wheezing, rhonchi or rales  Abd: soft, nontender, no hepatomegaly  Ext: no edema Musculoskeletal:  No deformities, BUE and BLE strength normal and equal Skin: warm and dry  Neuro:  CNs 2-12 intact, no focal abnormalities noted Psych:  Normal affect   EKG:  The EKG was personally reviewed and demonstrates: Sinus rhythm, normal tracing Telemetry:  Telemetry was personally reviewed and demonstrates: Normal sinus rhythm  Relevant CV Studies: Cardiac catheterization  06/05/2022  85% heavily calcified proximal to mid LAD stenosis reduced to less than 10% with TIMI grade III flow using a 34 x 3.0 Onyx postdilated to 3.25 at high pressure.  Plaque modification using intravascular lithotripsy 3.5 x 10 device with 10 treatments applied.  Slow flow in a diffusely diseased first diagonal produced ongoing discomfort at the end of the procedure better resolved by the time the patient left the Cath Lab. 35 to 45% distal left main Arteries otherwise have diffuse luminal irregularities but no focal high-grade obstruction. Overall normal LV function with  EDP 14 mmHg.   RECOMMENDATIONS:   IV nitroglycerin until chest discomfort resolves.  Flow in the diagonal is improving at the time of procedure completion.  Patient had minimal symptoms at discharge from the Cath Lab.  EKG showed no ischemic changes. Aggressive risk factor modification. Dual antiplatelet therapy for at least 12 months.  Consider de-escalation of ticagrelor to Plavix at 6 months in addition to aspirin.  At the end of 12 months, consider clopidogrel monotherapy.   Diagnostic Dominance: Right  Intervention   Implants     Permanent Stent  Stent Onyx Frontier 3.0x34      Echocardiogram 06/03/2022    1. Left ventricular ejection fraction, by estimation, is 60 to 65%. The  left ventricle has normal function. The left ventricle has no regional  wall motion abnormalities. Left ventricular diastolic parameters are  consistent with Grade I diastolic  dysfunction (impaired relaxation).   2. Right ventricular systolic function is normal. The right ventricular  size is normal. There is normal pulmonary artery systolic pressure. The  estimated right ventricular systolic pressure is 18.5 mmHg.   3. The mitral valve is grossly normal. Trivial mitral valve  regurgitation. No evidence of mitral stenosis.   4. The aortic valve is tricuspid. Aortic valve regurgitation is not  visualized. No aortic stenosis  is present.   5. The inferior vena cava is normal in size with greater than 50%  respiratory variability, suggesting right atrial pressure of 3 mmHg.   Laboratory Data:  High Sensitivity Troponin:   Recent Labs  Lab 08/10/22 0911  TROPONINIHS 4     Chemistry Recent Labs  Lab 08/10/22 0911  NA 140  K 4.1  CL 105  CO2 25  GLUCOSE 99  BUN 14  CREATININE 1.14  CALCIUM 9.6  GFRNONAA >60  ANIONGAP 10    No results for input(s): "PROT", "ALBUMIN", "AST", "ALT", "ALKPHOS", "BILITOT" in the last 168 hours. Lipids No results for input(s): "CHOL", "TRIG", "HDL", "LABVLDL", "LDLCALC", "CHOLHDL" in the last 168 hours.  Hematology Recent Labs  Lab 08/10/22 0911  WBC 5.6  RBC 4.36  HGB 14.3  HCT 42.8  MCV 98.2  MCH 32.8  MCHC 33.4  RDW 12.9  PLT 211   Thyroid No results for input(s): "TSH", "FREET4" in the last 168 hours.  BNPNo results for input(s): "BNP", "PROBNP" in the last 168 hours.  DDimer No results for input(s): "DDIMER" in the last 168 hours.   Radiology/Studies:  DG Chest 2 View  Result Date: 08/10/2022 CLINICAL DATA:  Chest pain EXAM: CHEST - 2 VIEW COMPARISON:  None Available. FINDINGS: Normal mediastinum and cardiac silhouette. Normal pulmonary vasculature. No evidence of effusion, infiltrate, or pneumothorax. No acute bony abnormality. IMPRESSION: No acute cardiopulmonary process. Electronically Signed   By: Suzy Bouchard M.D.   On: 08/10/2022 09:39     Assessment and Plan:   CAD status post LAD-DES: It's not clear whether his current complaints are truly representative angina pectoris, but would not be surprising for him to have occasional angina due to jailing of the severely diffuse first diagonal artery.  There are no changes on the ECG to suggest active significant myocardial ischemia and his cardiac enzymes are been normal despite prolonged symptoms beginning almost 24 hours earlier.  There is no reason to suspect stent thrombosis and it is too early  for restenosis. If the second set of cardiac enzymes remains normal I think it safe to continue with outpatient follow-up.  Have made him an appointment  early next week.  Discussed return to emergency room precautions.  Continue dual antiplatelet agents and statin.   Risk Assessment/Risk Scores:                For questions or updates, please contact [facility] Please consult [email or web address] for contact info under    Signed, Sanda Klein, MD  08/10/2022 6:04 PM

## 2022-08-10 NOTE — Telephone Encounter (Signed)
Call came straight to triage. Patient complaining of chest pressure and left arm tingling. Patient had recent heart cath with stent. Had patient take nitro to see if that helped with his chest tightness. Patient state it helped some. Patient has been taking his brilinta as prescribed. Patient stated this all started yesterday and has been continuous. Patient stated he was walking at the time doing his job. Sitting resting did not help, but patient was able to sleep over night. Encouraged patient to go to the ED to get evaluated. Informed patient if he takes another nitro pill he needs to call 911. Will forward to Dr. Johney Frame for further advisement.

## 2022-08-10 NOTE — ED Triage Notes (Signed)
Pt. Stated, I started having chest pain yesterday afternoon with some pressure and have had some left arm tingling. I took a Nitro this am and helped some of pain but not much.

## 2022-08-10 NOTE — ED Notes (Signed)
Patient awake and alert, denies any new concerns or pain, family present at bedside, will continue to monitor.

## 2022-08-10 NOTE — ED Provider Notes (Signed)
[facility] EMERGENCY DEPARTMENT Provider Note  CSN: [id number] Arrival date & time: 08/10/22 9562  Chief Complaint(s) Chest Pain  HPI Mark Fitzgerald is a 66 y.o. male history of hyperlipidemia, coronary artery disease status post recent stenting August 2023 presenting with chest pain.  Patient reports left-sided chest pain, since yesterday.  He reports the pain is very mild, describes as a pressure.  He reports some tingling in his left hand.  It is not exertional, it is not pleuritic.  He denies any associated symptoms such as nausea or vomiting, dyspnea, leg swelling, recent travel, cough, runny nose or sore throat, diaphoresis, syncope or near syncope.  He reports that it does not feel like his prior chest pain that led to his cardiac catheterization.   Past Medical History Past Medical History:  Diagnosis Date   CAD (coronary artery disease)    s/p LAD stent 05/2022   Healthy adult on routine physical examination    Hyperlipidemia LDL goal <70    Patient Active Problem List   Diagnosis Date Noted   HLD (hyperlipidemia) 06/06/2022   CAD (coronary artery disease), native coronary artery 06/06/2022   Precordial chest pain    Unstable angina ([place]) 06/02/2022   Lumbar degenerative disc disease 09/30/2013   Hip weakness 09/30/2013   Muscle weakness of lower extremity 07/21/2013   KNEE PAIN 12/07/2009   TENDINITIS, PATELLAR 12/07/2009   MEDIAL EPICONDYLITIS, RIGHT 09/30/2008   Home Medication(s) Prior to Admission medications   Medication Sig Start Date End Date Taking? Authorizing Provider  aspirin EC 81 MG tablet Take 1 tablet (81 mg total) by mouth daily. Swallow whole. 06/07/22  Yes Margie Billet, PA-C  Multiple Vitamins-Minerals (MULTIVITAMIN WITH MINERALS) tablet Take 1 tablet by mouth daily.   Yes [provider]  nitroGLYCERIN (NITROSTAT) 0.4 MG SL tablet Place 1 tablet (0.4 mg total) under the tongue every 5 (five) minutes as needed for  chest pain. For up to 3 doses 06/06/22  Yes Margie Billet, PA-C  rosuvastatin (CRESTOR) 40 MG tablet Take 1 tablet (40 mg total) by mouth daily. 06/07/22  Yes Margie Billet, PA-C  tamsulosin (FLOMAX) 0.4 MG CAPS capsule Take 0.4 mg by mouth daily. 04/07/22  Yes [provider]  ticagrelor (BRILINTA) 90 MG TABS tablet Take 1 tablet (90 mg total) by mouth 2 (two) times daily. 06/06/22  Yes Margie Billet, PA-C                                                                                                                                    Past Surgical History Past Surgical History:  Procedure Laterality Date   BACK SURGERY     bicep surgery Right    shoulder   COLONOSCOPY     CORONARY LITHOTRIPSY N/A 06/05/2022   Procedure: CORONARY LITHOTRIPSY;  Surgeon: Belva Crome, MD;  Location: [facility];  Service: Cardiovascular;  Laterality: N/A;   CORONARY STENT INTERVENTION N/A 06/05/2022   Procedure: CORONARY STENT INTERVENTION;  Surgeon: Belva Crome, MD;  Location: [facility];  Service: Cardiovascular;  Laterality: N/A;   INTRAVASCULAR ULTRASOUND/IVUS N/A 06/05/2022   Procedure: Intravascular Ultrasound/IVUS;  Surgeon: Belva Crome, MD;  Location: [facility];  Service: Cardiovascular;  Laterality: N/A;   LEFT HEART CATH AND CORONARY ANGIOGRAPHY N/A 06/05/2022   Procedure: LEFT HEART CATH AND CORONARY ANGIOGRAPHY;  Surgeon: Belva Crome, MD;  Location: [facility];  Service: Cardiovascular;  Laterality: N/A;   WRIST SURGERY Right    Family History Family History  Problem Relation Age of Onset   Cancer Mother        pancreatic vs esophageal cancer    Pancreatic cancer Father    Heart attack Brother    Colon cancer Neg Hx    Colon polyps Neg Hx    Esophageal cancer Neg Hx    Stomach cancer Neg Hx    Rectal cancer Neg Hx     Social History Social History   Tobacco Use   Smoking status: Never   Smokeless tobacco: Never  Vaping  Use   Vaping Use: Never used  Substance Use Topics   Alcohol use: Yes    Comment: social    Drug use: Never   Allergies Patient has no known allergies.  Review of Systems Review of Systems  All other systems reviewed and are negative.   Physical Exam Vital Signs  I have reviewed the triage vital signs BP 125/77   Pulse 61   Temp 98.1 F (36.7 C) (Oral)   Resp 15   Ht '5\' 11"'$  (1.803 m)   Wt 74.8 kg   SpO2 96%   BMI 23.01 kg/m  Physical Exam Vitals and nursing note reviewed.  Constitutional:      General: He is not in acute distress.    Appearance: Normal appearance.  HENT:     Mouth/Throat:     Mouth: Mucous membranes are moist.  Eyes:     Conjunctiva/sclera: Conjunctivae normal.  Cardiovascular:     Rate and Rhythm: Normal rate and regular rhythm.  Pulmonary:     Effort: Pulmonary effort is normal. No respiratory distress.     Breath sounds: Normal breath sounds.  Abdominal:     General: Abdomen is flat.     Palpations: Abdomen is soft.     Tenderness: There is no abdominal tenderness.  Musculoskeletal:     Right lower leg: No edema.     Left lower leg: No edema.  Skin:    General: Skin is warm and dry.     Capillary Refill: Capillary refill takes less than 2 seconds.  Neurological:     Mental Status: He is alert and oriented to person, place, and time. Mental status is at baseline.  Psychiatric:        Mood and Affect: Mood normal.        Behavior: Behavior normal.     ED Results and Treatments Labs (all labs ordered are listed, but only abnormal results are displayed) Labs Reviewed  BASIC METABOLIC PANEL  CBC  TROPONIN I (HIGH SENSITIVITY)  TROPONIN I (HIGH SENSITIVITY)  Radiology DG Chest 2 View  Result Date: 08/10/2022 CLINICAL DATA:  Chest pain EXAM: CHEST - 2 VIEW COMPARISON:  None Available. FINDINGS: Normal  mediastinum and cardiac silhouette. Normal pulmonary vasculature. No evidence of effusion, infiltrate, or pneumothorax. No acute bony abnormality. IMPRESSION: No acute cardiopulmonary process. Electronically Signed   By: Suzy Bouchard M.D.   On: 08/10/2022 09:39    Pertinent labs & imaging results that were available during my care of the patient were reviewed by me and considered in my medical decision making (see MDM for details).  Medications Ordered in ED Medications - No data to display                                                                                                                                   Procedures Procedures  (including critical care time)  Medical Decision Making / ED Course   MDM:  66 year old male presenting with chest pain.  Patient very well-appearing, physical exam reassuring.  EKG appears similar to previous and has no acute ST changes concerning for ischemia.  Differential includes musculoskeletal chest pain, ACS, pneumothorax, pneumonia, dissection, esophageal rupture, contusion.  Unclear cause of symptoms, possibly musculoskeletal.  Low concern for ACS, troponin negative x2.  EKG also reassuring.  Doubt dissection, chest x-ray with normal mediastinum, symptoms atypical.  Doubt pneumonia, pneumothorax with reassuring chest x-ray.  Doubt esophageal pathology without nausea or vomiting.  Doubt pulmonary embolism, no pleuritic pain, hypoxia, tachycardia.  Discussed presenting symptoms with cardiology as well as results of testing given patient recently had catheterization, they agree that low concern for ACS at this time, agree with plan of discharge with negative troponins.  He has close cardiology follow-up in 5 days.  Will discharge patient to home. All questions answered. Patient comfortable with plan of discharge. Return precautions discussed with patient and specified on the after visit summary.       Additional history obtained: -Additional  history obtained from spouse -External records from outside source obtained and reviewed including: Chart review including previous notes, labs, imaging, consultation notes including prior catheterization note from August   Lab Tests: -I ordered, reviewed, and interpreted labs.   The pertinent results include:   Labs Reviewed  BASIC METABOLIC PANEL  CBC  TROPONIN I (HIGH SENSITIVITY)  TROPONIN I (HIGH SENSITIVITY)    Notable for negative troponin x2  EKG   EKG Interpretation  Date/Time:  Thursday August 10 2022 09:05:06 EDT Ventricular Rate:  77 PR Interval:  168 QRS Duration: 82 QT Interval:  372 QTC Calculation: 420 R Axis:   76 Text Interpretation: Normal sinus rhythm Normal ECG When compared with ECG of 06-Jun-2022 08:33, PREVIOUS ECG IS PRESENT Confirmed by Garnette Gunner [id number]) on 08/10/2022 4:43:19 PM         Imaging Studies ordered: I ordered imaging studies including CXR On my interpretation imaging demonstrates normal lungs  I independently visualized and interpreted imaging. I  agree with the radiologist interpretation   Medicines ordered and prescription drug management: No orders of the defined types were placed in this encounter.   -I have reviewed the patients home medicines and have made adjustments as needed   Consultations Obtained: I requested consultation with the cardiologist,  and discussed lab and imaging findings as well as pertinent plan - they recommend: outpatient follow up    Cardiac Monitoring: The patient was maintained on a cardiac monitor.  I personally viewed and interpreted the cardiac monitored which showed an underlying rhythm of: NSR   Reevaluation: After the interventions noted above, I reevaluated the patient and found that they have improved  Co morbidities that complicate the patient evaluation  Past Medical History:  Diagnosis Date   CAD (coronary artery disease)    s/p LAD stent 05/2022   Healthy adult on  routine physical examination    Hyperlipidemia LDL goal <70       Dispostion: Disposition decision including need for hospitalization was considered, and patient discharged from emergency department.    Final Clinical Impression(s) / ED Diagnoses Final diagnoses:  Atypical chest pain     This chart was dictated using voice recognition software.  Despite best efforts to proofread,  errors can occur which can change the documentation meaning.    Cristie Hem, MD 08/10/22 [phone]

## 2022-08-10 NOTE — Telephone Encounter (Signed)
Pt c/o of Chest Pain: STAT if CP now or developed within 24 hours  1. Are you having CP right now? Yes   2. Are you experiencing any other symptoms (ex. SOB, nausea, vomiting, sweating)? Left arm tingling a little  3. How long have you been experiencing CP? Yesterday afternoon   4. Is your CP continuous or coming and going? Continuous   5. Have you taken Nitroglycerin? Have medication but have not taken it ?

## 2022-08-15 ENCOUNTER — Ambulatory Visit: Payer: PPO | Attending: Physician Assistant | Admitting: Physician Assistant

## 2022-08-15 ENCOUNTER — Encounter: Payer: Self-pay | Admitting: Physician Assistant

## 2022-08-15 VITALS — BP 104/60 | HR 82 | Ht 71.0 in | Wt 165.0 lb

## 2022-08-15 DIAGNOSIS — E785 Hyperlipidemia, unspecified: Secondary | ICD-10-CM

## 2022-08-15 DIAGNOSIS — I2510 Atherosclerotic heart disease of native coronary artery without angina pectoris: Secondary | ICD-10-CM

## 2022-08-15 MED ORDER — ISOSORBIDE MONONITRATE ER 30 MG PO TB24: 15.0000 mg | ORAL_TABLET | Freq: Every day | ORAL | 1 refills | Status: DC

## 2022-08-15 NOTE — Progress Notes (Signed)
Cardiology Office Note:    Date:  08/15/2022   ID:  Mark Fitzgerald, DOB May 25, 1956, MRN [id number]  PCP:  Burnard Bunting, MD  [facility] Cardiologist:  Freada Bergeron, MD  [facility] Electrophysiologist:  None   Chief Complaint: Hospital follow up   History of Present Illness:    Mark Fitzgerald is a 66 y.o. male with a hx of CAD s/p LAD-DES 06/05/2022 and HLD seen for follow up.   Presented to hospital August 2023 for chest pain.  Coronary CT was abnormal.  Underwent cardiac catheterization showing 40% left main and 85% proximal LAD stenosis. LAD was treated with DES + plaque modification using intravascular lithotripsy x 10 treatments. There was slow flow in diffusely disease D1 which produced ongoing CP at the end of procedure, pain improved with nitro SL.  Plan for dual antiplatelet therapy 12 months.  Placed on high intensity statin.  During follow-up patient continues to have mild chest discomfort.  Imdur added.  Seen in ER 08/10/22 for chest pain which occurred at rest and was vaguely reminiscent of his previous anginal discomfort, but did not have the typical radiation to the interscapular area that he had before. EKG without acute changes. Trop negative.    Patient is here for follow-up.  Since office visit with me September 5th, he has played tennis few days without chest discomfort.  Discontinue Imdur due to fatigue.  Had mild headache as well.  Now reporting left-sided chest discomfort radiating to his arm intermittently.  He took nitroglycerin prior to ER visit with mild improvement on his symptoms.  He reports his symptoms are worse compared to prior to his PCI.  He feels some discomfort on the left side of chest.  He had shoulder pain prior to PCI.  Reports compliance with medication.   Past Medical History:  Diagnosis Date   CAD (coronary artery disease)    s/p LAD stent 05/2022   Healthy adult on routine physical examination    Hyperlipidemia LDL goal <70      Past Surgical History:  Procedure Laterality Date   BACK SURGERY     bicep surgery Right    shoulder   COLONOSCOPY     CORONARY LITHOTRIPSY N/A 06/05/2022   Procedure: CORONARY LITHOTRIPSY;  Surgeon: Belva Crome, MD;  Location: [facility];  Service: Cardiovascular;  Laterality: N/A;   CORONARY STENT INTERVENTION N/A 06/05/2022   Procedure: CORONARY STENT INTERVENTION;  Surgeon: Belva Crome, MD;  Location: [facility];  Service: Cardiovascular;  Laterality: N/A;   INTRAVASCULAR ULTRASOUND/IVUS N/A 06/05/2022   Procedure: Intravascular Ultrasound/IVUS;  Surgeon: Belva Crome, MD;  Location: [facility];  Service: Cardiovascular;  Laterality: N/A;   LEFT HEART CATH AND CORONARY ANGIOGRAPHY N/A 06/05/2022   Procedure: LEFT HEART CATH AND CORONARY ANGIOGRAPHY;  Surgeon: Belva Crome, MD;  Location: [facility];  Service: Cardiovascular;  Laterality: N/A;   WRIST SURGERY Right     Current Medications: Current Meds  Medication Sig   aspirin EC 81 MG tablet Take 1 tablet (81 mg total) by mouth daily. Swallow whole.   isosorbide mononitrate (IMDUR) 30 MG 24 hr tablet Take 0.5 tablets (15 mg total) by mouth daily.   Multiple Vitamins-Minerals (MULTIVITAMIN WITH MINERALS) tablet Take 1 tablet by mouth daily.   nitroGLYCERIN (NITROSTAT) 0.4 MG SL tablet Place 1 tablet (0.4 mg total) under the tongue every 5 (five) minutes as needed for chest pain. For up  to 3 doses   rosuvastatin (CRESTOR) 40 MG tablet Take 1 tablet (40 mg total) by mouth daily.   tamsulosin (FLOMAX) 0.4 MG CAPS capsule Take 0.4 mg by mouth daily.   ticagrelor (BRILINTA) 90 MG TABS tablet Take 1 tablet (90 mg total) by mouth 2 (two) times daily.     Allergies:   Patient has no known allergies.   Social History   Socioeconomic History   Marital status: Married    Spouse name: Not on file   Number of children: Not on file   Years of education: Not on file   Highest education level: Not  on file  Occupational History   Not on file  Tobacco Use   Smoking status: Never   Smokeless tobacco: Never  Vaping Use   Vaping Use: Never used  Substance and Sexual Activity   Alcohol use: Yes    Comment: social    Drug use: Never   Sexual activity: Not on file  Other Topics Concern   Not on file  Social History Narrative   Not on file   Social Determinants of Health   Financial Resource Strain: Not on file  Food Insecurity: Not on file  Transportation Needs: Not on file  Physical Activity: Not on file  Stress: Not on file  Social Connections: Not on file     Family History: The patient's family history includes Cancer in his mother; Heart attack in his brother; Pancreatic cancer in his father. There is no history of Colon cancer, Colon polyps, Esophageal cancer, Stomach cancer, or Rectal cancer.    ROS:   Please see the history of present illness.    All other systems reviewed and are negative.   EKGs/Labs/Other Studies Reviewed:    The following studies were reviewed today:  Cardiac catheterization 06/05/2022   85% heavily calcified proximal to mid LAD stenosis reduced to less than 10% with TIMI grade III flow using a 34 x 3.0 Onyx postdilated to 3.25 at high pressure.  Plaque modification using intravascular lithotripsy 3.5 x 10 device with 10 treatments applied.  Slow flow in a diffusely diseased first diagonal produced ongoing discomfort at the end of the procedure better resolved by the time the patient left the Cath Lab. 35 to 45% distal left main Arteries otherwise have diffuse luminal irregularities but no focal high-grade obstruction. Overall normal LV function with EDP 14 mmHg.   RECOMMENDATIONS:   IV nitroglycerin until chest discomfort resolves.  Flow in the diagonal is improving at the time of procedure completion.  Patient had minimal symptoms at discharge from the Cath Lab.  EKG showed no ischemic changes. Aggressive risk factor modification. Dual  antiplatelet therapy for at least 12 months.  Consider de-escalation of ticagrelor to Plavix at 6 months in addition to aspirin.  At the end of 12 months, consider clopidogrel monotherapy.   Diagnostic Dominance: Right  Intervention       Echocardiogram 06/03/2022     1. Left ventricular ejection fraction, by estimation, is 60 to 65%. The  left ventricle has normal function. The left ventricle has no regional  wall motion abnormalities. Left ventricular diastolic parameters are  consistent with Grade I diastolic  dysfunction (impaired relaxation).   2. Right ventricular systolic function is normal. The right ventricular  size is normal. There is normal pulmonary artery systolic pressure. The  estimated right ventricular systolic pressure is 35.3 mmHg.   3. The mitral valve is grossly normal. Trivial mitral valve  regurgitation. No  evidence of mitral stenosis.   4. The aortic valve is tricuspid. Aortic valve regurgitation is not  visualized. No aortic stenosis is present.   5. The inferior vena cava is normal in size with greater than 50%  respiratory variability, suggesting right atrial pressure of 3 mmHg.      EKG:  EKG is not  ordered today.   Recent Labs: 06/02/2022: TSH 1.529 07/26/2022: ALT 33 08/10/2022: BUN 14; Creatinine, Ser 1.14; Hemoglobin 14.3; Platelets 211; Potassium 4.1; Sodium 140  Recent Lipid Panel    Component Value Date/Time   CHOL 116 07/26/2022 0952   TRIG 66 07/26/2022 0952   HDL 56 07/26/2022 0952   CHOLHDL 2.1 07/26/2022 0952   CHOLHDL 3.2 06/03/2022 0254   VLDL 15 06/03/2022 0254   LDLCALC 46 07/26/2022 0952    Physical Exam:    VS:  BP 104/60   Pulse 82   Ht '5\' 11"'$  (1.803 m)   Wt 165 lb (74.8 kg)   SpO2 97%   BMI 23.01 kg/m     Wt Readings from Last 3 Encounters:  08/15/22 165 lb (74.8 kg)  08/10/22 165 lb (74.8 kg)  07/26/22 167 lb 6.4 oz (75.9 kg)     GEN:  Well nourished, well developed in no acute distress HEENT:  Normal NECK: No JVD; No carotid bruits LYMPHATICS: No lymphadenopathy CARDIAC: RRR, no murmurs, rubs, gallops RESPIRATORY:  Clear to auscultation without rales, wheezing or rhonchi  ABDOMEN: Soft, non-tender, non-distended MUSCULOSKELETAL:  No edema; No deformity  SKIN: Warm and dry NEUROLOGIC:  Alert and oriented x 3 PSYCHIATRIC:  Normal affect   ASSESSMENT AND PLAN:   Chest discomfort  CAD s/p DES to LAD with intravascular lithotripsy. Had jailing of diffuse diagonal artery -Mild chest discomfort with occasional left arm radiation.  However, able to play tennis few weeks ago.  He reports worse symptoms after his PCI.  Discontinued Imdur due to fatigue and mild headache.  Reports compliance with aspirin, Brilinta and Crestor.  Reviewed cath films with Dr. Johney Frame.  Likely his symptoms due to jailed diagonal with microvascular angina.  I do not think PCI would be an option.  We will try at lower dose at 15 mg daily.  Previously on 30 mg daily.  Other option would be a addition of Ranexa.  His blood pressure is soft but he is willing to try low-dose metoprolol or amlodipine in future if needed.  I have spent at least 45 minutes reviewing finding with patient and Dr. Johney Frame.  3. HLD -Continue statin 06/03/2022: VLDL 15 07/26/2022: Cholesterol, Total 116; HDL 56; LDL Chol Calc (NIH) 46; Triglycerides 66   Medication Adjustments/Labs and Tests Ordered: Current medicines are reviewed at length with the patient today.  Concerns regarding medicines are outlined above.  No orders of the defined types were placed in this encounter.  Meds ordered this encounter  Medications   isosorbide mononitrate (IMDUR) 30 MG 24 hr tablet    Sig: Take 0.5 tablets (15 mg total) by mouth daily.    Dispense:  45 tablet    Refill:  1    Patient Instructions  Medication Instructions:  START Imdur '15mg'$  Take 1 tablet once a day (the tablet comes '30mg'$  so you will have to break the tablet in half) *If you  need a refill on your cardiac medications before your next appointment, please call your pharmacy*   Lab Work: None ordered   Testing/Procedures: None Ordered   Follow-Up: At [facility], you  and your health needs are our priority.  As part of our continuing mission to provide you with exceptional heart care, we have created designated Provider Care Teams.  These Care Teams include your primary Cardiologist (physician) and Advanced Practice Providers (APPs -  Physician Assistants and Nurse Practitioners) who all work together to provide you with the care you need, when you need it.  We recommend signing up for the patient portal called "MyChart".  Sign up information is provided on this After Visit Summary.  MyChart is used to connect with patients for Virtual Visits (Telemedicine).  Patients are able to view lab/test results, encounter notes, upcoming appointments, etc.  Non-urgent messages can be sent to your provider as well.   To learn more about what you can do with MyChart, go to [email or web address].    Your next appointment:   4 week(s)  The format for your next appointment:   In Person  Provider:   Freada Bergeron, MD     Other Instructions   Important Information About Sugar         Signed, Leanor Kail, [place]  08/15/2022 3:11 PM    [place]

## 2022-08-15 NOTE — Patient Instructions (Signed)
Medication Instructions:  START Imdur '15mg'$  Take 1 tablet once a day (the tablet comes '30mg'$  so you will have to break the tablet in half) *If you need a refill on your cardiac medications before your next appointment, please call your pharmacy*   Lab Work: None ordered   Testing/Procedures: None Ordered   Follow-Up: At [facility], you and your health needs are our priority.  As part of our continuing mission to provide you with exceptional heart care, we have created designated Provider Care Teams.  These Care Teams include your primary Cardiologist (physician) and Advanced Practice Providers (APPs -  Physician Assistants and Nurse Practitioners) who all work together to provide you with the care you need, when you need it.  We recommend signing up for the patient portal called "MyChart".  Sign up information is provided on this After Visit Summary.  MyChart is used to connect with patients for Virtual Visits (Telemedicine).  Patients are able to view lab/test results, encounter notes, upcoming appointments, etc.  Non-urgent messages can be sent to your provider as well.   To learn more about what you can do with MyChart, go to [email or web address].    Your next appointment:   4 week(s)  The format for your next appointment:   In Person  Provider:   Freada Bergeron, MD     Other Instructions   Important Information About Sugar

## 2022-08-29 ENCOUNTER — Encounter (HOSPITAL_COMMUNITY): Payer: Self-pay

## 2022-08-29 ENCOUNTER — Telehealth (HOSPITAL_COMMUNITY): Payer: Self-pay

## 2022-08-29 NOTE — Telephone Encounter (Signed)
Attempted to call patient in regards to Cardiac Rehab - LM on VM Mailed letter 

## 2022-08-31 ENCOUNTER — Other Ambulatory Visit (HOSPITAL_COMMUNITY): Payer: Self-pay

## 2022-08-31 ENCOUNTER — Telehealth (HOSPITAL_COMMUNITY): Payer: Self-pay

## 2022-08-31 NOTE — Telephone Encounter (Signed)
Pt insurance is active and benefits verified through [employer] adv Co-pay $15, DED 0/0 met, out of pocket $3,200/$195 met, co-insurance 0%. no pre-authorization required, Erica/HTA 08/31/2022_0 :52pm, REF# [id number]   How many CR sessions are covered? (72 sessions for ICR)72 Is this a lifetime maximum or an annual maximum? annual Has the member used any of these services to date? no Is there a time limit (weeks/months) on start of program and/or program completion? no

## 2022-09-01 ENCOUNTER — Telehealth (HOSPITAL_COMMUNITY): Payer: Self-pay | Admitting: *Deleted

## 2022-09-01 NOTE — Telephone Encounter (Signed)
Spoke with Herbie Baltimore. Verified appointment. Completed health history.Harrell Gave RN BSN

## 2022-09-05 ENCOUNTER — Encounter (HOSPITAL_COMMUNITY)
Admission: RE | Admit: 2022-09-05 | Discharge: 2022-09-05 | Disposition: A | Discharge status: Home / Self Care | Payer: PPO | Source: Ambulatory Visit | Attending: Cardiology | Admitting: Cardiology

## 2022-09-05 VITALS — BP 104/70 | HR 68 | Ht 70.5 in | Wt 168.9 lb

## 2022-09-05 DIAGNOSIS — Z955 Presence of coronary angioplasty implant and graft: Secondary | ICD-10-CM | POA: Insufficient documentation

## 2022-09-05 DIAGNOSIS — Z48812 Encounter for surgical aftercare following surgery on the circulatory system: Secondary | ICD-10-CM | POA: Insufficient documentation

## 2022-09-05 NOTE — Progress Notes (Signed)
Cardiac Individual Treatment Plan  Patient Details  Name: KAYLIN SCHELLENBERG MRN: [id number] Date of Birth: January 28, 1956 Referring Provider:   Flowsheet Row INTENSIVE CARDIAC REHAB ORIENT from 09/05/2022 in [facility] [place]  Referring Provider Gwyndolyn Kaufman, MD       Initial Encounter Date:  [place] from 09/05/2022 in [facility]  Date 09/05/22       Visit Diagnosis: 06/05/22 S/P DES Prox/Mid LAD Intravascular Lithotripsy  Patient's Home Medications on Admission:  Current Outpatient Medications:    aspirin EC 81 MG tablet, Take 1 tablet (81 mg total) by mouth daily. Swallow whole., Disp: 90 tablet, Rfl: 3   cyanocobalamin (VITAMIN B12) 500 MCG tablet, Take 500 mcg by mouth daily., Disp: , Rfl:    isosorbide mononitrate (IMDUR) 30 MG 24 hr tablet, Take 0.5 tablets (15 mg total) by mouth daily., Disp: 45 tablet, Rfl: 1   Multiple Vitamins-Minerals (MULTIVITAMIN WITH MINERALS) tablet, Take 1 tablet by mouth daily., Disp: , Rfl:    nitroGLYCERIN (NITROSTAT) 0.4 MG SL tablet, Place 1 tablet (0.4 mg total) under the tongue every 5 (five) minutes as needed for chest pain. For up to 3 doses, Disp: 25 tablet, Rfl: 12   rosuvastatin (CRESTOR) 40 MG tablet, Take 1 tablet (40 mg total) by mouth daily., Disp: 90 tablet, Rfl: 3   tamsulosin (FLOMAX) 0.4 MG CAPS capsule, Take 0.4 mg by mouth daily., Disp: , Rfl:    ticagrelor (BRILINTA) 90 MG TABS tablet, Take 1 tablet (90 mg total) by mouth 2 (two) times daily., Disp: 180 tablet, Rfl: 3  Past Medical History: Past Medical History:  Diagnosis Date   CAD (coronary artery disease)    s/p LAD stent 05/2022   Healthy adult on routine physical examination    Hyperlipidemia LDL goal <70     Tobacco Use: Social History   Tobacco Use  Smoking Status Never  Smokeless Tobacco Never    Labs: Review  Flowsheet       Latest Ref Rng & Units 06/03/2022 07/26/2022  Labs for ITP Cardiac and Pulmonary Rehab  Cholestrol 100 - 199 mg/dL 171  116   LDL (calc) 0 - 99 mg/dL 103  46   HDL-C >39 mg/dL 53  56   Trlycerides 0 - 149 mg/dL 74  66     Capillary Blood Glucose: No results found for: "GLUCAP"   Exercise Target Goals: Exercise Program Goal: Individual exercise prescription set using results from initial 6 min walk test and THRR while considering  patient's activity barriers and safety.   Exercise Prescription Goal: Initial exercise prescription builds to 30-45 minutes a day of aerobic activity, 2-3 days per week.  Home exercise guidelines will be given to patient during program as part of exercise prescription that the participant will acknowledge.  Activity Barriers & Risk Stratification:  Activity Barriers & Cardiac Risk Stratification - 09/05/22 1506       Activity Barriers & Cardiac Risk Stratification   Activity Barriers Back Problems;Neck/Spine Problems;Joint Problems;Chest Pain/Angina;Balance Concerns    Cardiac Risk Stratification High             6 Minute Walk:  6 Minute Walk     Row Name 09/05/22 1153         6 Minute Walk   Phase Initial     Distance 1884 feet     Walk Time 6 minutes     #  of Rest Breaks 0     MPH 3.57     METS 4.18     RPE 7     Perceived Dyspnea  0     VO2 Peak 14.6     Symptoms No     Resting HR 68 bpm     Resting BP 104/70     Resting Oxygen Saturation  98 %     Exercise Oxygen Saturation  during 6 min walk 100 %     Max Ex. HR 87 bpm     Max Ex. BP 128/70     2 Minute Post BP 108/64              Oxygen Initial Assessment:   Oxygen Re-Evaluation:   Oxygen Discharge (Final Oxygen Re-Evaluation):   Initial Exercise Prescription:  Initial Exercise Prescription - 09/05/22 1500       Date of Initial Exercise RX and Referring Provider   Date 09/05/22    Referring Provider Gwyndolyn Kaufman, MD    Expected  Discharge Date 11/03/22      Arm/Foot Ergometer   Level 2    Watts 50    Minutes 15    METs 4.1      Biostep-RELP   Level 2    SPM 60    Minutes 15    METs 4.1      Prescription Details   Frequency (times per week) 3    Duration Progress to 30 minutes of continuous aerobic without signs/symptoms of physical distress      Intensity   THRR 40-80% of Max Heartrate 61-122    Ratings of Perceived Exertion 11-13    Perceived Dyspnea 0-4      Progression   Progression Continue progressive overload as per policy without signs/symptoms or physical distress.      Resistance Training   Training Prescription Yes    Weight 4 lbs    Reps 10-15             Perform Capillary Blood Glucose checks as needed.  Exercise Prescription Changes:   Exercise Comments:   Exercise Goals and Review:   Exercise Goals     Row Name 09/05/22 1507             Exercise Goals   Increase Physical Activity Yes       Intervention Provide advice, education, support and counseling about physical activity/exercise needs.;Develop an individualized exercise prescription for aerobic and resistive training based on initial evaluation findings, risk stratification, comorbidities and participant's personal goals.       Expected Outcomes Short Term: Attend rehab on a regular basis to increase amount of physical activity.;Long Term: Add in home exercise to make exercise part of routine and to increase amount of physical activity.;Long Term: Exercising regularly at least 3-5 days a week.       Increase Strength and Stamina Yes       Intervention Provide advice, education, support and counseling about physical activity/exercise needs.;Develop an individualized exercise prescription for aerobic and resistive training based on initial evaluation findings, risk stratification, comorbidities and participant's personal goals.       Expected Outcomes Short Term: Increase workloads from initial exercise  prescription for resistance, speed, and METs.;Short Term: Perform resistance training exercises routinely during rehab and add in resistance training at home;Long Term: Improve cardiorespiratory fitness, muscular endurance and strength as measured by increased METs and functional capacity (6MWT)       Able to understand and use rate  of perceived exertion (RPE) scale Yes       Intervention Provide education and explanation on how to use RPE scale       Expected Outcomes Short Term: Able to use RPE daily in rehab to express subjective intensity level;Long Term:  Able to use RPE to guide intensity level when exercising independently       Knowledge and understanding of Target Heart Rate Range (THRR) Yes       Intervention Provide education and explanation of THRR including how the numbers were predicted and where they are located for reference       Expected Outcomes Short Term: Able to use daily as guideline for intensity in rehab;Short Term: Able to state/look up THRR;Long Term: Able to use THRR to govern intensity when exercising independently       Understanding of Exercise Prescription Yes       Intervention Provide education, explanation, and written materials on patient's individual exercise prescription       Expected Outcomes Short Term: Able to explain program exercise prescription;Long Term: Able to explain home exercise prescription to exercise independently                Exercise Goals Re-Evaluation :   Discharge Exercise Prescription (Final Exercise Prescription Changes):   Nutrition:  Target Goals: Understanding of nutrition guidelines, daily intake of sodium '1500mg'$ , cholesterol '200mg'$ , calories 30% from fat and 7% or less from saturated fats, daily to have 5 or more servings of fruits and vegetables.  Biometrics:  Pre Biometrics - 09/05/22 1015       Pre Biometrics   Waist Circumference 37.25 inches    Hip Circumference 39.5 inches    Waist to Hip Ratio 0.94 %     Triceps Skinfold 7 mm    % Body Fat 21.6 %    Grip Strength 36 kg    Flexibility 0 in   cannot reach   Single Leg Stand 4 seconds              Nutrition Therapy Plan and Nutrition Goals:   Nutrition Assessments:  MEDIFICTS Score Key: ?70 Need to make dietary changes  40-70 Heart Healthy Diet ? 40 Therapeutic Level Cholesterol Diet    Picture Your Plate Scores: <95 Unhealthy dietary pattern with much room for improvement. 41-50 Dietary pattern unlikely to meet recommendations for good health and room for improvement. 51-60 More healthful dietary pattern, with some room for improvement.  >60 Healthy dietary pattern, although there may be some specific behaviors that could be improved.    Nutrition Goals Re-Evaluation:   Nutrition Goals Re-Evaluation:   Nutrition Goals Discharge (Final Nutrition Goals Re-Evaluation):   Psychosocial: Target Goals: Acknowledge presence or absence of significant depression and/or stress, maximize coping skills, provide positive support system. Participant is able to verbalize types and ability to use techniques and skills needed for reducing stress and depression.  Initial Review & Psychosocial Screening:  Initial Psych Review & Screening - 09/05/22 1510       Initial Review   Current issues with None Identified      Family Dynamics   Good Support System? Yes   Steve has his wife and friends who live in the area for support     Barriers   Psychosocial barriers to participate in program The patient should benefit from training in stress management and relaxation.      Screening Interventions   Interventions Encouraged to exercise  Quality of Life Scores:  Quality of Life - 09/05/22 1523       Quality of Life   Select Quality of Life      Quality of Life Scores   Health/Function Pre 24.8 %    Socioeconomic Pre 28.31 %    Psych/Spiritual Pre 28.29 %    Family Pre 29.5 %    GLOBAL Pre 26.97 %             Scores of 19 and below usually indicate a poorer quality of life in these areas.  A difference of  2-3 points is a clinically meaningful difference.  A difference of 2-3 points in the total score of the Quality of Life Index has been associated with significant improvement in overall quality of life, self-image, physical symptoms, and general health in studies assessing change in quality of life.  PHQ-9: Review Flowsheet       09/05/2022  Depression screen PHQ 2/9  Decreased Interest 0  Down, Depressed, Hopeless 0  PHQ - 2 Score 0   Interpretation of Total Score  Total Score Depression Severity:  1-4 = Minimal depression, 5-9 = Mild depression, 10-14 = Moderate depression, 15-19 = Moderately severe depression, 20-27 = Severe depression   Psychosocial Evaluation and Intervention:   Psychosocial Re-Evaluation:   Psychosocial Discharge (Final Psychosocial Re-Evaluation):   Vocational Rehabilitation: Provide vocational rehab assistance to qualifying candidates.   Vocational Rehab Evaluation & Intervention:  Vocational Rehab - 09/05/22 1510       Initial Vocational Rehab Evaluation & Intervention   Assessment shows need for Vocational Rehabilitation No   Tyrik is currently working and does not need vocatinal rehab at this time.            Education: Education Goals: Education classes will be provided on a weekly basis, covering required topics. Participant will state understanding/return demonstration of topics presented.     Core Videos: Exercise    Move It!  Clinical staff conducted group or individual video education with verbal and written material and guidebook.  Patient learns the recommended Pritikin exercise program. Exercise with the goal of living a long, healthy life. Some of the health benefits of exercise include controlled diabetes, healthier blood pressure levels, improved cholesterol levels, improved heart and lung capacity, improved sleep, and  better body composition. Everyone should speak with their doctor before starting or changing an exercise routine.  Biomechanical Limitations Clinical staff conducted group or individual video education with verbal and written material and guidebook.  Patient learns how biomechanical limitations can impact exercise and how we can mitigate and possibly overcome limitations to have an impactful and balanced exercise routine.  Body Composition Clinical staff conducted group or individual video education with verbal and written material and guidebook.  Patient learns that body composition (ratio of muscle mass to fat mass) is a key component to assessing overall fitness, rather than body weight alone. Increased fat mass, especially visceral belly fat, can put [place] at increased risk for metabolic syndrome, type 2 diabetes, heart disease, and even death. It is recommended to combine diet and exercise (cardiovascular and resistance training) to improve your body composition. Seek guidance from your physician and exercise physiologist before implementing an exercise routine.  Exercise Action Plan Clinical staff conducted group or individual video education with verbal and written material and guidebook.  Patient learns the recommended strategies to achieve and enjoy long-term exercise adherence, including variety, self-motivation, self-efficacy, and positive decision making. Benefits of exercise include fitness, good  health, weight management, more energy, better sleep, less stress, and overall well-being.  Medical   Heart Disease Risk Reduction Clinical staff conducted group or individual video education with verbal and written material and guidebook.  Patient learns our heart is our most vital organ as it circulates oxygen, nutrients, white blood cells, and hormones throughout the entire body, and carries waste away. Data supports a plant-based eating plan like the Pritikin Program for its effectiveness in  slowing progression of and reversing heart disease. The video provides a number of recommendations to address heart disease.   Metabolic Syndrome and Belly Fat  Clinical staff conducted group or individual video education with verbal and written material and guidebook.  Patient learns what metabolic syndrome is, how it leads to heart disease, and how one can reverse it and keep it from coming back. You have metabolic syndrome if you have 3 of the following 5 criteria: abdominal obesity, high blood pressure, high triglycerides, low HDL cholesterol, and high blood sugar.  Hypertension and Heart Disease Clinical staff conducted group or individual video education with verbal and written material and guidebook.  Patient learns that high blood pressure, or hypertension, is very common in the [place]. Hypertension is largely due to excessive salt intake, but other important risk factors include being overweight, physical inactivity, drinking too much alcohol, smoking, and not eating enough potassium from fruits and vegetables. High blood pressure is a leading risk factor for heart attack, stroke, congestive heart failure, dementia, kidney failure, and premature death. Long-term effects of excessive salt intake include stiffening of the arteries and thickening of heart muscle and organ damage. Recommendations include ways to reduce hypertension and the risk of heart disease.  Diseases of Our Time - Focusing on Diabetes Clinical staff conducted group or individual video education with verbal and written material and guidebook.  Patient learns why the best way to stop diseases of our time is prevention, through food and other lifestyle changes. Medicine (such as prescription pills and surgeries) is often only a Band-Aid on the problem, not a long-term solution. Most common diseases of our time include obesity, type 2 diabetes, hypertension, heart disease, and cancer. The Pritikin Program is recommended and  has been proven to help reduce, reverse, and/or prevent the damaging effects of metabolic syndrome.  Nutrition   Overview of the Pritikin Eating Plan  Clinical staff conducted group or individual video education with verbal and written material and guidebook.  Patient learns about the [place] for disease risk reduction. The [place] emphasizes a wide variety of unrefined, minimally-processed carbohydrates, like fruits, vegetables, whole grains, and legumes. Go, Caution, and Stop food choices are explained. Plant-based and lean animal proteins are emphasized. Rationale provided for low sodium intake for blood pressure control, low added sugars for blood sugar stabilization, and low added fats and oils for coronary artery disease risk reduction and weight management.  Calorie Density  Clinical staff conducted group or individual video education with verbal and written material and guidebook.  Patient learns about calorie density and how it impacts the Pritikin Eating Plan. Knowing the characteristics of the food you choose will help you decide whether those foods will lead to weight gain or weight loss, and whether you want to consume more or less of them. Weight loss is usually a side effect of the Pritikin Eating Plan because of its focus on low calorie-dense foods.  Label Reading  Clinical staff conducted group or individual video education with verbal and written  material and guidebook.  Patient learns about the Pritikin recommended label reading guidelines and corresponding recommendations regarding calorie density, added sugars, sodium content, and whole grains.  Dining Out - Part 1  Clinical staff conducted group or individual video education with verbal and written material and guidebook.  Patient learns that restaurant meals can be sabotaging because they can be so high in calories, fat, sodium, and/or sugar. Patient learns recommended strategies on how to positively  address this and avoid unhealthy pitfalls.  Facts on Fats  Clinical staff conducted group or individual video education with verbal and written material and guidebook.  Patient learns that lifestyle modifications can be just as effective, if not more so, as many medications for lowering your risk of heart disease. A Pritikin lifestyle can help to reduce your risk of inflammation and atherosclerosis (cholesterol build-up, or plaque, in the artery walls). Lifestyle interventions such as dietary choices and physical activity address the cause of atherosclerosis. A review of the types of fats and their impact on blood cholesterol levels, along with dietary recommendations to reduce fat intake is also included.  Nutrition Action Plan  Clinical staff conducted group or individual video education with verbal and written material and guidebook.  Patient learns how to incorporate Pritikin recommendations into their lifestyle. Recommendations include planning and keeping personal health goals in mind as an important part of their success.  Healthy Mind-Set    Healthy Minds, Bodies, Hearts  Clinical staff conducted group or individual video education with verbal and written material and guidebook.  Patient learns how to identify when they are stressed. Video will discuss the impact of that stress, as well as the many benefits of stress management. Patient will also be introduced to stress management techniques. The way we think, act, and feel has an impact on our hearts.  How Our Thoughts Can Heal Our Hearts  Clinical staff conducted group or individual video education with verbal and written material and guidebook.  Patient learns that negative thoughts can cause depression and anxiety. This can result in negative lifestyle behavior and serious health problems. Cognitive behavioral therapy is an effective method to help control our thoughts in order to change and improve our emotional outlook.  Additional  Videos:  Exercise    Improving Performance  Clinical staff conducted group or individual video education with verbal and written material and guidebook.  Patient learns to use a non-linear approach by alternating intensity levels and lengths of time spent exercising to help burn more calories and lose more body fat. Cardiovascular exercise helps improve heart health, metabolism, hormonal balance, blood sugar control, and recovery from fatigue. Resistance training improves strength, endurance, balance, coordination, reaction time, metabolism, and muscle mass. Flexibility exercise improves circulation, posture, and balance. Seek guidance from your physician and exercise physiologist before implementing an exercise routine and learn your capabilities and proper form for all exercise.  Introduction to Yoga  Clinical staff conducted group or individual video education with verbal and written material and guidebook.  Patient learns about yoga, a discipline of the coming together of mind, breath, and body. The benefits of yoga include improved flexibility, improved range of motion, better posture and core strength, increased lung function, weight loss, and positive self-image. Yoga's heart health benefits include lowered blood pressure, healthier heart rate, decreased cholesterol and triglyceride levels, improved immune function, and reduced stress. Seek guidance from your physician and exercise physiologist before implementing an exercise routine and learn your capabilities and proper form for all exercise.  Medical  Aging: Automotive engineer of Life  Clinical staff conducted group or individual video education with verbal and written material and guidebook.  Patient learns key strategies and recommendations to stay in good physical health and enhance quality of life, such as prevention strategies, having an advocate, securing a [place], and keeping a list of medications  and system for tracking them. It also discusses how to avoid risk for bone loss.  Biology of Weight Control  Clinical staff conducted group or individual video education with verbal and written material and guidebook.  Patient learns that weight gain occurs because we consume more calories than we burn (eating more, moving less). Even if your body weight is normal, you may have higher ratios of fat compared to muscle mass. Too much body fat puts you at increased risk for cardiovascular disease, heart attack, stroke, type 2 diabetes, and obesity-related cancers. In addition to exercise, following the [place] can help reduce your risk.  Decoding Lab Results  Clinical staff conducted group or individual video education with verbal and written material and guidebook.  Patient learns that lab test reflects one measurement whose values change over time and are influenced by many factors, including medication, stress, sleep, exercise, food, hydration, pre-existing medical conditions, and more. It is recommended to use the knowledge from this video to become more involved with your lab results and evaluate your numbers to speak with your doctor.   Diseases of Our Time - Overview  Clinical staff conducted group or individual video education with verbal and written material and guidebook.  Patient learns that according to the CDC, 50% to 70% of chronic diseases (such as obesity, type 2 diabetes, elevated lipids, hypertension, and heart disease) are avoidable through lifestyle improvements including healthier food choices, listening to satiety cues, and increased physical activity.  Sleep Disorders Clinical staff conducted group or individual video education with verbal and written material and guidebook.  Patient learns how good quality and duration of sleep are important to overall health and well-being. Patient also learns about sleep disorders and how they impact health along with  recommendations to address them, including discussing with a physician.  Nutrition  Dining Out - Part 2 Clinical staff conducted group or individual video education with verbal and written material and guidebook.  Patient learns how to plan ahead and communicate in order to maximize their dining experience in a healthy and nutritious manner. Included are recommended food choices based on the type of restaurant the patient is visiting.   Fueling a Best boy conducted group or individual video education with verbal and written material and guidebook.  There is a strong connection between our food choices and our health. Diseases like obesity and type 2 diabetes are very prevalent and are in large-part due to lifestyle choices. The Pritikin Eating Plan provides plenty of food and hunger-curbing satisfaction. It is easy to follow, affordable, and helps reduce health risks.  Menu Workshop  Clinical staff conducted group or individual video education with verbal and written material and guidebook.  Patient learns that restaurant meals can sabotage health goals because they are often packed with calories, fat, sodium, and sugar. Recommendations include strategies to plan ahead and to communicate with the manager, chef, or server to help order a healthier meal.  Planning Your Eating Strategy  Clinical staff conducted group or individual video education with verbal and written material and guidebook.  Patient learns about the [place]  and its benefit of reducing the risk of disease. The [place] does not focus on calories. Instead, it emphasizes high-quality, nutrient-rich foods. By knowing the characteristics of the foods, we choose, we can determine their calorie density and make informed decisions.  Targeting Your Nutrition Priorities  Clinical staff conducted group or individual video education with verbal and written material and guidebook.  Patient learns  that lifestyle habits have a tremendous impact on disease risk and progression. This video provides eating and physical activity recommendations based on your personal health goals, such as reducing LDL cholesterol, losing weight, preventing or controlling type 2 diabetes, and reducing high blood pressure.  Vitamins and Minerals  Clinical staff conducted group or individual video education with verbal and written material and guidebook.  Patient learns different ways to obtain key vitamins and minerals, including through a recommended healthy diet. It is important to discuss all supplements you take with your doctor.   Healthy Mind-Set    Smoking Cessation  Clinical staff conducted group or individual video education with verbal and written material and guidebook.  Patient learns that cigarette smoking and tobacco addiction pose a serious health risk which affects millions of people. Stopping smoking will significantly reduce the risk of heart disease, lung disease, and many forms of cancer. Recommended strategies for quitting are covered, including working with your doctor to develop a successful plan.  Culinary   Becoming a Financial trader conducted group or individual video education with verbal and written material and guidebook.  Patient learns that cooking at home can be healthy, cost-effective, quick, and puts them in control. Keys to cooking healthy recipes will include looking at your recipe, assessing your equipment needs, planning ahead, making it simple, choosing cost-effective seasonal ingredients, and limiting the use of added fats, salts, and sugars.  Cooking - Breakfast and Snacks  Clinical staff conducted group or individual video education with verbal and written material and guidebook.  Patient learns how important breakfast is to satiety and nutrition through the entire day. Recommendations include key foods to eat during breakfast to help stabilize blood sugar  levels and to prevent overeating at meals later in the day. Planning ahead is also a key component.  Cooking - Human resources officer conducted group or individual video education with verbal and written material and guidebook.  Patient learns eating strategies to improve overall health, including an approach to cook more at home. Recommendations include thinking of animal protein as a side on your plate rather than center stage and focusing instead on lower calorie dense options like vegetables, fruits, whole grains, and plant-based proteins, such as beans. Making sauces in large quantities to freeze for later and leaving the skin on your vegetables are also recommended to maximize your experience.  Cooking - Healthy Salads and Dressing Clinical staff conducted group or individual video education with verbal and written material and guidebook.  Patient learns that vegetables, fruits, whole grains, and legumes are the foundations of the [place]. Recommendations include how to incorporate each of these in flavorful and healthy salads, and how to create homemade salad dressings. Proper handling of ingredients is also covered. Cooking - Soups and [employer] - Soups and Desserts Clinical staff conducted group or individual video education with verbal and written material and guidebook.  Patient learns that Pritikin soups and desserts make for easy, nutritious, and delicious snacks and meal components that are low in sodium, fat, sugar, and calorie density,  while high in vitamins, minerals, and filling fiber. Recommendations include simple and healthy ideas for soups and desserts.   Overview     The Pritikin Solution Program Overview Clinical staff conducted group or individual video education with verbal and written material and guidebook.  Patient learns that the results of the [place] Program have been documented in more than 100 articles published in peer-reviewed  journals, and the benefits include reducing risk factors for (and, in some cases, even reversing) high cholesterol, high blood pressure, type 2 diabetes, obesity, and more! An overview of the three key pillars of the Pritikin Program will be covered: eating well, doing regular exercise, and having a healthy mind-set.  WORKSHOPS  Exercise: Exercise Basics: Building Your Action Plan Clinical staff led group instruction and group discussion with PowerPoint presentation and patient guidebook. To enhance the learning environment the use of posters, models and videos may be added. At the conclusion of this workshop, patients will comprehend the difference between physical activity and exercise, as well as the benefits of incorporating both, into their routine. Patients will understand the FITT (Frequency, Intensity, Time, and Type) principle and how to use it to build an exercise action plan. In addition, safety concerns and other considerations for exercise and cardiac rehab will be addressed by the presenter. The purpose of this lesson is to promote a comprehensive and effective weekly exercise routine in order to improve patients' overall level of fitness.   Managing Heart Disease: Your Path to a Healthier Heart Clinical staff led group instruction and group discussion with PowerPoint presentation and patient guidebook. To enhance the learning environment the use of posters, models and videos may be added.At the conclusion of this workshop, patients will understand the anatomy and physiology of the heart. Additionally, they will understand how Pritikin's three pillars impact the risk factors, the progression, and the management of heart disease.  The purpose of this lesson is to provide a high-level overview of the heart, heart disease, and how the Pritikin lifestyle positively impacts risk factors.  Exercise Biomechanics Clinical staff led group instruction and group discussion with PowerPoint  presentation and patient guidebook. To enhance the learning environment the use of posters, models and videos may be added. Patients will learn how the structural parts of their bodies function and how these functions impact their daily activities, movement, and exercise. Patients will learn how to promote a neutral spine, learn how to manage pain, and identify ways to improve their physical movement in order to promote healthy living. The purpose of this lesson is to expose patients to common physical limitations that impact physical activity. Participants will learn practical ways to adapt and manage aches and pains, and to minimize their effect on regular exercise. Patients will learn how to maintain good posture while sitting, walking, and lifting.  Balance Training and Fall Prevention  Clinical staff led group instruction and group discussion with PowerPoint presentation and patient guidebook. To enhance the learning environment the use of posters, models and videos may be added. At the conclusion of this workshop, patients will understand the importance of their sensorimotor skills (vision, proprioception, and the vestibular system) in maintaining their ability to balance as they age. Patients will apply a variety of balancing exercises that are appropriate for their current level of function. Patients will understand the common causes for poor balance, possible solutions to these problems, and ways to modify their physical environment in order to minimize their fall risk. The purpose of this lesson is to teach  patients about the importance of maintaining balance as they age and ways to minimize their risk of falling.  WORKSHOPS   Nutrition:  Fueling a Scientist, research (physical sciences) led group instruction and group discussion with PowerPoint presentation and patient guidebook. To enhance the learning environment the use of posters, models and videos may be added. Patients will review the  foundational principles of the [place] and understand what constitutes a serving size in each of the food groups. Patients will also learn Pritikin-friendly foods that are better choices when away from home and review make-ahead meal and snack options. Calorie density will be reviewed and applied to three nutrition priorities: weight maintenance, weight loss, and weight gain. The purpose of this lesson is to reinforce (in a group setting) the key concepts around what patients are recommended to eat and how to apply these guidelines when away from home by planning and selecting Pritikin-friendly options. Patients will understand how calorie density may be adjusted for different weight management goals.  Mindful Eating  Clinical staff led group instruction and group discussion with PowerPoint presentation and patient guidebook. To enhance the learning environment the use of posters, models and videos may be added. Patients will briefly review the concepts of the [place] and the importance of low-calorie dense foods. The concept of mindful eating will be introduced as well as the importance of paying attention to internal hunger signals. Triggers for non-hunger eating and techniques for dealing with triggers will be explored. The purpose of this lesson is to provide patients with the opportunity to review the basic principles of the [place], discuss the value of eating mindfully and how to measure internal cues of hunger and fullness using the Hunger Scale. Patients will also discuss reasons for non-hunger eating and learn strategies to use for controlling emotional eating.  Targeting Your Nutrition Priorities Clinical staff led group instruction and group discussion with PowerPoint presentation and patient guidebook. To enhance the learning environment the use of posters, models and videos may be added. Patients will learn how to determine their genetic susceptibility to  disease by reviewing their family history. Patients will gain insight into the importance of diet as part of an overall healthy lifestyle in mitigating the impact of genetics and other environmental insults. The purpose of this lesson is to provide patients with the opportunity to assess their personal nutrition priorities by looking at their family history, their own health history and current risk factors. Patients will also be able to discuss ways of prioritizing and modifying the [place] for their highest risk areas  Menu  Clinical staff led group instruction and group discussion with PowerPoint presentation and patient guidebook. To enhance the learning environment the use of posters, models and videos may be added. Using menus brought in from [employer], or printed from [employer], patients will apply the [place] dining out guidelines that were presented in the [employer] video. Patients will also be able to practice these guidelines in a variety of provided scenarios. The purpose of this lesson is to provide patients with the opportunity to practice hands-on learning of the [place] with actual menus and practice scenarios.  Label Reading Clinical staff led group instruction and group discussion with PowerPoint presentation and patient guidebook. To enhance the learning environment the use of posters, models and videos may be added. Patients will review and discuss the Pritikin label reading guidelines presented in Pritikin's Label Reading Educational series  video. Using fool labels brought in from local grocery stores and markets, patients will apply the label reading guidelines and determine if the packaged food meet the Pritikin guidelines. The purpose of this lesson is to provide patients with the opportunity to review, discuss, and practice hands-on learning of the Pritikin Label Reading guidelines with actual packaged food  labels. [place] Workshops are designed to teach patients ways to prepare quick, simple, and affordable recipes at home. The importance of nutrition's role in chronic disease risk reduction is reflected in its emphasis in the overall Pritikin program. By learning how to prepare essential core Pritikin Eating Plan recipes, patients will increase control over what they eat; be able to customize the flavor of foods without the use of added salt, sugar, or fat; and improve the quality of the food they consume. By learning a set of core recipes which are easily assembled, quickly prepared, and affordable, patients are more likely to prepare more healthy foods at home. These workshops focus on convenient breakfasts, simple entres, side dishes, and desserts which can be prepared with minimal effort and are consistent with nutrition recommendations for cardiovascular risk reduction. Cooking [employer] are taught by a Engineer, materials (RD) who has been trained by the [employer]. The chef or RD has a clear understanding of the importance of minimizing - if not completely eliminating - added fat, sugar, and sodium in recipes. Throughout the series of [place] Workshop sessions, patients will learn about healthy ingredients and efficient methods of cooking to build confidence in their capability to prepare    Cooking School weekly topics:  Adding Flavor- Sodium-Free  Fast and Healthy Breakfasts  Powerhouse Plant-Based Proteins  Satisfying Salads and Dressings  Simple Sides and Sauces  International Cuisine-Spotlight on the [place] Zones  Delicious Desserts  Savory Soups  Efficiency Cooking - Meals in a Snap  Tasty Appetizers and Snacks  Comforting Weekend Breakfasts  One-Pot Wonders   Fast Evening Meals  Easy [place] (Psychosocial): New Thoughts, New  Behaviors Clinical staff led group instruction and group discussion with PowerPoint presentation and patient guidebook. To enhance the learning environment the use of posters, models and videos may be added. Patients will learn and practice techniques for developing effective health and lifestyle goals. Patients will be able to effectively apply the goal setting process learned to develop at least one new personal goal.  The purpose of this lesson is to expose patients to a new skill set of behavior modification techniques such as techniques setting SMART goals, overcoming barriers, and achieving new thoughts and new behaviors.  Managing Moods and Relationships Clinical staff led group instruction and group discussion with PowerPoint presentation and patient guidebook. To enhance the learning environment the use of posters, models and videos may be added. Patients will learn how emotional and chronic stress factors can impact their health and relationships. They will learn healthy ways to manage their moods and utilize positive coping mechanisms. In addition, ICR patients will learn ways to improve communication skills. The purpose of this lesson is to expose patients to ways of understanding how one's mood and health are intimately connected. Developing a healthy outlook can help build positive relationships and connections with others. Patients will understand the importance of utilizing effective communication skills that include actively listening and being heard. They will learn and understand the importance of the "4 Cs" and especially Connections  in fostering of a Healthy Mind-Set.  Healthy Sleep for a Healthy Heart Clinical staff led group instruction and group discussion with PowerPoint presentation and patient guidebook. To enhance the learning environment the use of posters, models and videos may be added. At the conclusion of this workshop, patients will be able to demonstrate knowledge of the  importance of sleep to overall health, well-being, and quality of life. They will understand the symptoms of, and treatments for, common sleep disorders. Patients will also be able to identify daytime and nighttime behaviors which impact sleep, and they will be able to apply these tools to help manage sleep-related challenges. The purpose of this lesson is to provide patients with a general overview of sleep and outline the importance of quality sleep. Patients will learn about a few of the most common sleep disorders. Patients will also be introduced to the concept of "sleep hygiene," and discover ways to self-manage certain sleeping problems through simple daily behavior changes. Finally, the workshop will motivate patients by clarifying the links between quality sleep and their goals of heart-healthy living.   Recognizing and Reducing Stress Clinical staff led group instruction and group discussion with PowerPoint presentation and patient guidebook. To enhance the learning environment the use of posters, models and videos may be added. At the conclusion of this workshop, patients will be able to understand the types of stress reactions, differentiate between acute and chronic stress, and recognize the impact that chronic stress has on their health. They will also be able to apply different coping mechanisms, such as reframing negative self-talk. Patients will have the opportunity to practice a variety of stress management techniques, such as deep abdominal breathing, progressive muscle relaxation, and/or guided imagery.  The purpose of this lesson is to educate patients on the role of stress in their lives and to provide healthy techniques for coping with it.  Learning Barriers/Preferences:  Learning Barriers/Preferences - 09/05/22 1524       Learning Barriers/Preferences   Learning Barriers Sight   wears reading glasses   Learning Preferences Computer/Internet;Written Material              Education Topics:  Knowledge Questionnaire Score:  Knowledge Questionnaire Score - 09/05/22 1525       Knowledge Questionnaire Score   Pre Score 24/24             Core Components/Risk Factors/Patient Goals at Admission:  Personal Goals and Risk Factors at Admission - 09/05/22 1525       Core Components/Risk Factors/Patient Goals on Admission   Lipids Yes    Intervention Provide education and support for participant on nutrition & aerobic/resistive exercise along with prescribed medications to achieve LDL '70mg'$ , HDL >'40mg'$ .    Expected Outcomes Short Term: Participant states understanding of desired cholesterol values and is compliant with medications prescribed. Participant is following exercise prescription and nutrition guidelines.;Long Term: Cholesterol controlled with medications as prescribed, with individualized exercise RX and with personalized nutrition plan. Value goals: LDL < '70mg'$ , HDL > 40 mg.             Core Components/Risk Factors/Patient Goals Review:    Core Components/Risk Factors/Patient Goals at Discharge (Final Review):    ITP Comments:  ITP Comments     Row Name 09/05/22 1508           ITP Comments Dr Fransico Him MD, Medical Director. Intorduction to [employer] Program/ Intensive Cardiac rehab. Initial Orientation Packet Reviewed with the patient  Comments: Participant attended orientation for the cardiac rehabilitation program on  09/05/2022  to perform initial intake and exercise walk test. Patient introduced to the [place] education and orientation packet was reviewed. Completed 6-minute walk test, measurements, initial ITP, and exercise prescription. Vital signs stable. Telemetry-normal sinus rhythm, asymptomatic.Harrell Gave RN BSN   Service time was from 1008 to 1210.

## 2022-09-05 NOTE — Progress Notes (Signed)
Cardiac Rehab Medication Review by a Nurse  Does the patient  feel that his/her medications are working for him/her?  yes  Has the patient been experiencing any side effects to the medications prescribed?  no  Does the patient measure his/her own blood pressure or blood glucose at home?  yes   Does the patient have any problems obtaining medications due to transportation or finances?   no  Understanding of regimen: excellent Understanding of indications: excellent Potential of compliance: excellent    Nurse comments: Mark Fitzgerald is taking his medications as prescribed and has a good understanding of what his medications are for. Mark Fitzgerald does not check his blood pressure on a regular basis.     Christa See Quoc Tome RN 09/05/2022 3:15 PM

## 2022-09-11 ENCOUNTER — Encounter (HOSPITAL_COMMUNITY)
Admission: RE | Admit: 2022-09-11 | Discharge: 2022-09-11 | Disposition: A | Discharge status: Home / Self Care | Payer: PPO | Source: Ambulatory Visit | Attending: Cardiology | Admitting: Cardiology

## 2022-09-11 DIAGNOSIS — Z48812 Encounter for surgical aftercare following surgery on the circulatory system: Secondary | ICD-10-CM | POA: Diagnosis not present

## 2022-09-11 DIAGNOSIS — Z955 Presence of coronary angioplasty implant and graft: Secondary | ICD-10-CM

## 2022-09-11 NOTE — Progress Notes (Signed)
Daily Session Note  Patient Details  Name: Mark Fitzgerald MRN: [id number] Date of Birth: 1956-02-07 Referring Provider:   Flowsheet Row INTENSIVE CARDIAC REHAB ORIENT from 09/05/2022 in [facility]  Referring Provider Mark Kaufman, MD       Encounter Date: 09/11/2022  Check In:  Session Check In - 09/11/22 1300       Check-In   Supervising physician immediately available to respond to emergencies [facility] - Physician supervision    Physician(s) Dr. Sallyanne Fitzgerald    Location [facility]    Staff Present Mark Pall, RN, Mark Glazier, MS, ACSM-CEP, CCRP, Exercise Physiologist;Mark Fitzgerald BS, ACSM-CEP, Exercise Physiologist;Mark Pearline Cables, MS, Exercise Physiologist    Virtual Visit No    Medication changes reported     No    Fall or balance concerns reported    No    Tobacco Cessation No Change    Current number of cigarettes/nicotine per day     0    Warm-up and Cool-down Performed as group-led instruction   Cardiac Rehab orientation   Resistance Training Performed Yes    VAD Patient? No    PAD/SET Patient? No      Pain Assessment   Currently in Pain? No/denies    Pain Score 0-No pain    Multiple Pain Sites No             Capillary Blood Glucose: No results found for this or any previous visit (from the past 24 hour(s)).   Exercise Prescription Changes - 09/11/22 1400       Response to Exercise   Blood Pressure (Admit) 99/63    Blood Pressure (Exercise) 122/80    Blood Pressure (Exit) 102/72    Heart Rate (Admit) 80 bpm    Heart Rate (Exercise) 115 bpm    Heart Rate (Exit) 80 bpm    Rating of Perceived Exertion (Exercise) 12    Symptoms None    Comments Pt's first day in the CRP2 program    Duration Continue with 30 min of aerobic exercise without signs/symptoms of physical distress.    Intensity THRR unchanged      Progression   Progression Continue to progress workloads to  maintain intensity without signs/symptoms of physical distress.    Average METs 3      Resistance Training   Training Prescription Yes    Weight 4 lbs    Reps 10-15    Time 10 Minutes      Interval Training   Interval Training No      Arm/Foot Ergometer   Level 2    Watts 60    Minutes 15      Biostep-RELP   Level 2    SPM 50    Minutes 15    METs 3             Social History   Tobacco Use  Smoking Status Never  Smokeless Tobacco Never    Goals Met:  Exercise tolerated well No report of concerns or symptoms today Strength training completed today  Goals Unmet:  Not Applicable  Comments: [name] started cardiac rehab today.  Pt tolerated light exercise without difficulty. VSS, telemetry-Sinus Rhythm, asymptomatic.  Medication list reconciled. Pt denies barriers to medicaiton compliance.  PSYCHOSOCIAL ASSESSMENT:  PHQ-0. Pt exhibits positive coping skills, hopeful outlook with supportive family. No psychosocial needs identified at this time, no psychosocial interventions necessary.    Pt enjoys playing tennis,  pickle ball, yard work and going to the gym  Pt oriented to exercise equipment and routine.    Understanding verbalized.Mark Gave RN BSN    Dr. Fransico Fitzgerald is Medical Director for Cardiac Rehab at [facility].

## 2022-09-13 ENCOUNTER — Encounter (HOSPITAL_COMMUNITY)
Admission: RE | Admit: 2022-09-13 | Discharge: 2022-09-13 | Disposition: A | Discharge status: Home / Self Care | Payer: PPO | Source: Ambulatory Visit | Attending: Cardiology | Admitting: Cardiology

## 2022-09-13 DIAGNOSIS — Z48812 Encounter for surgical aftercare following surgery on the circulatory system: Secondary | ICD-10-CM | POA: Diagnosis not present

## 2022-09-13 DIAGNOSIS — Z955 Presence of coronary angioplasty implant and graft: Secondary | ICD-10-CM

## 2022-09-15 ENCOUNTER — Encounter (HOSPITAL_COMMUNITY): Payer: PPO

## 2022-09-18 ENCOUNTER — Encounter (HOSPITAL_COMMUNITY)
Admission: RE | Admit: 2022-09-18 | Discharge: 2022-09-18 | Disposition: A | Discharge status: Home / Self Care | Payer: PPO | Source: Ambulatory Visit | Attending: Cardiology | Admitting: Cardiology

## 2022-09-18 DIAGNOSIS — Z955 Presence of coronary angioplasty implant and graft: Secondary | ICD-10-CM

## 2022-09-18 DIAGNOSIS — Z48812 Encounter for surgical aftercare following surgery on the circulatory system: Secondary | ICD-10-CM | POA: Diagnosis not present

## 2022-09-19 NOTE — Progress Notes (Unsigned)
Cardiology Office Note:    Date:  09/19/2022   ID:  LONN IM, DOB 11/15/1955, MRN [id number]  PCP:  Burnard Bunting, MD   [place] Prov[facility] Cardiologist:  Freada Bergeron, MD    Referring MD: Burnard Bunting, MD   History of Present Illness:    Mark Fitzgerald is a 66 y.o. male with a hx of CAD s/p LAD-DES 06/05/2022 and HLD who presents to clinic for follow-up.  Patient presented to hospital August 2023 for chest pain.  Coronary CT was abnormal.  Underwent cardiac catheterization showing 40% left main and 85% proximal LAD stenosis. LAD was treated with DES + plaque modification using intravascular lithotripsy x 10 treatments. There was slow flow in diffusely disease D1 which produced ongoing CP at the end of procedure, pain improved with nitro SL.  Plan for dual antiplatelet therapy 12 months.  Placed on high intensity statin. During follow-up patient continued to have mild chest discomfort.  Imdur added.   Seen in ER 08/10/22 for chest pain which occurred at rest and was vaguely reminiscent of his previous anginal discomfort, but did not have the typical radiation to the interscapular area that he had before. EKG without acute changes. Trop negative.     Was last seen by Robbie Lis 08/15/22 where he continued to have intermittent chest discomfort. Lower dose of imdur was added at that time.  Today, ***  Past Medical History:  Diagnosis Date   CAD (coronary artery disease)    s/p LAD stent 05/2022   Healthy adult on routine physical examination    Hyperlipidemia LDL goal <70     Past Surgical History:  Procedure Laterality Date   BACK SURGERY     bicep surgery Right    shoulder   COLONOSCOPY     CORONARY LITHOTRIPSY N/A 06/05/2022   Procedure: CORONARY LITHOTRIPSY;  Surgeon: Belva Crome, MD;  Location: [facility];  Service: Cardiovascular;  Laterality: N/A;   CORONARY STENT INTERVENTION N/A 06/05/2022   Procedure: CORONARY STENT  INTERVENTION;  Surgeon: Belva Crome, MD;  Location: [facility];  Service: Cardiovascular;  Laterality: N/A;   INTRAVASCULAR ULTRASOUND/IVUS N/A 06/05/2022   Procedure: Intravascular Ultrasound/IVUS;  Surgeon: Belva Crome, MD;  Location: [facility];  Service: Cardiovascular;  Laterality: N/A;   LEFT HEART CATH AND CORONARY ANGIOGRAPHY N/A 06/05/2022   Procedure: LEFT HEART CATH AND CORONARY ANGIOGRAPHY;  Surgeon: Belva Crome, MD;  Location: [facility];  Service: Cardiovascular;  Laterality: N/A;   WRIST SURGERY Right     Current Medications: No outpatient medications have been marked as taking for the 09/20/22 encounter (Appointment) with Freada Bergeron, MD.     Allergies:   Patient has no known allergies.   Social History   Socioeconomic History   Marital status: Married    Spouse name: Not on file   Number of children: Not on file   Years of education: Not on file   Highest education level: Not on file  Occupational History   Not on file  Tobacco Use   Smoking status: Never   Smokeless tobacco: Never  Vaping Use   Vaping Use: Never used  Substance and Sexual Activity   Alcohol use: Yes    Comment: social    Drug use: Never   Sexual activity: Not on file  Other Topics Concern   Not on file  Social History Narrative   Not on file  Social Determinants of Health   Financial Resource Strain: Not on file  Food Insecurity: Not on file  Transportation Needs: Not on file  Physical Activity: Not on file  Stress: Not on file  Social Connections: Not on file     Family History: The patient's ***family history includes Cancer in his mother; Heart attack in his brother; Pancreatic cancer in his father. There is no history of Colon cancer, Colon polyps, Esophageal cancer, Stomach cancer, or Rectal cancer.  ROS:   Please see the history of present illness.    *** All other systems reviewed and are negative.  EKGs/Labs/Other Studies  Reviewed:    The following studies were reviewed today:  Cardiac catheterization 06/05/2022   85% heavily calcified proximal to mid LAD stenosis reduced to less than 10% with TIMI grade III flow using a 34 x 3.0 Onyx postdilated to 3.25 at high pressure.  Plaque modification using intravascular lithotripsy 3.5 x 10 device with 10 treatments applied.  Slow flow in a diffusely diseased first diagonal produced ongoing discomfort at the end of the procedure better resolved by the time the patient left the Cath Lab. 35 to 45% distal left main Arteries otherwise have diffuse luminal irregularities but no focal high-grade obstruction. Overall normal LV function with EDP 14 mmHg.   RECOMMENDATIONS:   IV nitroglycerin until chest discomfort resolves.  Flow in the diagonal is improving at the time of procedure completion.  Patient had minimal symptoms at discharge from the Cath Lab.  EKG showed no ischemic changes. Aggressive risk factor modification. Dual antiplatelet therapy for at least 12 months.  Consider de-escalation of ticagrelor to Plavix at 6 months in addition to aspirin.  At the end of 12 months, consider clopidogrel monotherapy.   Diagnostic Dominance: Right  Intervention        Echocardiogram 06/03/2022     1. Left ventricular ejection fraction, by estimation, is 60 to 65%. The  left ventricle has normal function. The left ventricle has no regional  wall motion abnormalities. Left ventricular diastolic parameters are  consistent with Grade I diastolic  dysfunction (impaired relaxation).   2. Right ventricular systolic function is normal. The right ventricular  size is normal. There is normal pulmonary artery systolic pressure. The  estimated right ventricular systolic pressure is 62.8 mmHg.   3. The mitral valve is grossly normal. Trivial mitral valve  regurgitation. No evidence of mitral stenosis.   4. The aortic valve is tricuspid. Aortic valve regurgitation is not   visualized. No aortic stenosis is present.   5. The inferior vena cava is normal in size with greater than 50%  respiratory variability, suggesting right atrial pressure of 3 mmHg.       EKG:  EKG is *** ordered today.  The ekg ordered today demonstrates ***  Recent Labs: 06/02/2022: TSH 1.529 07/26/2022: ALT 33 08/10/2022: BUN 14; Creatinine, Ser 1.14; Hemoglobin 14.3; Platelets 211; Potassium 4.1; Sodium 140  Recent Lipid Panel    Component Value Date/Time   CHOL 116 07/26/2022 0952   TRIG 66 07/26/2022 0952   HDL 56 07/26/2022 0952   CHOLHDL 2.1 07/26/2022 0952   CHOLHDL 3.2 06/03/2022 0254   VLDL 15 06/03/2022 0254   LDLCALC 46 07/26/2022 0952     Risk Assessment/Calculations:   {Does this patient have ATRIAL FIBRILLATION?:[phone]}  No BP recorded.  {Refresh Note OR Click here to enter BP  :1}***         Physical Exam:    VS:  There  were no vitals taken for this visit.    Wt Readings from Last 3 Encounters:  09/05/22 168 lb 14 oz (76.6 kg)  08/15/22 165 lb (74.8 kg)  08/10/22 165 lb (74.8 kg)     GEN: *** Well nourished, well developed in no acute distress HEENT: Normal NECK: No JVD; No carotid bruits LYMPHATICS: No lymphadenopathy CARDIAC: ***RRR, no murmurs, rubs, gallops RESPIRATORY:  Clear to auscultation without rales, wheezing or rhonchi  ABDOMEN: Soft, non-tender, non-distended MUSCULOSKELETAL:  No edema; No deformity  SKIN: Warm and dry NEUROLOGIC:  Alert and oriented x 3 PSYCHIATRIC:  Normal affect   ASSESSMENT:    No diagnosis found. PLAN:    In order of problems listed above:  #CAD s/p PCI to LAD: Cath 05/2022 with 85% heavily calcified prox-mid LAD stenosis s/p intravascular lithrotripsy and DES. Had jailed, small and diffusely diseased D1 which is likely the cause of residual chest pain. Currently, *** -Continue ASA '81mg'$  daily -Continue brilinta '90mg'$  BID -Continue imdur '15mg'$  daily -Continue crestor '40mg'$  daily -Nitro  prn  #HLD: -Continue crestor '40mg'$  daily -LDL at goal at 46      {Are you ordering a CV Procedure (e.g. stress test, cath, DCCV, TEE, etc)?   Press F2        :[id number]}    Medication Adjustments/Labs and Tests Ordered: Current medicines are reviewed at length with the patient today.  Concerns regarding medicines are outlined above.  No orders of the defined types were placed in this encounter.  No orders of the defined types were placed in this encounter.   There are no Patient Instructions on file for this visit.   Signed, Freada Bergeron, MD  09/19/2022 10:54 AM    [place]

## 2022-09-20 ENCOUNTER — Other Ambulatory Visit (HOSPITAL_COMMUNITY): Payer: Self-pay

## 2022-09-20 ENCOUNTER — Encounter (HOSPITAL_COMMUNITY)
Admission: RE | Admit: 2022-09-20 | Discharge: 2022-09-20 | Disposition: A | Discharge status: Home / Self Care | Payer: PPO | Source: Ambulatory Visit | Attending: Cardiology | Admitting: Cardiology

## 2022-09-20 ENCOUNTER — Encounter: Payer: Self-pay | Admitting: Cardiology

## 2022-09-20 ENCOUNTER — Ambulatory Visit: Payer: PPO | Attending: Cardiology | Admitting: Cardiology

## 2022-09-20 ENCOUNTER — Telehealth: Payer: Self-pay | Admitting: Cardiology

## 2022-09-20 VITALS — BP 82/58 | HR 76 | Ht 71.0 in | Wt 169.4 lb

## 2022-09-20 DIAGNOSIS — E785 Hyperlipidemia, unspecified: Secondary | ICD-10-CM

## 2022-09-20 DIAGNOSIS — I952 Hypotension due to drugs: Secondary | ICD-10-CM

## 2022-09-20 DIAGNOSIS — I2510 Atherosclerotic heart disease of native coronary artery without angina pectoris: Secondary | ICD-10-CM | POA: Diagnosis not present

## 2022-09-20 DIAGNOSIS — Z48812 Encounter for surgical aftercare following surgery on the circulatory system: Secondary | ICD-10-CM | POA: Diagnosis not present

## 2022-09-20 DIAGNOSIS — Z955 Presence of coronary angioplasty implant and graft: Secondary | ICD-10-CM | POA: Diagnosis not present

## 2022-09-20 MED ORDER — RANOLAZINE ER 500 MG PO TB12
500.0000 mg | ORAL_TABLET | Freq: Two times a day (BID) | ORAL | 3 refills | Status: DC
  Filled 2022-09-20: qty 120, 60d supply, fill #0

## 2022-09-20 MED ORDER — RANOLAZINE ER 500 MG PO TB12
500.0000 mg | ORAL_TABLET | Freq: Two times a day (BID) | ORAL | 3 refills | Status: DC
  Filled 2022-09-20: qty 180, 90d supply, fill #0
  Filled 2022-10-01 – 2022-12-03 (×3): qty 180, 90d supply, fill #1
  Filled ????-??-??: fill #1

## 2022-09-20 NOTE — Telephone Encounter (Signed)
Pt's medication was sent to pt's correct pharmacy as requested. Confirmation received.

## 2022-09-20 NOTE — Telephone Encounter (Signed)
*  STAT* If patient is at the pharmacy, call can be transferred to refill team.   1. Which medications need to be refilled? (please list name of each medication and dose if known)   ranolazine (RANEXA) 500 MG 12 hr tablet    2. Which pharmacy/location (including street and city if local pharmacy) is medication to be sent to? [place]   3. Do they need a 30 day or 90 day supply?  90 day  Refill was sent to the wrong pharmacy

## 2022-09-20 NOTE — Patient Instructions (Signed)
Medication Instructions:   STOP TAKING ISOSORBIDE MONONITRATE (IMDUR) NOW  START TAKING RANEXA 500 MG BY MOUTH TWICE DAILY  *If you need a refill on your cardiac medications before your next appointment, please call your pharmacy*    Follow-Up: At [facility], you and your health needs are our priority.  As part of our continuing mission to provide you with exceptional heart care, we have created designated Provider Care Teams.  These Care Teams include your primary Cardiologist (physician) and Advanced Practice Providers (APPs -  Physician Assistants and Nurse Practitioners) who all work together to provide you with the care you need, when you need it.  We recommend signing up for the patient portal called "MyChart".  Sign up information is provided on this After Visit Summary.  MyChart is used to connect with patients for Virtual Visits (Telemedicine).  Patients are able to view lab/test results, encounter notes, upcoming appointments, etc.  Non-urgent messages can be sent to your provider as well.   To learn more about what you can do with MyChart, go to [email or web address].    Your next appointment:   6 month(s)  The format for your next appointment:   In Person  Provider:   Freada Bergeron, MD     Important Information About Sugar

## 2022-09-20 NOTE — Progress Notes (Signed)
Cardiology Office Note:    Date:  09/20/2022   ID:  Mark Fitzgerald, DOB 09/28/1956, MRN [id number]  PCP:  Burnard Bunting, MD   [facility] Prov[facility] Cardiologist:  Freada Bergeron, MD    Referring MD: Burnard Bunting, MD   History of Present Illness:    Mark Fitzgerald is a 66 y.o. male with a hx of CAD s/p LAD-DES 06/05/2022 and HLD who presents to clinic for follow-up.  Patient presented to hospital August 2023 for chest pain.  Coronary CT was abnormal.  Underwent cardiac catheterization showing 40% left main and 85% proximal LAD stenosis. LAD was treated with DES + plaque modification using intravascular lithotripsy x 10 treatments. There was slow flow in diffusely disease D1 which produced ongoing CP at the end of procedure, pain improved with nitro SL.  Plan for dual antiplatelet therapy 12 months.  Placed on high intensity statin. During follow-up patient continued to have mild chest discomfort.  Imdur added.   Seen in ER 08/10/22 for chest pain which occurred at rest and was vaguely reminiscent of his previous anginal discomfort, but did not have the typical radiation to the interscapular area that he had before. EKG without acute changes. Trop negative.     Was last seen by Robbie Lis 08/15/22 where he continued to have intermittent chest discomfort. Lower dose of imdur was added at that time.  Today, he states that he has been doing okay since adding Imdur '15mg'$  in the mornings. He feels that the Imdur does help with his chest pain. He was having the chest pain daily prior to the However, he is having headaches ~4 days a week. His headaches improve with Tylenol.  He is exercising 5 days a week without difficulty. He denies any chest pain or excessive easy fatigability with exercise.  His blood pressure is 82/58 today. He notes some intermittent light-headedness and fatigue associated with his lower blood pressures.   We thoroughly discussed his heart cath findings  and stenting today. We also reviewed the risks vs. benefits of nitrates vs anti-anginals. He is agreeable to trying Ranexa '500mg'$  BID to see if this relieves his chest pain without affecting his blood pressure.  He denies any shortness of breath, orthopnea, or leg swelling.  Past Medical History:  Diagnosis Date   CAD (coronary artery disease)    s/p LAD stent 05/2022   Healthy adult on routine physical examination    Hyperlipidemia LDL goal <70     Past Surgical History:  Procedure Laterality Date   BACK SURGERY     bicep surgery Right    shoulder   COLONOSCOPY     CORONARY LITHOTRIPSY N/A 06/05/2022   Procedure: CORONARY LITHOTRIPSY;  Surgeon: Belva Crome, MD;  Location: [facility];  Service: Cardiovascular;  Laterality: N/A;   CORONARY STENT INTERVENTION N/A 06/05/2022   Procedure: CORONARY STENT INTERVENTION;  Surgeon: Belva Crome, MD;  Location: [facility];  Service: Cardiovascular;  Laterality: N/A;   INTRAVASCULAR ULTRASOUND/IVUS N/A 06/05/2022   Procedure: Intravascular Ultrasound/IVUS;  Surgeon: Belva Crome, MD;  Location: [facility];  Service: Cardiovascular;  Laterality: N/A;   LEFT HEART CATH AND CORONARY ANGIOGRAPHY N/A 06/05/2022   Procedure: LEFT HEART CATH AND CORONARY ANGIOGRAPHY;  Surgeon: Belva Crome, MD;  Location: [facility];  Service: Cardiovascular;  Laterality: N/A;   WRIST SURGERY Right     Current Medications: Current Meds  Medication Sig   aspirin  EC 81 MG tablet Take 1 tablet (81 mg total) by mouth daily. Swallow whole.   cyanocobalamin (VITAMIN B12) 500 MCG tablet Take 500 mcg by mouth daily.   Multiple Vitamins-Minerals (MULTIVITAMIN WITH MINERALS) tablet Take 1 tablet by mouth daily.   nitroGLYCERIN (NITROSTAT) 0.4 MG SL tablet Place 1 tablet (0.4 mg total) under the tongue every 5 (five) minutes as needed for chest pain. For up to 3 doses   ranolazine (RANEXA) 500 MG 12 hr tablet Take 1 tablet (500 mg total) by  mouth 2 (two) times daily.   rosuvastatin (CRESTOR) 40 MG tablet Take 1 tablet (40 mg total) by mouth daily.   tamsulosin (FLOMAX) 0.4 MG CAPS capsule Take 0.4 mg by mouth daily.   ticagrelor (BRILINTA) 90 MG TABS tablet Take 1 tablet (90 mg total) by mouth 2 (two) times daily.   [DISCONTINUED] isosorbide mononitrate (IMDUR) 30 MG 24 hr tablet Take 0.5 tablets (15 mg total) by mouth daily.     Allergies:   Patient has no known allergies.   Social History   Socioeconomic History   Marital status: Married    Spouse name: Not on file   Number of children: Not on file   Years of education: Not on file   Highest education level: Not on file  Occupational History   Not on file  Tobacco Use   Smoking status: Never   Smokeless tobacco: Never  Vaping Use   Vaping Use: Never used  Substance and Sexual Activity   Alcohol use: Yes    Comment: social    Drug use: Never   Sexual activity: Not on file  Other Topics Concern   Not on file  Social History Narrative   Not on file   Social Determinants of Health   Financial Resource Strain: Not on file  Food Insecurity: Not on file  Transportation Needs: Not on file  Physical Activity: Not on file  Stress: Not on file  Social Connections: Not on file     Family History: The patient's family history includes Cancer in his mother; Heart attack in his brother; Pancreatic cancer in his father. There is no history of Colon cancer, Colon polyps, Esophageal cancer, Stomach cancer, or Rectal cancer.  ROS:   Review of Systems  Constitutional:  Positive for malaise/fatigue. Negative for chills and fever.  HENT:  Negative for nosebleeds and tinnitus.   Eyes:  Negative for blurred vision and pain.  Respiratory:  Negative for cough, hemoptysis, shortness of breath and stridor.   Cardiovascular:  Negative for chest pain, palpitations, orthopnea, claudication, leg swelling and PND.  Gastrointestinal:  Negative for blood in stool, diarrhea, nausea  and vomiting.  Genitourinary:  Negative for dysuria and hematuria.  Musculoskeletal:  Negative for falls.  Neurological:  Positive for headaches. Negative for dizziness and loss of consciousness.       + lightheadedness  Psychiatric/Behavioral:  Negative for depression, hallucinations and substance abuse. The patient does not have insomnia.      EKGs/Labs/Other Studies Reviewed:    The following studies were reviewed today:  Cardiac catheterization 06/05/2022 85% heavily calcified proximal to mid LAD stenosis reduced to less than 10% with TIMI grade III flow using a 34 x 3.0 Onyx postdilated to 3.25 at high pressure.  Plaque modification using intravascular lithotripsy 3.5 x 10 device with 10 treatments applied.  Slow flow in a diffusely diseased first diagonal produced ongoing discomfort at the end of the procedure better resolved by the time the patient  left the Cath Lab. 35 to 45% distal left main Arteries otherwise have diffuse luminal irregularities but no focal high-grade obstruction. Overall normal LV function with EDP 14 mmHg.   RECOMMENDATIONS:   IV nitroglycerin until chest discomfort resolves.  Flow in the diagonal is improving at the time of procedure completion.  Patient had minimal symptoms at discharge from the Cath Lab.  EKG showed no ischemic changes. Aggressive risk factor modification. Dual antiplatelet therapy for at least 12 months.  Consider de-escalation of ticagrelor to Plavix at 6 months in addition to aspirin.  At the end of 12 months, consider clopidogrel monotherapy.   Diagnostic Dominance: Right  Intervention        Echocardiogram 06/03/2022  1. Left ventricular ejection fraction, by estimation, is 60 to 65%. The  left ventricle has normal function. The left ventricle has no regional  wall motion abnormalities. Left ventricular diastolic parameters are  consistent with Grade I diastolic  dysfunction (impaired relaxation).   2. Right ventricular  systolic function is normal. The right ventricular  size is normal. There is normal pulmonary artery systolic pressure. The  estimated right ventricular systolic pressure is 19.6 mmHg.   3. The mitral valve is grossly normal. Trivial mitral valve  regurgitation. No evidence of mitral stenosis.   4. The aortic valve is tricuspid. Aortic valve regurgitation is not  visualized. No aortic stenosis is present.   5. The inferior vena cava is normal in size with greater than 50%  respiratory variability, suggesting right atrial pressure of 3 mmHg.       EKG:  EKG was not ordered today.  Recent Labs: 06/02/2022: TSH 1.529 07/26/2022: ALT 33 08/10/2022: BUN 14; Creatinine, Ser 1.14; Hemoglobin 14.3; Platelets 211; Potassium 4.1; Sodium 140  Recent Lipid Panel    Component Value Date/Time   CHOL 116 07/26/2022 0952   TRIG 66 07/26/2022 0952   HDL 56 07/26/2022 0952   CHOLHDL 2.1 07/26/2022 0952   CHOLHDL 3.2 06/03/2022 0254   VLDL 15 06/03/2022 0254   LDLCALC 46 07/26/2022 0952     Risk Assessment/Calculations:                Physical Exam:    VS:  BP (!) 82/58   Pulse 76   Ht '5\' 11"'$  (1.803 m)   Wt 169 lb 6.4 oz (76.8 kg)   SpO2 98%   BMI 23.63 kg/m     Wt Readings from Last 3 Encounters:  09/20/22 169 lb 6.4 oz (76.8 kg)  09/05/22 168 lb 14 oz (76.6 kg)  08/15/22 165 lb (74.8 kg)     GEN:  Well nourished, well developed in no acute distress HEENT: Normal NECK: No JVD; No carotid bruits CARDIAC: RRR, no murmurs, rubs, gallops RESPIRATORY:  Clear to auscultation without rales, wheezing or rhonchi  ABDOMEN: Soft, non-tender, non-distended MUSCULOSKELETAL:  No edema; No deformity  SKIN: Warm and dry NEUROLOGIC:  Alert and oriented x 3 PSYCHIATRIC:  Normal affect   ASSESSMENT:    1. Coronary artery disease involving native heart without angina pectoris, unspecified vessel or lesion type   2. Hyperlipidemia, unspecified hyperlipidemia type   3. Hypotension due to  drugs   4. S/P coronary artery stent placement    PLAN:    In order of problems listed above:  #CAD s/p PCI to LAD: Cath 05/2022 with 85% heavily calcified prox-mid LAD stenosis s/p intravascular lithrotripsy and DES. Had jailed, small and diffusely diseased D1 which is likely the cause of residual  chest pain. Currently, his chest pain is controlled on imdur but BP in 80s. Will trial off the imdur and change to ranexa to see if this helps.  -Continue ASA '81mg'$  daily -Continue brilinta '90mg'$  BID -Change imdur to ranexa '500mg'$  BID -Continue crestor '40mg'$  daily -Nitro prn  #Hypotension with Dizziness: -Will trial off imdur as above and monitor response  #HLD: -Continue crestor '40mg'$  daily -LDL at goal at 46           Medication Adjustments/Labs and Tests Ordered: Current medicines are reviewed at length with the patient today.  Concerns regarding medicines are outlined above.  No orders of the defined types were placed in this encounter.  Meds ordered this encounter  Medications   ranolazine (RANEXA) 500 MG 12 hr tablet    Sig: Take 1 tablet (500 mg total) by mouth 2 (two) times daily.    Dispense:  180 tablet    Refill:  3    Patient Instructions  Medication Instructions:   STOP TAKING ISOSORBIDE MONONITRATE (IMDUR) NOW  START TAKING RANEXA 500 MG BY MOUTH TWICE DAILY  *If you need a refill on your cardiac medications before your next appointment, please call your pharmacy*    Follow-Up: At [facility], you and your health needs are our priority.  As part of our continuing mission to provide you with exceptional heart care, we have created designated Provider Care Teams.  These Care Teams include your primary Cardiologist (physician) and Advanced Practice Providers (APPs -  Physician Assistants and Nurse Practitioners) who all work together to provide you with the care you need, when you need it.  We recommend signing up for the patient portal called "MyChart".   Sign up information is provided on this After Visit Summary.  MyChart is used to connect with patients for Virtual Visits (Telemedicine).  Patients are able to view lab/test results, encounter notes, upcoming appointments, etc.  Non-urgent messages can be sent to your provider as well.   To learn more about what you can do with MyChart, go to [email or web address].    Your next appointment:   6 month(s)  The format for your next appointment:   In Person  Provider:   Freada Bergeron, MD     Important Information About Sugar          I,Alexis Herring,acting as a scribe for Freada Bergeron, MD.,have documented all relevant documentation on the behalf of Freada Bergeron, MD,as directed by  Freada Bergeron, MD while in the presence of Freada Bergeron, MD.  I, Freada Bergeron, MD, have reviewed all documentation for this visit. The documentation on 09/20/22 for the exam, diagnosis, procedures, and orders are all accurate and complete.   Signed, Freada Bergeron, MD  09/20/2022 9:30 AM    [place]

## 2022-09-22 ENCOUNTER — Encounter (HOSPITAL_COMMUNITY)
Admission: RE | Admit: 2022-09-22 | Discharge: 2022-09-22 | Disposition: A | Discharge status: Home / Self Care | Payer: PPO | Source: Ambulatory Visit | Attending: Cardiology | Admitting: Cardiology

## 2022-09-22 DIAGNOSIS — Z48812 Encounter for surgical aftercare following surgery on the circulatory system: Secondary | ICD-10-CM | POA: Insufficient documentation

## 2022-09-22 DIAGNOSIS — Z955 Presence of coronary angioplasty implant and graft: Secondary | ICD-10-CM | POA: Diagnosis not present

## 2022-09-25 ENCOUNTER — Encounter (HOSPITAL_COMMUNITY)
Admission: RE | Admit: 2022-09-25 | Discharge: 2022-09-25 | Disposition: A | Discharge status: Home / Self Care | Payer: PPO | Source: Ambulatory Visit | Attending: Cardiology | Admitting: Cardiology

## 2022-09-25 DIAGNOSIS — Z955 Presence of coronary angioplasty implant and graft: Secondary | ICD-10-CM

## 2022-09-26 NOTE — Progress Notes (Signed)
Cardiac Individual Treatment Plan  Patient Details  Name: Mark Fitzgerald MRN: [id number] Date of Birth: [birth year] Referring Provider:   Flowsheet Row INTENSIVE CARDIAC REHAB ORIENT from 09/05/2022 in [facility] Heart, Vascular, & [place]  Referring Provider Gwyndolyn Kaufman, MD       Initial Encounter Date:  [place] from 09/05/2022 in [facility], Vascular, & [facility]  Date 09/05/22       Visit Diagnosis: 06/05/22 S/P DES Prox/Mid LAD Intravascular Lithotripsy  Patient's Home Medications on Admission:  Current Outpatient Medications:    aspirin EC 81 MG tablet, Take 1 tablet (81 mg total) by mouth daily. Swallow whole., Disp: 90 tablet, Rfl: 3   cyanocobalamin (VITAMIN B12) 500 MCG tablet, Take 500 mcg by mouth daily., Disp: , Rfl:    Multiple Vitamins-Minerals (MULTIVITAMIN WITH MINERALS) tablet, Take 1 tablet by mouth daily., Disp: , Rfl:    nitroGLYCERIN (NITROSTAT) 0.4 MG SL tablet, Place 1 tablet (0.4 mg total) under the tongue every 5 (five) minutes as needed for chest pain. For up to 3 doses, Disp: 25 tablet, Rfl: 12   ranolazine (RANEXA) 500 MG 12 hr tablet, Take 1 tablet (500 mg total) by mouth 2 (two) times daily., Disp: 180 tablet, Rfl: 3   rosuvastatin (CRESTOR) 40 MG tablet, Take 1 tablet (40 mg total) by mouth daily., Disp: 90 tablet, Rfl: 3   tamsulosin (FLOMAX) 0.4 MG CAPS capsule, Take 0.4 mg by mouth daily., Disp: , Rfl:    ticagrelor (BRILINTA) 90 MG TABS tablet, Take 1 tablet (90 mg total) by mouth 2 (two) times daily., Disp: 180 tablet, Rfl: 3  Past Medical History: Past Medical History:  Diagnosis Date   CAD (coronary artery disease)    s/p LAD stent 05/2022   Healthy adult on routine physical examination    Hyperlipidemia LDL goal <70     Tobacco Use: Social History   Tobacco Use  Smoking Status Never  Smokeless Tobacco Never     Labs: Review Flowsheet       Latest Ref Rng & Units 06/03/2022 07/26/2022  Labs for ITP Cardiac and Pulmonary Rehab  Cholestrol 100 - 199 mg/dL 171  116   LDL (calc) 0 - 99 mg/dL 103  46   HDL-C >39 mg/dL 53  56   Trlycerides 0 - 149 mg/dL 74  66     Capillary Blood Glucose: No results found for: "GLUCAP"   Exercise Target Goals: Exercise Program Goal: Individual exercise prescription set using results from initial 6 min walk test and THRR while considering  patient's activity barriers and safety.   Exercise Prescription Goal: Initial exercise prescription builds to 30-45 minutes a day of aerobic activity, 2-3 days per week.  Home exercise guidelines will be given to patient during program as part of exercise prescription that the participant will acknowledge.  Activity Barriers & Risk Stratification:  Activity Barriers & Cardiac Risk Stratification - 09/05/22 1506       Activity Barriers & Cardiac Risk Stratification   Activity Barriers Back Problems;Neck/Spine Problems;Joint Problems;Chest Pain/Angina;Balance Concerns    Cardiac Risk Stratification High             6 Minute Walk:  6 Minute Walk     Row Name 09/05/22 1153         6 Minute Walk   Phase Initial     Distance 1884 feet     Walk Time  6 minutes     # of Rest Breaks 0     MPH 3.57     METS 4.18     RPE 7     Perceived Dyspnea  0     VO2 Peak 14.6     Symptoms No     Resting HR 68 bpm     Resting BP 104/70     Resting Oxygen Saturation  98 %     Exercise Oxygen Saturation  during 6 min walk 100 %     Max Ex. HR 87 bpm     Max Ex. BP 128/70     2 Minute Post BP 108/64              Oxygen Initial Assessment:   Oxygen Re-Evaluation:   Oxygen Discharge (Final Oxygen Re-Evaluation):   Initial Exercise Prescription:  Initial Exercise Prescription - 09/05/22 1500       Date of Initial Exercise RX and Referring Provider   Date 09/05/22    Referring Provider Gwyndolyn Kaufman,  MD    Expected Discharge Date 11/03/22      Arm/Foot Ergometer   Level 2    Watts 50    Minutes 15    METs 4.1      Biostep-RELP   Level 2    SPM 60    Minutes 15    METs 4.1      Prescription Details   Frequency (times per week) 3    Duration Progress to 30 minutes of continuous aerobic without signs/symptoms of physical distress      Intensity   THRR 40-80% of Max Heartrate 61-122    Ratings of Perceived Exertion 11-13    Perceived Dyspnea 0-4      Progression   Progression Continue progressive overload as per policy without signs/symptoms or physical distress.      Resistance Training   Training Prescription Yes    Weight 4 lbs    Reps 10-15             Perform Capillary Blood Glucose checks as needed.  Exercise Prescription Changes:   Exercise Prescription Changes     Row Name 09/11/22 1400 09/22/22 1400           Response to Exercise   Blood Pressure (Admit) 99/63 98/70      Blood Pressure (Exercise) 122/80 118/72      Blood Pressure (Exit) 102/72 104/72      Heart Rate (Admit) 80 bpm 72 bpm      Heart Rate (Exercise) 115 bpm 108 bpm      Heart Rate (Exit) 80 bpm 71 bpm      Rating of Perceived Exertion (Exercise) 12 11      Symptoms None None      Comments Pt's first day in the CRP2 program Reviewed METs      Duration Continue with 30 min of aerobic exercise without signs/symptoms of physical distress. Continue with 30 min of aerobic exercise without signs/symptoms of physical distress.      Intensity THRR unchanged THRR unchanged        Progression   Progression Continue to progress workloads to maintain intensity without signs/symptoms of physical distress. Continue to progress workloads to maintain intensity without signs/symptoms of physical distress.      Average METs 3 3.9        Resistance Training   Training Prescription Yes Yes      Weight 4 lbs 4 lbs  Reps 10-15 10-15      Time 10 Minutes 10 Minutes        Interval Training    Interval Training No No        Arm Ergometer   Level -- 2.5      Minutes -- 15      METs -- 3.1        Arm/Foot Ergometer   Level 2 --      Watts 60 --      Minutes 15 --        Biostep-RELP   Level 2 3      SPM 50 --      Minutes 15 15      METs 3 4.7               Exercise Comments:   Exercise Comments     Row Name 09/22/22 1434           Exercise Comments Reviewed METs. Pt increased workload on the Octane recumbent stepper. Will increase on Arm ergometer next session                Exercise Goals and Review:   Exercise Goals     Row Name 09/05/22 1507             Exercise Goals   Increase Physical Activity Yes       Intervention Provide advice, education, support and counseling about physical activity/exercise needs.;Develop an individualized exercise prescription for aerobic and resistive training based on initial evaluation findings, risk stratification, comorbidities and participant's personal goals.       Expected Outcomes Short Term: Attend rehab on a regular basis to increase amount of physical activity.;Long Term: Add in home exercise to make exercise part of routine and to increase amount of physical activity.;Long Term: Exercising regularly at least 3-5 days a week.       Increase Strength and Stamina Yes       Intervention Provide advice, education, support and counseling about physical activity/exercise needs.;Develop an individualized exercise prescription for aerobic and resistive training based on initial evaluation findings, risk stratification, comorbidities and participant's personal goals.       Expected Outcomes Short Term: Increase workloads from initial exercise prescription for resistance, speed, and METs.;Short Term: Perform resistance training exercises routinely during rehab and add in resistance training at home;Long Term: Improve cardiorespiratory fitness, muscular endurance and strength as measured by increased METs and  functional capacity (6MWT)       Able to understand and use rate of perceived exertion (RPE) scale Yes       Intervention Provide education and explanation on how to use RPE scale       Expected Outcomes Short Term: Able to use RPE daily in rehab to express subjective intensity level;Long Term:  Able to use RPE to guide intensity level when exercising independently       Knowledge and understanding of Target Heart Rate Range (THRR) Yes       Intervention Provide education and explanation of THRR including how the numbers were predicted and where they are located for reference       Expected Outcomes Short Term: Able to use daily as guideline for intensity in rehab;Short Term: Able to state/look up THRR;Long Term: Able to use THRR to govern intensity when exercising independently       Understanding of Exercise Prescription Yes       Intervention Provide education, explanation, and written materials on patient's individual  exercise prescription       Expected Outcomes Short Term: Able to explain program exercise prescription;Long Term: Able to explain home exercise prescription to exercise independently                Exercise Goals Re-Evaluation :  Exercise Goals Re-Evaluation     Mark Fitzgerald Name 09/11/22 1434             Exercise Goal Re-Evaluation   Exercise Goals Review Increase Physical Activity;Able to understand and use rate of perceived exertion (RPE) scale;Increase Strength and Stamina;Knowledge and understanding of Target Heart Rate Range (THRR);Understanding of Exercise Prescription       Comments Pt's first day in the CRP2 program. Pt understands the exercise Rx, RPE scale, THRR.       Expected Outcomes Will continue to monitor patient and progresss exercise workloads as tolerated.                Discharge Exercise Prescription (Final Exercise Prescription Changes):  Exercise Prescription Changes - 09/22/22 1400       Response to Exercise   Blood Pressure (Admit) 98/70     Blood Pressure (Exercise) 118/72    Blood Pressure (Exit) 104/72    Heart Rate (Admit) 72 bpm    Heart Rate (Exercise) 108 bpm    Heart Rate (Exit) 71 bpm    Rating of Perceived Exertion (Exercise) 11    Symptoms None    Comments Reviewed METs    Duration Continue with 30 min of aerobic exercise without signs/symptoms of physical distress.    Intensity THRR unchanged      Progression   Progression Continue to progress workloads to maintain intensity without signs/symptoms of physical distress.    Average METs 3.9      Resistance Training   Training Prescription Yes    Weight 4 lbs    Reps 10-15    Time 10 Minutes      Interval Training   Interval Training No      Arm Ergometer   Level 2.5    Minutes 15    METs 3.1      Biostep-RELP   Level 3    Minutes 15    METs 4.7             Nutrition:  Target Goals: Understanding of nutrition guidelines, daily intake of sodium '1500mg'$ , cholesterol '200mg'$ , calories 30% from fat and 7% or less from saturated fats, daily to have 5 or more servings of fruits and vegetables.  Biometrics:  Pre Biometrics - 09/05/22 1015       Pre Biometrics   Waist Circumference 37.25 inches    Hip Circumference 39.5 inches    Waist to Hip Ratio 0.94 %    Triceps Skinfold 7 mm    % Body Fat 21.6 %    Grip Strength 36 kg    Flexibility 0 in   cannot reach   Single Leg Stand 4 seconds              Nutrition Therapy Plan and Nutrition Goals:  Nutrition Therapy & Goals - 09/11/22 1539       Nutrition Therapy   Diet Heart Healthy Diet    Drug/Food Interactions Statins/Certain Fruits      Personal Nutrition Goals   Nutrition Goal Patient to increase strategies for managing cardiovascular risk by attending the [facility] education and nutrition courses    Personal Goal #2 Patient to use the plate method as guide for meal planning  to include lean protein/plant protein, vegetables, fruit, whole grains, and nonfat dairy as part of a  well balanced diet    Personal Goal #3 Patient to identify food sources and limit daily intake of saturated fat, trans fat, sodium, and refined carbohydrates    Comments Mark Fitzgerald reports motivation to make lifetsyle changes to aid with heart health. He is motivated to learn more about appropriate fat intake/sources and reading food labels. He reports normal appetite and eating three meals per day      Intervention Plan   Intervention Prescribe, educate and counsel regarding individualized specific dietary modifications aiming towards targeted core components such as weight, hypertension, lipid management, diabetes, heart failure and other comorbidities.;Nutrition handout(s) given to patient.    Expected Outcomes Short Term Goal: Understand basic principles of dietary content, such as calories, fat, sodium, cholesterol and nutrients.;Long Term Goal: Adherence to prescribed nutrition plan.             Nutrition Assessments:  Nutrition Assessments - 09/06/22 0959       Rate Your Plate Scores   Pre Score 71            MEDIFICTS Score Key: ?70 Need to make dietary changes  40-70 Heart Healthy Diet ? 40 Therapeutic Level Cholesterol Diet   Flowsheet Row INTENSIVE CARDIAC REHAB ORIENT from 09/05/2022 in [facility] Heart, Vascular, & Lung Health  Picture Your Plate Total Score on Admission 71      Picture Your Plate Scores: <70 Unhealthy dietary pattern with much room for improvement. 41-50 Dietary pattern unlikely to meet recommendations for good health and room for improvement. 51-60 More healthful dietary pattern, with some room for improvement.  >60 Healthy dietary pattern, although there may be some specific behaviors that could be improved.    Nutrition Goals Re-Evaluation:  Nutrition Goals Re-Evaluation     Mark Fitzgerald Name 09/11/22 1539             Goals   Current Weight 168 lb 3.4 oz (76.3 kg)       Comment lipids WNL       Expected Outcome  Mark Fitzgerald reports motivation to make lifetsyle changes to aid with heart health and maintain weight. He is motivated to learn more about appropriate fat intake/sources and reading food labels; he will benefit from attending the [place] education series.  He reports normal appetite and eating three meals per day                Nutrition Goals Re-Evaluation:  Nutrition Goals Re-Evaluation     Mark Fitzgerald Name 09/11/22 1539             Goals   Current Weight 168 lb 3.4 oz (76.3 kg)       Comment lipids WNL       Expected Outcome Mark Fitzgerald reports motivation to make lifetsyle changes to aid with heart health and maintain weight. He is motivated to learn more about appropriate fat intake/sources and reading food labels; he will benefit from attending the [place] education series.  He reports normal appetite and eating three meals per day                Nutrition Goals Discharge (Final Nutrition Goals Re-Evaluation):  Nutrition Goals Re-Evaluation - 09/11/22 1539       Goals   Current Weight 168 lb 3.4 oz (76.3 kg)    Comment lipids WNL    Expected Outcome Mark Fitzgerald reports motivation to make lifetsyle changes to  aid with heart health and maintain weight. He is motivated to learn more about appropriate fat intake/sources and reading food labels; he will benefit from attending the [place] education series.  He reports normal appetite and eating three meals per day             Psychosocial: Target Goals: Acknowledge presence or absence of significant depression and/or stress, maximize coping skills, provide positive support system. Participant is able to verbalize types and ability to use techniques and skills needed for reducing stress and depression.  Initial Review & Psychosocial Screening:  Initial Psych Review & Screening - 09/05/22 1510       Initial Review   Current issues with None Identified      Family Dynamics   Good Support System? Yes   Mark Fitzgerald has his wife and friends who  live in the area for support     Barriers   Psychosocial barriers to participate in program The patient should benefit from training in stress management and relaxation.      Screening Interventions   Interventions Encouraged to exercise             Quality of Life Scores:  Quality of Life - 09/05/22 1523       Quality of Life   Select Quality of Life      Quality of Life Scores   Health/Function Pre 24.8 %    Socioeconomic Pre 28.31 %    Psych/Spiritual Pre 28.29 %    Family Pre 29.5 %    GLOBAL Pre 26.97 %            Scores of 19 and below usually indicate a poorer quality of life in these areas.  A difference of  2-3 points is a clinically meaningful difference.  A difference of 2-3 points in the total score of the Quality of Life Index has been associated with significant improvement in overall quality of life, self-image, physical symptoms, and general health in studies assessing change in quality of life.  PHQ-9: Review Flowsheet       09/11/2022 09/05/2022  Depression screen PHQ 2/9  Decreased Interest 0 0  Down, Depressed, Hopeless 0 0  PHQ - 2 Score 0 0   Interpretation of Total Score  Total Score Depression Severity:  1-4 = Minimal depression, 5-9 = Mild depression, 10-14 = Moderate depression, 15-19 = Moderately severe depression, 20-27 = Severe depression   Psychosocial Evaluation and Intervention:   Psychosocial Re-Evaluation:  Psychosocial Re-Evaluation     Mark Fitzgerald Name 09/11/22 1412 09/22/22 1612           Psychosocial Re-Evaluation   Current issues with None Identified None Identified      Interventions Encouraged to attend Cardiac Rehabilitation for the exercise Encouraged to attend Cardiac Rehabilitation for the exercise      Continue Psychosocial Services  No Follow up required No Follow up required               Psychosocial Discharge (Final Psychosocial Re-Evaluation):  Psychosocial Re-Evaluation - 09/22/22 1612        Psychosocial Re-Evaluation   Current issues with None Identified    Interventions Encouraged to attend Cardiac Rehabilitation for the exercise    Continue Psychosocial Services  No Follow up required             Vocational Rehabilitation: Provide vocational rehab assistance to qualifying candidates.   Vocational Rehab Evaluation & Intervention:  Vocational Rehab - 09/05/22 1510  Initial Vocational Rehab Evaluation & Intervention   Assessment shows need for Vocational Rehabilitation No   Mark Fitzgerald is currently working and does not need vocatinal rehab at this time.            Education: Education Goals: Education classes will be provided on a weekly basis, covering required topics. Participant will state understanding/return demonstration of topics presented.    Education     Row Name 09/11/22 1500     Education   Cardiac Education Topics [place]   Environmental consultant Exercise   Exercise Workshop Exercise Basics: Building Your Action Plan   Instruction Review Code 1- Verbalizes Understanding   Class Start Time 1404   Class Stop Time 1459   Class Time Calculation (min) 55 min    [place] Name 09/13/22 1000     Education   Cardiac Education Topics [employer]   Educator Dietitian   Weekly Topic Efficiency Cooking - Meals in a Snap   Instruction Review Code 1- Verbalizes Understanding   Class Start Time 0815   Class Stop Time 0856   Class Time Calculation (min) 41 min    [place] Name 09/18/22 1600     Education   Cardiac Education Topics Pritikin   Charity fundraiser Exercise Physiologist   Select Nutrition   Nutrition Nutrition Action Plan   Instruction Review Code 1- Verbalizes Understanding   Class Start Time 1400   Class Stop Time 1435   Class Time Calculation (min) 35 min    [place] Name 09/20/22 1500     Education   Cardiac  Education Topics Pritikin   Financial trader   Weekly Topic Fast and Healthy Breakfasts   Instruction Review Code 1- Verbalizes Understanding   Class Start Time 1400   Class Stop Time 1450   Class Time Calculation (min) 50 min    [place] Name 09/22/22 0900     Education   Cardiac Education Topics [employer]in   Tax inspector General Education   General Education Hypertension and Heart Disease   Instruction Review Code 1- Verbalizes Understanding   Class Start Time 0815   Class Stop Time 0900   Class Time Calculation (min) 45 min            Core Videos: Exercise    Move It!  Clinical staff conducted group or individual video education with verbal and written material and guidebook.  Patient learns the recommended Pritikin exercise program. Exercise with the goal of living a long, healthy life. Some of the health benefits of exercise include controlled diabetes, healthier blood pressure levels, improved cholesterol levels, improved heart and lung capacity, improved sleep, and better body composition. Everyone should speak with their doctor before starting or changing an exercise routine.  Biomechanical Limitations Clinical staff conducted group or individual video education with verbal and written material and guidebook.  Patient learns how biomechanical limitations can impact exercise and how we can mitigate and possibly overcome limitations to have an impactful and balanced exercise routine.  Body Composition Clinical staff conducted group or individual video education with verbal and written material and guidebook.  Patient learns that body composition (ratio of muscle mass to fat mass)  is a key component to assessing overall fitness, rather than body weight alone. Increased fat mass, especially visceral belly fat, can put [place] at increased risk for metabolic syndrome, type 2 diabetes, heart  disease, and even death. It is recommended to combine diet and exercise (cardiovascular and resistance training) to improve your body composition. Seek guidance from your physician and exercise physiologist before implementing an exercise routine.  Exercise Action Plan Clinical staff conducted group or individual video education with verbal and written material and guidebook.  Patient learns the recommended strategies to achieve and enjoy long-term exercise adherence, including variety, self-motivation, self-efficacy, and positive decision making. Benefits of exercise include fitness, good health, weight management, more energy, better sleep, less stress, and overall well-being.  Medical   Heart Disease Risk Reduction Clinical staff conducted group or individual video education with verbal and written material and guidebook.  Patient learns our heart is our most vital organ as it circulates oxygen, nutrients, white blood cells, and hormones throughout the entire body, and carries waste away. Data supports a plant-based eating plan like the Pritikin Program for its effectiveness in slowing progression of and reversing heart disease. The video provides a number of recommendations to address heart disease.   Metabolic Syndrome and Belly Fat  Clinical staff conducted group or individual video education with verbal and written material and guidebook.  Patient learns what metabolic syndrome is, how it leads to heart disease, and how one can reverse it and keep it from coming back. You have metabolic syndrome if you have 3 of the following 5 criteria: abdominal obesity, high blood pressure, high triglycerides, low HDL cholesterol, and high blood sugar.  Hypertension and Heart Disease Clinical staff conducted group or individual video education with verbal and written material and guidebook.  Patient learns that high blood pressure, or hypertension, is very common in the [place]. Hypertension is  largely due to excessive salt intake, but other important risk factors include being overweight, physical inactivity, drinking too much alcohol, smoking, and not eating enough potassium from fruits and vegetables. High blood pressure is a leading risk factor for heart attack, stroke, congestive heart failure, dementia, kidney failure, and premature death. Long-term effects of excessive salt intake include stiffening of the arteries and thickening of heart muscle and organ damage. Recommendations include ways to reduce hypertension and the risk of heart disease.  Diseases of Our Time - Focusing on Diabetes Clinical staff conducted group or individual video education with verbal and written material and guidebook.  Patient learns why the best way to stop diseases of our time is prevention, through food and other lifestyle changes. Medicine (such as prescription pills and surgeries) is often only a Band-Aid on the problem, not a long-term solution. Most common diseases of our time include obesity, type 2 diabetes, hypertension, heart disease, and cancer. The Pritikin Program is recommended and has been proven to help reduce, reverse, and/or prevent the damaging effects of metabolic syndrome.  Nutrition   Overview of the Pritikin Eating Plan  Clinical staff conducted group or individual video education with verbal and written material and guidebook.  Patient learns about the [place] for disease risk reduction. The [place] emphasizes a wide variety of unrefined, minimally-processed carbohydrates, like fruits, vegetables, whole grains, and legumes. Go, Caution, and Stop food choices are explained. Plant-based and lean animal proteins are emphasized. Rationale provided for low sodium intake for blood pressure control, low added sugars for blood sugar stabilization, and low added fats and  oils for coronary artery disease risk reduction and weight management.  Calorie Density  Clinical  staff conducted group or individual video education with verbal and written material and guidebook.  Patient learns about calorie density and how it impacts the Pritikin Eating Plan. Knowing the characteristics of the food you choose will help you decide whether those foods will lead to weight gain or weight loss, and whether you want to consume more or less of them. Weight loss is usually a side effect of the Pritikin Eating Plan because of its focus on low calorie-dense foods.  Label Reading  Clinical staff conducted group or individual video education with verbal and written material and guidebook.  Patient learns about the Pritikin recommended label reading guidelines and corresponding recommendations regarding calorie density, added sugars, sodium content, and whole grains.  Dining Out - Part 1  Clinical staff conducted group or individual video education with verbal and written material and guidebook.  Patient learns that restaurant meals can be sabotaging because they can be so high in calories, fat, sodium, and/or sugar. Patient learns recommended strategies on how to positively address this and avoid unhealthy pitfalls.  Facts on Fats  Clinical staff conducted group or individual video education with verbal and written material and guidebook.  Patient learns that lifestyle modifications can be just as effective, if not more so, as many medications for lowering your risk of heart disease. A Pritikin lifestyle can help to reduce your risk of inflammation and atherosclerosis (cholesterol build-up, or plaque, in the artery walls). Lifestyle interventions such as dietary choices and physical activity address the cause of atherosclerosis. A review of the types of fats and their impact on blood cholesterol levels, along with dietary recommendations to reduce fat intake is also included.  Nutrition Action Plan  Clinical staff conducted group or individual video education with verbal and written  material and guidebook.  Patient learns how to incorporate Pritikin recommendations into their lifestyle. Recommendations include planning and keeping personal health goals in mind as an important part of their success.  Healthy Mind-Set    Healthy Minds, Bodies, Hearts  Clinical staff conducted group or individual video education with verbal and written material and guidebook.  Patient learns how to identify when they are stressed. Video will discuss the impact of that stress, as well as the many benefits of stress management. Patient will also be introduced to stress management techniques. The way we think, act, and feel has an impact on our hearts.  How Our Thoughts Can Heal Our Hearts  Clinical staff conducted group or individual video education with verbal and written material and guidebook.  Patient learns that negative thoughts can cause depression and anxiety. This can result in negative lifestyle behavior and serious health problems. Cognitive behavioral therapy is an effective method to help control our thoughts in order to change and improve our emotional outlook.  Additional Videos:  Exercise    Improving Performance  Clinical staff conducted group or individual video education with verbal and written material and guidebook.  Patient learns to use a non-linear approach by alternating intensity levels and lengths of time spent exercising to help burn more calories and lose more body fat. Cardiovascular exercise helps improve heart health, metabolism, hormonal balance, blood sugar control, and recovery from fatigue. Resistance training improves strength, endurance, balance, coordination, reaction time, metabolism, and muscle mass. Flexibility exercise improves circulation, posture, and balance. Seek guidance from your physician and exercise physiologist before implementing an exercise routine and learn your capabilities  and proper form for all exercise.  Introduction to Yoga  Clinical  staff conducted group or individual video education with verbal and written material and guidebook.  Patient learns about yoga, a discipline of the coming together of mind, breath, and body. The benefits of yoga include improved flexibility, improved range of motion, better posture and core strength, increased lung function, weight loss, and positive self-image. Yoga's heart health benefits include lowered blood pressure, healthier heart rate, decreased cholesterol and triglyceride levels, improved immune function, and reduced stress. Seek guidance from your physician and exercise physiologist before implementing an exercise routine and learn your capabilities and proper form for all exercise.  Medical   Aging: Enhancing Your Quality of Life  Clinical staff conducted group or individual video education with verbal and written material and guidebook.  Patient learns key strategies and recommendations to stay in good physical health and enhance quality of life, such as prevention strategies, having an advocate, securing a [place], and keeping a list of medications and system for tracking them. It also discusses how to avoid risk for bone loss.  Biology of Weight Control  Clinical staff conducted group or individual video education with verbal and written material and guidebook.  Patient learns that weight gain occurs because we consume more calories than we burn (eating more, moving less). Even if your body weight is normal, you may have higher ratios of fat compared to muscle mass. Too much body fat puts you at increased risk for cardiovascular disease, heart attack, stroke, type 2 diabetes, and obesity-related cancers. In addition to exercise, following the [place] can help reduce your risk.  Decoding Lab Results  Clinical staff conducted group or individual video education with verbal and written material and guidebook.  Patient learns that lab test  reflects one measurement whose values change over time and are influenced by many factors, including medication, stress, sleep, exercise, food, hydration, pre-existing medical conditions, and more. It is recommended to use the knowledge from this video to become more involved with your lab results and evaluate your numbers to speak with your doctor.   Diseases of Our Time - Overview  Clinical staff conducted group or individual video education with verbal and written material and guidebook.  Patient learns that according to the CDC, 50% to 70% of chronic diseases (such as obesity, type 2 diabetes, elevated lipids, hypertension, and heart disease) are avoidable through lifestyle improvements including healthier food choices, listening to satiety cues, and increased physical activity.  Sleep Disorders Clinical staff conducted group or individual video education with verbal and written material and guidebook.  Patient learns how good quality and duration of sleep are important to overall health and well-being. Patient also learns about sleep disorders and how they impact health along with recommendations to address them, including discussing with a physician.  Nutrition  Dining Out - Part 2 Clinical staff conducted group or individual video education with verbal and written material and guidebook.  Patient learns how to plan ahead and communicate in order to maximize their dining experience in a healthy and nutritious manner. Included are recommended food choices based on the type of restaurant the patient is visiting.   Fueling a Best boy conducted group or individual video education with verbal and written material and guidebook.  There is a strong connection between our food choices and our health. Diseases like obesity and type 2 diabetes are very prevalent and are in large-part due  to lifestyle choices. The Pritikin Eating Plan provides plenty of food and hunger-curbing  satisfaction. It is easy to follow, affordable, and helps reduce health risks.  Menu Workshop  Clinical staff conducted group or individual video education with verbal and written material and guidebook.  Patient learns that restaurant meals can sabotage health goals because they are often packed with calories, fat, sodium, and sugar. Recommendations include strategies to plan ahead and to communicate with the manager, chef, or server to help order a healthier meal.  Planning Your Eating Strategy  Clinical staff conducted group or individual video education with verbal and written material and guidebook.  Patient learns about the [place] and its benefit of reducing the risk of disease. The [place] does not focus on calories. Instead, it emphasizes high-quality, nutrient-rich foods. By knowing the characteristics of the foods, we choose, we can determine their calorie density and make informed decisions.  Targeting Your Nutrition Priorities  Clinical staff conducted group or individual video education with verbal and written material and guidebook.  Patient learns that lifestyle habits have a tremendous impact on disease risk and progression. This video provides eating and physical activity recommendations based on your personal health goals, such as reducing LDL cholesterol, losing weight, preventing or controlling type 2 diabetes, and reducing high blood pressure.  Vitamins and Minerals  Clinical staff conducted group or individual video education with verbal and written material and guidebook.  Patient learns different ways to obtain key vitamins and minerals, including through a recommended healthy diet. It is important to discuss all supplements you take with your doctor.   Healthy Mind-Set    Smoking Cessation  Clinical staff conducted group or individual video education with verbal and written material and guidebook.  Patient learns that cigarette smoking and  tobacco addiction pose a serious health risk which affects millions of people. Stopping smoking will significantly reduce the risk of heart disease, lung disease, and many forms of cancer. Recommended strategies for quitting are covered, including working with your doctor to develop a successful plan.  Culinary   Becoming a Financial trader conducted group or individual video education with verbal and written material and guidebook.  Patient learns that cooking at home can be healthy, cost-effective, quick, and puts them in control. Keys to cooking healthy recipes will include looking at your recipe, assessing your equipment needs, planning ahead, making it simple, choosing cost-effective seasonal ingredients, and limiting the use of added fats, salts, and sugars.  Cooking - Breakfast and Snacks  Clinical staff conducted group or individual video education with verbal and written material and guidebook.  Patient learns how important breakfast is to satiety and nutrition through the entire day. Recommendations include key foods to eat during breakfast to help stabilize blood sugar levels and to prevent overeating at meals later in the day. Planning ahead is also a key component.  Cooking - Human resources officer conducted group or individual video education with verbal and written material and guidebook.  Patient learns eating strategies to improve overall health, including an approach to cook more at home. Recommendations include thinking of animal protein as a side on your plate rather than center stage and focusing instead on lower calorie dense options like vegetables, fruits, whole grains, and plant-based proteins, such as beans. Making sauces in large quantities to freeze for later and leaving the skin on your vegetables are also recommended to maximize your experience.  Cooking - Healthy Salads and  Dressing Clinical staff conducted group or individual video education with  verbal and written material and guidebook.  Patient learns that vegetables, fruits, whole grains, and legumes are the foundations of the [place]. Recommendations include how to incorporate each of these in flavorful and healthy salads, and how to create homemade salad dressings. Proper handling of ingredients is also covered. Cooking - Soups and [employer] - Soups and Desserts Clinical staff conducted group or individual video education with verbal and written material and guidebook.  Patient learns that Pritikin soups and desserts make for easy, nutritious, and delicious snacks and meal components that are low in sodium, fat, sugar, and calorie density, while high in vitamins, minerals, and filling fiber. Recommendations include simple and healthy ideas for soups and desserts.   Overview     The Pritikin Solution Program Overview Clinical staff conducted group or individual video education with verbal and written material and guidebook.  Patient learns that the results of the [place] Program have been documented in more than 100 articles published in peer-reviewed journals, and the benefits include reducing risk factors for (and, in some cases, even reversing) high cholesterol, high blood pressure, type 2 diabetes, obesity, and more! An overview of the three key pillars of the Pritikin Program will be covered: eating well, doing regular exercise, and having a healthy mind-set.  WORKSHOPS  Exercise: Exercise Basics: Building Your Action Plan Clinical staff led group instruction and group discussion with PowerPoint presentation and patient guidebook. To enhance the learning environment the use of posters, models and videos may be added. At the conclusion of this workshop, patients will comprehend the difference between physical activity and exercise, as well as the benefits of incorporating both, into their routine. Patients will understand the FITT (Frequency, Intensity, Time,  and Type) principle and how to use it to build an exercise action plan. In addition, safety concerns and other considerations for exercise and cardiac rehab will be addressed by the presenter. The purpose of this lesson is to promote a comprehensive and effective weekly exercise routine in order to improve patients' overall level of fitness.   Managing Heart Disease: Your Path to a Healthier Heart Clinical staff led group instruction and group discussion with PowerPoint presentation and patient guidebook. To enhance the learning environment the use of posters, models and videos may be added.At the conclusion of this workshop, patients will understand the anatomy and physiology of the heart. Additionally, they will understand how Pritikin's three pillars impact the risk factors, the progression, and the management of heart disease.  The purpose of this lesson is to provide a high-level overview of the heart, heart disease, and how the Pritikin lifestyle positively impacts risk factors.  Exercise Biomechanics Clinical staff led group instruction and group discussion with PowerPoint presentation and patient guidebook. To enhance the learning environment the use of posters, models and videos may be added. Patients will learn how the structural parts of their bodies function and how these functions impact their daily activities, movement, and exercise. Patients will learn how to promote a neutral spine, learn how to manage pain, and identify ways to improve their physical movement in order to promote healthy living. The purpose of this lesson is to expose patients to common physical limitations that impact physical activity. Participants will learn practical ways to adapt and manage aches and pains, and to minimize their effect on regular exercise. Patients will learn how to maintain good posture while sitting, walking, and lifting.  Balance Training and  Fall Prevention  Clinical staff led group  instruction and group discussion with PowerPoint presentation and patient guidebook. To enhance the learning environment the use of posters, models and videos may be added. At the conclusion of this workshop, patients will understand the importance of their sensorimotor skills (vision, proprioception, and the vestibular system) in maintaining their ability to balance as they age. Patients will apply a variety of balancing exercises that are appropriate for their current level of function. Patients will understand the common causes for poor balance, possible solutions to these problems, and ways to modify their physical environment in order to minimize their fall risk. The purpose of this lesson is to teach patients about the importance of maintaining balance as they age and ways to minimize their risk of falling.  WORKSHOPS   Nutrition:  Fueling a Scientist, research (physical sciences) led group instruction and group discussion with PowerPoint presentation and patient guidebook. To enhance the learning environment the use of posters, models and videos may be added. Patients will review the foundational principles of the [place] and understand what constitutes a serving size in each of the food groups. Patients will also learn Pritikin-friendly foods that are better choices when away from home and review make-ahead meal and snack options. Calorie density will be reviewed and applied to three nutrition priorities: weight maintenance, weight loss, and weight gain. The purpose of this lesson is to reinforce (in a group setting) the key concepts around what patients are recommended to eat and how to apply these guidelines when away from home by planning and selecting Pritikin-friendly options. Patients will understand how calorie density may be adjusted for different weight management goals.  Mindful Eating  Clinical staff led group instruction and group discussion with PowerPoint presentation and patient  guidebook. To enhance the learning environment the use of posters, models and videos may be added. Patients will briefly review the concepts of the [place] and the importance of low-calorie dense foods. The concept of mindful eating will be introduced as well as the importance of paying attention to internal hunger signals. Triggers for non-hunger eating and techniques for dealing with triggers will be explored. The purpose of this lesson is to provide patients with the opportunity to review the basic principles of the [place], discuss the value of eating mindfully and how to measure internal cues of hunger and fullness using the Hunger Scale. Patients will also discuss reasons for non-hunger eating and learn strategies to use for controlling emotional eating.  Targeting Your Nutrition Priorities Clinical staff led group instruction and group discussion with PowerPoint presentation and patient guidebook. To enhance the learning environment the use of posters, models and videos may be added. Patients will learn how to determine their genetic susceptibility to disease by reviewing their family history. Patients will gain insight into the importance of diet as part of an overall healthy lifestyle in mitigating the impact of genetics and other environmental insults. The purpose of this lesson is to provide patients with the opportunity to assess their personal nutrition priorities by looking at their family history, their own health history and current risk factors. Patients will also be able to discuss ways of prioritizing and modifying the [place] for their highest risk areas  Menu  Clinical staff led group instruction and group discussion with PowerPoint presentation and patient guidebook. To enhance the learning environment the use of posters, models and videos may be added. Using menus brought in from local restaurants,  or printed from [employer], patients will apply  the [place] dining out guidelines that were presented in the [employer] video. Patients will also be able to practice these guidelines in a variety of provided scenarios. The purpose of this lesson is to provide patients with the opportunity to practice hands-on learning of the [place] with actual menus and practice scenarios.  Label Reading Clinical staff led group instruction and group discussion with PowerPoint presentation and patient guidebook. To enhance the learning environment the use of posters, models and videos may be added. Patients will review and discuss the Pritikin label reading guidelines presented in Pritikin's Label Reading Educational series video. Using fool labels brought in from local grocery stores and markets, patients will apply the label reading guidelines and determine if the packaged food meet the Pritikin guidelines. The purpose of this lesson is to provide patients with the opportunity to review, discuss, and practice hands-on learning of the Pritikin Label Reading guidelines with actual packaged food labels. [place] Workshops are designed to teach patients ways to prepare quick, simple, and affordable recipes at home. The importance of nutrition's role in chronic disease risk reduction is reflected in its emphasis in the overall Pritikin program. By learning how to prepare essential core Pritikin Eating Plan recipes, patients will increase control over what they eat; be able to customize the flavor of foods without the use of added salt, sugar, or fat; and improve the quality of the food they consume. By learning a set of core recipes which are easily assembled, quickly prepared, and affordable, patients are more likely to prepare more healthy foods at home. These workshops focus on convenient breakfasts, simple entres, side dishes, and desserts which can be prepared with minimal effort and are  consistent with nutrition recommendations for cardiovascular risk reduction. Cooking [employer] are taught by a Engineer, materials (RD) who has been trained by the [employer]. The chef or RD has a clear understanding of the importance of minimizing - if not completely eliminating - added fat, sugar, and sodium in recipes. Throughout the series of [place] Workshop sessions, patients will learn about healthy ingredients and efficient methods of cooking to build confidence in their capability to prepare    Cooking School weekly topics:  Adding Flavor- Sodium-Free  Fast and Healthy Breakfasts  Powerhouse Plant-Based Proteins  Satisfying Salads and Dressings  Simple Sides and Sauces  International Cuisine-Spotlight on the [place]ones  Delicious Desserts  Savory Soups  Efficiency Cooking - Meals in a Snap  Tasty Appetizers and Snacks  Comforting Weekend Breakfasts  One-Pot Wonders   Fast Evening Meals  Easy [place] (Psychosocial): New Thoughts, New Behaviors Clinical staff led group instruction and group discussion with PowerPoint presentation and patient guidebook. To enhance the learning environment the use of posters, models and videos may be added. Patients will learn and practice techniques for developing effective health and lifestyle goals. Patients will be able to effectively apply the goal setting process learned to develop at least one new personal goal.  The purpose of this lesson is to expose patients to a new skill set of behavior modification techniques such as techniques setting SMART goals, overcoming barriers, and achieving new thoughts and new behaviors.  Managing Moods and Relationships Clinical staff led group instruction and group discussion with PowerPoint presentation and patient guidebook. To enhance the learning environment  the use of posters, models and videos may  be added. Patients will learn how emotional and chronic stress factors can impact their health and relationships. They will learn healthy ways to manage their moods and utilize positive coping mechanisms. In addition, ICR patients will learn ways to improve communication skills. The purpose of this lesson is to expose patients to ways of understanding how one's mood and health are intimately connected. Developing a healthy outlook can help build positive relationships and connections with others. Patients will understand the importance of utilizing effective communication skills that include actively listening and being heard. They will learn and understand the importance of the "4 Cs" and especially Connections in fostering of a Healthy Mind-Set.  Healthy Sleep for a Healthy Heart Clinical staff led group instruction and group discussion with PowerPoint presentation and patient guidebook. To enhance the learning environment the use of posters, models and videos may be added. At the conclusion of this workshop, patients will be able to demonstrate knowledge of the importance of sleep to overall health, well-being, and quality of life. They will understand the symptoms of, and treatments for, common sleep disorders. Patients will also be able to identify daytime and nighttime behaviors which impact sleep, and they will be able to apply these tools to help manage sleep-related challenges. The purpose of this lesson is to provide patients with a general overview of sleep and outline the importance of quality sleep. Patients will learn about a few of the most common sleep disorders. Patients will also be introduced to the concept of "sleep hygiene," and discover ways to self-manage certain sleeping problems through simple daily behavior changes. Finally, the workshop will motivate patients by clarifying the links between quality sleep and their goals of heart-healthy living.   Recognizing and Reducing  Stress Clinical staff led group instruction and group discussion with PowerPoint presentation and patient guidebook. To enhance the learning environment the use of posters, models and videos may be added. At the conclusion of this workshop, patients will be able to understand the types of stress reactions, differentiate between acute and chronic stress, and recognize the impact that chronic stress has on their health. They will also be able to apply different coping mechanisms, such as reframing negative self-talk. Patients will have the opportunity to practice a variety of stress management techniques, such as deep abdominal breathing, progressive muscle relaxation, and/or guided imagery.  The purpose of this lesson is to educate patients on the role of stress in their lives and to provide healthy techniques for coping with it.  Learning Barriers/Preferences:  Learning Barriers/Preferences - 09/05/22 1524       Learning Barriers/Preferences   Learning Barriers Sight   wears reading glasses   Learning Preferences Computer/Internet;Written Material             Education Topics:  Knowledge Questionnaire Score:  Knowledge Questionnaire Score - 09/05/22 1525       Knowledge Questionnaire Score   Pre Score 24/24             Core Components/Risk Factors/Patient Goals at Admission:  Personal Goals and Risk Factors at Admission - 09/05/22 1525       Core Components/Risk Factors/Patient Goals on Admission   Lipids Yes    Intervention Provide education and support for participant on nutrition & aerobic/resistive exercise along with prescribed medications to achieve LDL '70mg'$ , HDL >'40mg'$ .    Expected Outcomes Short Term: Participant states understanding of desired cholesterol values and is compliant with medications prescribed. Participant  is following exercise prescription and nutrition guidelines.;Long Term: Cholesterol controlled with medications as prescribed, with individualized  exercise RX and with personalized nutrition plan. Value goals: LDL < '70mg'$ , HDL > 40 mg.             Core Components/Risk Factors/Patient Goals Review:   Goals and Risk Factor Review     Row Name 09/22/22 1613             Core Components/Risk Factors/Patient Goals Review   Personal Goals Review Lipids       Review Kaileb has been doing well with exercise at intensive cardiac rehab and has not has not reported having any chest pain. Resting entry systolic BP in the 75'Z, otherewise BP's have been WNL, asymptomatic.       Expected Outcomes Friedrich will continue to participate in intensive cardiac rehab for exercise, nutrition and lifestyle modifications                Core Components/Risk Factors/Patient Goals at Discharge (Final Review):   Goals and Risk Factor Review - 09/22/22 1613       Core Components/Risk Factors/Patient Goals Review   Personal Goals Review Lipids    Review Trafton has been doing well with exercise at intensive cardiac rehab and has not has not reported having any chest pain. Resting entry systolic BP in the 02'H, otherewise BP's have been WNL, asymptomatic.    Expected Outcomes Tanvir will continue to participate in intensive cardiac rehab for exercise, nutrition and lifestyle modifications             ITP Comments:  ITP Comments     Row Name 09/05/22 1508 09/11/22 1409 09/22/22 1605       ITP Comments Dr Fransico Him MD, Medical Director. Intorduction to [facility]/ Intensive Cardiac rehab. Initial Orientation Packet Reviewed with the patient 30 Day ITP Review. Callin started intensive cardiac rehab on 09/11/22 and did well with exercise. 30 Day ITP Review. Tillman has good attendance and participation in phase 2 cardiac rehab. Kellan is off to a good start to exercise.              Comments: See ITP Comments

## 2022-09-27 ENCOUNTER — Encounter (HOSPITAL_COMMUNITY)
Admission: RE | Admit: 2022-09-27 | Discharge: 2022-09-27 | Disposition: A | Discharge status: Home / Self Care | Payer: PPO | Source: Ambulatory Visit | Attending: Cardiology | Admitting: Cardiology

## 2022-09-27 DIAGNOSIS — Z955 Presence of coronary angioplasty implant and graft: Secondary | ICD-10-CM | POA: Diagnosis not present

## 2022-09-29 ENCOUNTER — Encounter (HOSPITAL_COMMUNITY)
Admission: RE | Admit: 2022-09-29 | Discharge: 2022-09-29 | Disposition: A | Discharge status: Home / Self Care | Payer: PPO | Source: Ambulatory Visit | Attending: Cardiology | Admitting: Cardiology

## 2022-09-29 ENCOUNTER — Encounter (HOSPITAL_COMMUNITY): Payer: PPO

## 2022-09-29 DIAGNOSIS — Z955 Presence of coronary angioplasty implant and graft: Secondary | ICD-10-CM

## 2022-10-02 ENCOUNTER — Other Ambulatory Visit (HOSPITAL_COMMUNITY): Payer: Self-pay

## 2022-10-02 ENCOUNTER — Encounter (HOSPITAL_COMMUNITY)
Admission: RE | Admit: 2022-10-02 | Discharge: 2022-10-02 | Disposition: A | Discharge status: Home / Self Care | Payer: PPO | Source: Ambulatory Visit | Attending: Cardiology | Admitting: Cardiology

## 2022-10-02 ENCOUNTER — Other Ambulatory Visit: Payer: Self-pay

## 2022-10-02 DIAGNOSIS — Z955 Presence of coronary angioplasty implant and graft: Secondary | ICD-10-CM | POA: Diagnosis not present

## 2022-10-04 ENCOUNTER — Encounter (HOSPITAL_COMMUNITY)
Admission: RE | Admit: 2022-10-04 | Discharge: 2022-10-04 | Disposition: A | Discharge status: Home / Self Care | Payer: PPO | Source: Ambulatory Visit | Attending: Cardiology | Admitting: Cardiology

## 2022-10-04 DIAGNOSIS — Z955 Presence of coronary angioplasty implant and graft: Secondary | ICD-10-CM | POA: Diagnosis not present

## 2022-10-06 ENCOUNTER — Other Ambulatory Visit (HOSPITAL_COMMUNITY): Payer: Self-pay

## 2022-10-06 ENCOUNTER — Encounter (HOSPITAL_COMMUNITY)
Admission: RE | Admit: 2022-10-06 | Discharge: 2022-10-06 | Disposition: A | Discharge status: Home / Self Care | Payer: PPO | Source: Ambulatory Visit | Attending: Cardiology | Admitting: Cardiology

## 2022-10-06 DIAGNOSIS — Z955 Presence of coronary angioplasty implant and graft: Secondary | ICD-10-CM

## 2022-10-09 ENCOUNTER — Encounter (HOSPITAL_COMMUNITY): Payer: PPO

## 2022-10-09 ENCOUNTER — Encounter (HOSPITAL_COMMUNITY)
Admission: RE | Admit: 2022-10-09 | Discharge: 2022-10-09 | Disposition: A | Discharge status: Home / Self Care | Payer: PPO | Source: Ambulatory Visit | Attending: Cardiology | Admitting: Cardiology

## 2022-10-09 DIAGNOSIS — Z955 Presence of coronary angioplasty implant and graft: Secondary | ICD-10-CM | POA: Diagnosis not present

## 2022-10-11 ENCOUNTER — Encounter (HOSPITAL_COMMUNITY)
Admission: RE | Admit: 2022-10-11 | Discharge: 2022-10-11 | Disposition: A | Discharge status: Home / Self Care | Payer: PPO | Source: Ambulatory Visit | Attending: Cardiology | Admitting: Cardiology

## 2022-10-11 ENCOUNTER — Encounter (HOSPITAL_COMMUNITY): Payer: PPO

## 2022-10-11 DIAGNOSIS — Z955 Presence of coronary angioplasty implant and graft: Secondary | ICD-10-CM

## 2022-10-13 ENCOUNTER — Encounter (HOSPITAL_COMMUNITY)
Admission: RE | Admit: 2022-10-13 | Discharge: 2022-10-13 | Disposition: A | Discharge status: Home / Self Care | Payer: PPO | Source: Ambulatory Visit | Attending: Cardiology | Admitting: Cardiology

## 2022-10-13 DIAGNOSIS — Z955 Presence of coronary angioplasty implant and graft: Secondary | ICD-10-CM

## 2022-10-18 ENCOUNTER — Encounter (HOSPITAL_COMMUNITY)
Admission: RE | Admit: 2022-10-18 | Discharge: 2022-10-18 | Disposition: A | Discharge status: Home / Self Care | Payer: PPO | Source: Ambulatory Visit | Attending: Cardiology | Admitting: Cardiology

## 2022-10-18 DIAGNOSIS — Z955 Presence of coronary angioplasty implant and graft: Secondary | ICD-10-CM

## 2022-10-20 ENCOUNTER — Encounter (HOSPITAL_COMMUNITY)
Admission: RE | Admit: 2022-10-20 | Discharge: 2022-10-20 | Disposition: A | Discharge status: Home / Self Care | Payer: PPO | Source: Ambulatory Visit | Attending: Cardiology | Admitting: Cardiology

## 2022-10-20 DIAGNOSIS — Z955 Presence of coronary angioplasty implant and graft: Secondary | ICD-10-CM

## 2022-10-24 NOTE — Progress Notes (Signed)
Cardiac Individual Treatment Plan  Patient Details  Name: Mark Fitzgerald MRN: [id number] Date of Birth: 1956-04-05 Referring Provider:   Flowsheet Row INTENSIVE CARDIAC REHAB ORIENT from 09/05/2022 in [facility] for Heart, Vascular, & [place]  Referring Provider Gwyndolyn Kaufman, MD       Initial Encounter Date:  [place] from 09/05/2022 in [facility], Vascular, & [facility]  Date 09/05/22       Visit Diagnosis: 06/05/22 S/P DES Prox/Mid LAD Intravascular Lithotripsy  Patient's Home Medications on Admission:  Current Outpatient Medications:    aspirin EC 81 MG tablet, Take 1 tablet (81 mg total) by mouth daily. Swallow whole., Disp: 90 tablet, Rfl: 3   cyanocobalamin (VITAMIN B12) 500 MCG tablet, Take 500 mcg by mouth daily., Disp: , Rfl:    Multiple Vitamins-Minerals (MULTIVITAMIN WITH MINERALS) tablet, Take 1 tablet by mouth daily., Disp: , Rfl:    nitroGLYCERIN (NITROSTAT) 0.4 MG SL tablet, Place 1 tablet (0.4 mg total) under the tongue every 5 (five) minutes as needed for chest pain. For up to 3 doses, Disp: 25 tablet, Rfl: 12   ranolazine (RANEXA) 500 MG 12 hr tablet, Take 1 tablet (500 mg total) by mouth 2 (two) times daily., Disp: 180 tablet, Rfl: 3   rosuvastatin (CRESTOR) 40 MG tablet, Take 1 tablet (40 mg total) by mouth daily., Disp: 90 tablet, Rfl: 3   tamsulosin (FLOMAX) 0.4 MG CAPS capsule, Take 0.4 mg by mouth daily., Disp: , Rfl:    ticagrelor (BRILINTA) 90 MG TABS tablet, Take 1 tablet (90 mg total) by mouth 2 (two) times daily., Disp: 180 tablet, Rfl: 3  Past Medical History: Past Medical History:  Diagnosis Date   CAD (coronary artery disease)    s/p LAD stent 05/2022   Healthy adult on routine physical examination    Hyperlipidemia LDL goal <70     Tobacco Use: Social History   Tobacco Use  Smoking Status Never  Smokeless Tobacco Never     Labs: Review Flowsheet       Latest Ref Rng & Units 06/03/2022 07/26/2022  Labs for ITP Cardiac and Pulmonary Rehab  Cholestrol 100 - 199 mg/dL 171  116   LDL (calc) 0 - 99 mg/dL 103  46   HDL-C >39 mg/dL 53  56   Trlycerides 0 - 149 mg/dL 74  66     Capillary Blood Glucose: No results found for: "GLUCAP"   Exercise Target Goals: Exercise Program Goal: Individual exercise prescription set using results from initial 6 min walk test and THRR while considering  patient's activity barriers and safety.   Exercise Prescription Goal: Initial exercise prescription builds to 30-45 minutes a day of aerobic activity, 2-3 days per week.  Home exercise guidelines will be given to patient during program as part of exercise prescription that the participant will acknowledge.  Activity Barriers & Risk Stratification:  Activity Barriers & Cardiac Risk Stratification - 09/05/22 1506       Activity Barriers & Cardiac Risk Stratification   Activity Barriers Back Problems;Neck/Spine Problems;Joint Problems;Chest Pain/Angina;Balance Concerns    Cardiac Risk Stratification High             6 Minute Walk:  6 Minute Walk     Row Name 09/05/22 1153         6 Minute Walk   Phase Initial     Distance 1884 feet     Walk Time  6 minutes     # of Rest Breaks 0     MPH 3.57     METS 4.18     RPE 7     Perceived Dyspnea  0     VO2 Peak 14.6     Symptoms No     Resting HR 68 bpm     Resting BP 104/70     Resting Oxygen Saturation  98 %     Exercise Oxygen Saturation  during 6 min walk 100 %     Max Ex. HR 87 bpm     Max Ex. BP 128/70     2 Minute Post BP 108/64              Oxygen Initial Assessment:   Oxygen Re-Evaluation:   Oxygen Discharge (Final Oxygen Re-Evaluation):   Initial Exercise Prescription:  Initial Exercise Prescription - 09/05/22 1500       Date of Initial Exercise RX and Referring Provider   Date 09/05/22    Referring Provider Gwyndolyn Kaufman,  MD    Expected Discharge Date 11/03/22      Arm/Foot Ergometer   Level 2    Watts 50    Minutes 15    METs 4.1      Biostep-RELP   Level 2    SPM 60    Minutes 15    METs 4.1      Prescription Details   Frequency (times per week) 3    Duration Progress to 30 minutes of continuous aerobic without signs/symptoms of physical distress      Intensity   THRR 40-80% of Max Heartrate 61-122    Ratings of Perceived Exertion 11-13    Perceived Dyspnea 0-4      Progression   Progression Continue progressive overload as per policy without signs/symptoms or physical distress.      Resistance Training   Training Prescription Yes    Weight 4 lbs    Reps 10-15             Perform Capillary Blood Glucose checks as needed.  Exercise Prescription Changes:   Exercise Prescription Changes     Row Name 09/11/22 1400 09/22/22 1400 09/27/22 1400 10/06/22 1000 10/20/22 1200     Response to Exercise   Blood Pressure (Admit) 99/63 98/70 102/50 104/70 110/68   Blood Pressure (Exercise) 122/80 118/72 120/74 122/68 126/62   Blood Pressure (Exit) 102/72 104/72 1'02/64 92/58 92/59 '$   Heart Rate (Admit) 80 bpm 72 bpm 73 bpm 66 bpm 75 bpm   Heart Rate (Exercise) 115 bpm 108 bpm 91 bpm 116 bpm 100 bpm   Heart Rate (Exit) 80 bpm 71 bpm 74 bpm 75 bpm 84 bpm   Rating of Perceived Exertion (Exercise) '12 11 12 12 12   '$ Symptoms None None none None None   Comments Pt's first day in the CRP2 program Reviewed METs Reviewed home exercise RX Reviewed METs and goals Reviewed METs   Duration Continue with 30 min of aerobic exercise without signs/symptoms of physical distress. Continue with 30 min of aerobic exercise without signs/symptoms of physical distress. Continue with 30 min of aerobic exercise without signs/symptoms of physical distress. Continue with 30 min of aerobic exercise without signs/symptoms of physical distress. Continue with 30 min of aerobic exercise without signs/symptoms of physical  distress.   Intensity THRR unchanged THRR unchanged THRR unchanged THRR unchanged THRR unchanged     Progression   Progression Continue to progress workloads to maintain intensity  without signs/symptoms of physical distress. Continue to progress workloads to maintain intensity without signs/symptoms of physical distress. Continue to progress workloads to maintain intensity without signs/symptoms of physical distress. Continue to progress workloads to maintain intensity without signs/symptoms of physical distress. Continue to progress workloads to maintain intensity without signs/symptoms of physical distress.   Average METs 3 3.9 4.7 3.85 4.9     Resistance Training   Training Prescription Yes Yes No Yes Yes   Weight 4 lbs 4 lbs No weights on wednesdas 6 lbs 6 lbs   Reps 10-15 10-15 -- 10-15 10-15   Time 10 Minutes 10 Minutes -- 10 Minutes 10 Minutes     Interval Training   Interval Training No No No No No     Arm Ergometer   Level -- 2.'5 3 3 3   '$ Watts -- -- -- -- 29   Minutes -- '15 15 15 15   '$ METs -- 3.1 -- 3.2 3.8     Arm/Foot Ergometer   Level 2 -- -- -- --   Watts 60 -- -- -- --   Minutes 15 -- -- -- --     Biostep-RELP   Level '2 3 3 4 6   '$ SPM 50 -- -- -- --   Minutes '15 15 15 15 15   '$ METs 3 4.7 4.7 4.5 6     Home Exercise Plan   Plans to continue exercise at -- -- [employer] (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 Add 3 additional days to program exercise sessions. Add 3 additional days to program exercise sessions.   Initial Home Exercises Provided -- -- 09/27/22 09/27/22 09/27/22            Exercise Comments:   Exercise Comments     Row Name 09/22/22 1434 09/27/22 1430 10/06/22 1106 10/20/22 1225     Exercise Comments Reviewed METs. Pt increased workload on the Octane recumbent stepper. Will increase on Arm ergometer next session Reviewed home exercise Rx  today with patient. Pt is going to his gym and using the elliptical stepper for 30 minutes 2-3x/week for 30 minutes. He is also using the weights. Pt continue 2-3x/week at gym in addition to the CRP2 program. Pt verbalized understnading of the home exercise Rx and was provided a copy. Reviewed METs and goals. Will increase workloads on both modalites next week. Reviewed METs. Pt is progressing well. Increased workload on the Octane recumbent stepper.             Exercise Goals and Review:   Exercise Goals     Row Name 09/05/22 1507             Exercise Goals   Increase Physical Activity Yes       Intervention Provide advice, education, support and counseling about physical activity/exercise needs.;Develop an individualized exercise prescription for aerobic and resistive training based on initial evaluation findings, risk stratification, comorbidities and participant's personal goals.       Expected Outcomes Short Term: Attend rehab on a regular basis to increase amount of physical activity.;Long Term: Add in home exercise to make exercise part of routine and to increase amount of physical activity.;Long Term: Exercising regularly at least 3-5 days a week.       Increase Strength and Stamina Yes       Intervention Provide advice, education, support and counseling about physical activity/exercise needs.;Develop an individualized exercise prescription for  aerobic and resistive training based on initial evaluation findings, risk stratification, comorbidities and participant's personal goals.       Expected Outcomes Short Term: Increase workloads from initial exercise prescription for resistance, speed, and METs.;Short Term: Perform resistance training exercises routinely during rehab and add in resistance training at home;Long Term: Improve cardiorespiratory fitness, muscular endurance and strength as measured by increased METs and functional capacity (6MWT)       Able to understand and use rate  of perceived exertion (RPE) scale Yes       Intervention Provide education and explanation on how to use RPE scale       Expected Outcomes Short Term: Able to use RPE daily in rehab to express subjective intensity level;Long Term:  Able to use RPE to guide intensity level when exercising independently       Knowledge and understanding of Target Heart Rate Range (THRR) Yes       Intervention Provide education and explanation of THRR including how the numbers were predicted and where they are located for reference       Expected Outcomes Short Term: Able to use daily as guideline for intensity in rehab;Short Term: Able to state/look up THRR;Long Term: Able to use THRR to govern intensity when exercising independently       Understanding of Exercise Prescription Yes       Intervention Provide education, explanation, and written materials on patient's individual exercise prescription       Expected Outcomes Short Term: Able to explain program exercise prescription;Long Term: Able to explain home exercise prescription to exercise independently                Exercise Goals Re-Evaluation :  Exercise Goals Re-Evaluation     Row Name 09/11/22 1434 10/06/22 1102           Exercise Goal Re-Evaluation   Exercise Goals Review Increase Physical Activity;Able to understand and use rate of perceived exertion (RPE) scale;Increase Strength and Stamina;Knowledge and understanding of Target Heart Rate Range (THRR);Understanding of Exercise Prescription Increase Physical Activity;Able to understand and use rate of perceived exertion (RPE) scale;Increase Strength and Stamina;Knowledge and understanding of Target Heart Rate Range (THRR);Understanding of Exercise Prescription      Comments Pt's first day in the CRP2 program. Pt understands the exercise Rx, RPE scale, THRR. Reviewed METs and goals. Pt  had peak METs of 4.5 today. Pt voices that regards to his goals of increased strength and stamina, and increased  endurance, he is improving. He also voices improvement with his flexibility. Pt still continues to exercise at the gym and pt is exceeding the 150 minutes of exercise per week.      Expected Outcomes Will continue to monitor patient and progresss exercise workloads as tolerated. Will continue to monitor patient and progresss exercise workloads as tolerated.               Discharge Exercise Prescription (Final Exercise Prescription Changes):  Exercise Prescription Changes - 10/20/22 1200       Response to Exercise   Blood Pressure (Admit) 110/68    Blood Pressure (Exercise) 126/62    Blood Pressure (Exit) 92/59    Heart Rate (Admit) 75 bpm    Heart Rate (Exercise) 100 bpm    Heart Rate (Exit) 84 bpm    Rating of Perceived Exertion (Exercise) 12    Symptoms None    Comments Reviewed METs    Duration Continue with 30 min of aerobic exercise without signs/symptoms  of physical distress.    Intensity THRR unchanged      Progression   Progression Continue to progress workloads to maintain intensity without signs/symptoms of physical distress.    Average METs 4.9      Resistance Training   Training Prescription Yes    Weight 6 lbs    Reps 10-15    Time 10 Minutes      Interval Training   Interval Training No      Arm Ergometer   Level 3    Watts 29    Minutes 15    METs 3.8      Biostep-RELP   Level 6    Minutes 15    METs 6      Home Exercise Plan   Plans to continue exercise at [employer] (comment)    Frequency Add 3 additional days to program exercise sessions.    Initial Home Exercises Provided 09/27/22             Nutrition:  Target Goals: Understanding of nutrition guidelines, daily intake of sodium '1500mg'$ , cholesterol '200mg'$ , calories 30% from fat and 7% or less from saturated fats, daily to have 5 or more servings of fruits and vegetables.  Biometrics:  Pre Biometrics - 09/05/22 1015       Pre Biometrics   Waist Circumference 37.25 inches     Hip Circumference 39.5 inches    Waist to Hip Ratio 0.94 %    Triceps Skinfold 7 mm    % Body Fat 21.6 %    Grip Strength 36 kg    Flexibility 0 in   cannot reach   Single Leg Stand 4 seconds              Nutrition Therapy Plan and Nutrition Goals:  Nutrition Therapy & Goals - 10/11/22 0838       Nutrition Therapy   Diet Heart Healthy Diet    Drug/Food Interactions Statins/Certain Fruits      Personal Nutrition Goals   Nutrition Goal Patient to increase strategies for managing cardiovascular risk by attending the [facility] education and nutrition courses    Personal Goal #2 Patient to use the plate method as guide for meal planning to include lean protein/plant protein, vegetables, fruit, whole grains, and nonfat dairy as part of a well balanced diet    Personal Goal #3 Patient to identify food sources and limit daily intake of saturated fat, trans fat, sodium, and refined carbohydrates    Comments Goals in action. Ndrew continues to attend the [employer] series. He has started making many dietary changes including reduced sodium intake, reading food labels for sodium, and reduced frequency of eating out/high fat meals. He continues to prioritize high fiber intake from fruits, vegetables, and whole grains. His wife remains very supportive of lifestyle changes.      Intervention Plan   Intervention Prescribe, educate and counsel regarding individualized specific dietary modifications aiming towards targeted core components such as weight, hypertension, lipid management, diabetes, heart failure and other comorbidities.;Nutrition handout(s) given to patient.    Expected Outcomes Short Term Goal: Understand basic principles of dietary content, such as calories, fat, sodium, cholesterol and nutrients.;Long Term Goal: Adherence to prescribed nutrition plan.             Nutrition Assessments:  Nutrition Assessments - 09/06/22 0959       Rate Your Plate Scores   Pre  Score 71  MEDIFICTS Score Key: ?70 Need to make dietary changes  40-70 Heart Healthy Diet ? 40 Therapeutic Level Cholesterol Diet   Flowsheet Row INTENSIVE CARDIAC REHAB ORIENT from 09/05/2022 in [facility] for Heart, Vascular, & Lung Health  Picture Your Plate Total Score on Admission 71      Picture Your Plate Scores: <58 Unhealthy dietary pattern with much room for improvement. 41-50 Dietary pattern unlikely to meet recommendations for good health and room for improvement. 51-60 More healthful dietary pattern, with some room for improvement.  >60 Healthy dietary pattern, although there may be some specific behaviors that could be improved.    Nutrition Goals Re-Evaluation:  Nutrition Goals Re-Evaluation     [place] Name 09/11/22 1539 10/11/22 0838           Goals   Current Weight 168 lb 3.4 oz (76.3 kg) 166 lb 7.2 oz (75.5 kg)      Comment lipids WNL No new labs at this time; lipids WNL, LDL 46      Expected Outcome Derius reports motivation to make lifetsyle changes to aid with heart health and maintain weight. He is motivated to learn more about appropriate fat intake/sources and reading food labels; he will benefit from attending the [place] education series.  He reports normal appetite and eating three meals per day Goals in action. Noach continues to attend the [employer] series. He has started making many dietary changes including reduced sodium intake, reading food labels for sodium, and reduced frequency of eating out/high fat meals. He continues to prioritize high fiber intake from fruits, vegetables, and whole grains. His wife remains very supportive of lifestyle changes. Orlyn is down 2.4# since starting with our program; he continues to maintain a healthy/normal BMI (23.5). Zabdiel will continue to benefit from adherance to the [facility] nutrition plan and attendacance to cardiac rehabb to aid with LDL <70, HDL >40, and  improved diet quality.               Nutrition Goals Re-Evaluation:  Nutrition Goals Re-Evaluation     [place] Name 09/11/22 1539 10/11/22 0838           Goals   Current Weight 168 lb 3.4 oz (76.3 kg) 166 lb 7.2 oz (75.5 kg)      Comment lipids WNL No new labs at this time; lipids WNL, LDL 46      Expected Outcome Stein reports motivation to make lifetsyle changes to aid with heart health and maintain weight. He is motivated to learn more about appropriate fat intake/sources and reading food labels; he will benefit from attending the [place] education series.  He reports normal appetite and eating three meals per day Goals in action. [place] continues to attend the [employer] series. He has started making many dietary changes including reduced sodium intake, reading food labels for sodium, and reduced frequency of eating out/high fat meals. He continues to prioritize high fiber intake from fruits, vegetables, and whole grains. His wife remains very supportive of lifestyle changes. [place]ony is down 2.4# since starting with our program; he continues to maintain a healthy/normal BMI (23.5). Serge will continue to benefit from adherance to the [facility] nutrition plan and attendacance to cardiac rehabb to aid with LDL <70, HDL >40, and improved diet quality.               Nutrition Goals Discharge (Final Nutrition Goals Re-Evaluation):  Nutrition Goals Re-Evaluation - 10/11/22 0998  Goals   Current Weight 166 lb 7.2 oz (75.5 kg)    Comment No new labs at this time; lipids WNL, LDL 46    Expected Outcome Goals in action. [place] continues to attend the [employer] series. He has started making many dietary changes including reduced sodium intake, reading food labels for sodium, and reduced frequency of eating out/high fat meals. He continues to prioritize high fiber intake from fruits, vegetables, and whole grains. His wife remains very supportive of lifestyle  changes. Don[place]in is down 2.4# since starting with our program; he continues to maintain a healthy/normal BMI (23.5). Claudio will continue to benefit from adherance to the [facility] nutrition plan and attendacance to cardiac rehabb to aid with LDL <70, HDL >40, and improved diet quality.             Psychosocial: Target Goals: Acknowledge presence or absence of significant depression and/or stress, maximize coping skills, provide positive support system. Participant is able to verbalize types and ability to use techniques and skills needed for reducing stress and depression.  Initial Review & Psychosocial Screening:  Initial Psych Review & Screening - 09/05/22 1510       Initial Review   Current issues with None Identified      Family Dynamics   Good Support System? Yes   Ludger has his wife and friends who live in the area for support     Barriers   Psychosocial barriers to participate in program The patient should benefit from training in stress management and relaxation.      Screening Interventions   Interventions Encouraged to exercise             Quality of Life Scores:  Quality of Life - 09/05/22 1523       Quality of Life   Select Quality of Life      Quality of Life Scores   Health/Function Pre 24.8 %    Socioeconomic Pre 28.31 %    Psych/Spiritual Pre 28.29 %    Family Pre 29.5 %    GLOBAL Pre 26.97 %            Scores of 19 and below usually indicate a poorer quality of life in these areas.  A difference of  2-3 points is a clinically meaningful difference.  A difference of 2-3 points in the total score of the Quality of Life Index has been associated with significant improvement in overall quality of life, self-image, physical symptoms, and general health in studies assessing change in quality of life.  PHQ-9: Review Flowsheet       09/11/2022 09/05/2022  Depression screen PHQ 2/9  Decreased Interest 0 0  Down, Depressed, Hopeless 0 0  PHQ - 2  Score 0 0   Interpretation of Total Score  Total Score Depression Severity:  1-4 = Minimal depression, 5-9 = Mild depression, 10-14 = Moderate depression, 15-19 = Moderately severe depression, 20-27 = Severe depression   Psychosocial Evaluation and Intervention:   Psychosocial Re-Evaluation:  Psychosocial Re-Evaluation     [place] Name 09/11/22 1412 09/22/22 1612 10/24/22 1327         Psychosocial Re-Evaluation   Current issues with None Identified None Identified None Identified     Interventions Encouraged to attend Cardiac Rehabilitation for the exercise Encouraged to attend Cardiac Rehabilitation for the exercise Encouraged to attend Cardiac Rehabilitation for the exercise     Continue Psychosocial Services  No Follow up required No Follow up required No Follow  up required              Psychosocial Discharge (Final Psychosocial Re-Evaluation):  Psychosocial Re-Evaluation - 10/24/22 1327       Psychosocial Re-Evaluation   Current issues with None Identified    Interventions Encouraged to attend Cardiac Rehabilitation for the exercise    Continue Psychosocial Services  No Follow up required             Vocational Rehabilitation: Provide vocational rehab assistance to qualifying candidates.   Vocational Rehab Evaluation & Intervention:  Vocational Rehab - 09/05/22 1510       Initial Vocational Rehab Evaluation & Intervention   Assessment shows need for Vocational Rehabilitation No   Deontez is currently working and does not need vocatinal rehab at this time.            Education: Education Goals: Education classes will be provided on a weekly basis, covering required topics. Participant will state understanding/return demonstration of topics presented.    Education     Row Name 09/11/22 1500     Education   Cardiac Education Topics [place]   Environmental consultant Exercise   Exercise Workshop  Exercise Basics: Building Your Action Plan   Instruction Review Code 1- Verbalizes Understanding   Class Start Time 1404   Class Stop Time 1459   Class Time Calculation (min) 55 min    [place] Name 09/13/22 1000     Education   Cardiac Education Topics [employer]   Educator Dietitian   Weekly Topic Efficiency Cooking - Meals in a Snap   Instruction Review Code 1- Verbalizes Understanding   Class Start Time 0815   Class Stop Time 0856   Class Time Calculation (min) 41 min    [place] Name 09/18/22 1600     Education   Cardiac Education Topics [facility]   Charity fundraiser Exercise Physiologist   Select Nutrition   Nutrition Nutrition Action Plan   Instruction Review Code 1- Verbalizes Understanding   Class Start Time 1400   Class Stop Time 1435   Class Time Calculation (min) 35 min    [place] Name 09/20/22 1500     Education   Cardiac Education Topics [facility]   Financial trader   Weekly Topic Fast and Healthy Breakfasts   Instruction Review Code 1- Verbalizes Understanding   Class Start Time 1400   Class Stop Time 1450   Class Time Calculation (min) 50 min    [place] Name 09/22/22 0900     Education   Cardiac Education Topics [facility]   Tax inspector General Education   General Education Hypertension and Heart Disease   Instruction Review Code 1- Verbalizes Understanding   Class Start Time 0815   Class Stop Time 0900   Class Time Calculation (min) 45 min    [place] Name 09/25/22 1500     Education   Cardiac Education Topics Pritikin     Workshops   Dentist Psychosocial   Psychosocial Workshop Other  From Head to Heart: The Power of a Healthy Outlook   Instruction Review Code 1- Verbalizes Understanding   Class Start Time 1406   Class  Stop Time 1500   Class Time Calculation  (min) 54 min    Row Name 09/27/22 1000     Education   Cardiac Education Topics [employer]   Educator Dietitian   Weekly Topic One-Pot Wonders   Instruction Review Code 1- Verbalizes Understanding   Class Start Time 0815   Class Stop Time 0900   Class Time Calculation (min) 45 min    Row Name 09/29/22 0900     Education   Cardiac Education Topics Pritikin   Select Core Videos     Core Videos   Educator Dietitian   Select Nutrition   Nutrition Dining Out - Part 1   Instruction Review Code 1- Verbalizes Understanding   Class Start Time 0815   Class Stop Time 0903   Class Time Calculation (min) 48 min    [place] Name 10/02/22 1400     Education   Cardiac Education Topics Pritikin   Academic librarian Exercise Education   Exercise Education Biomechanial Limitations   Instruction Review Code 1- Verbalizes Understanding   Class Start Time 1400   Class Stop Time 1443   Class Time Calculation (min) 43 min    [place] Name 10/04/22 1200     Education   Cardiac Education Topics [employer]   Educator Dietitian   Weekly Topic Fast Evening Meals   Instruction Review Code 1- Verbalizes Understanding   Class Start Time [phone]   Class Stop Time 0851   Class Time Calculation (min) 41 min    [place] Name 10/06/22 1500     Education   Cardiac Education Topics Pritikin   Charity fundraiser Exercise Physiologist   Select Psychosocial   Psychosocial How Our Thoughts Can Heal Our Hearts   Instruction Review Code 1- Verbalizes Understanding   Class Start Time 0805   Class Stop Time 0850   Class Time Calculation (min) 45 min    [place] Name 10/09/22 0900     Education   Cardiac Education Topics Pritikin   IT sales professional Nutrition   Nutrition Workshop Fueling a Tourist information centre manager   Instruction Review Code 1- Verbalizes Understanding   Class Start Time 0815   Class Stop Time 0901   Class Time Calculation (min) 46 min    [place] Name 10/11/22 1200     Education   Cardiac Education Topics Pritikin   Financial trader   Weekly Topic International Cuisine- Spotlight on the [place]   Instruction Review Code 1- Verbalizes Understanding   Class Start Time 0815   Class Stop Time [phone]   Class Time Calculation (min) 37 min    [place] Name 10/13/22 0900     Education   Cardiac Education Topics Pritikin   Select Workshops     Workshops   Educator Exercise Physiologist   Select Psychosocial   Psychosocial Workshop Recognizing and Reducing Stress   Instruction Review Code 1- Verbalizes Understanding   Class Start Time [phone]   Class Stop Time 0900   Class Time Calculation (min) 50 min    [place] Name 10/18/22 1300     Education   Cardiac  Education Topics Pritikin   Charity fundraiser Exercise Physiologist   Select Nutrition   Nutrition Cooking - Healthy Salads and Dressing   Instruction Review Code 1- Verbalizes Understanding   Class Start Time 0803   Class Stop Time [phone]   Class Time Calculation (min) 39 min    [place] Name 10/20/22 0900     Education   Cardiac Education Topics Pritikin   Select Core Videos     Core Videos   Educator Exercise Physiologist   Select General Education   General Education Heart Disease Risk Reduction   Instruction Review Code 1- Verbalizes Understanding   Class Start Time [phone]   Class Stop Time 0855   Class Time Calculation (min) 45 min            Core Videos: Exercise    Move It!  Clinical staff conducted group or individual video education with verbal and written material and guidebook.  Patient learns the recommended Pritikin exercise program. Exercise with the goal of living a long, healthy life. Some of the health benefits of exercise  include controlled diabetes, healthier blood pressure levels, improved cholesterol levels, improved heart and lung capacity, improved sleep, and better body composition. Everyone should speak with their doctor before starting or changing an exercise routine.  Biomechanical Limitations Clinical staff conducted group or individual video education with verbal and written material and guidebook.  Patient learns how biomechanical limitations can impact exercise and how we can mitigate and possibly overcome limitations to have an impactful and balanced exercise routine.  Body Composition Clinical staff conducted group or individual video education with verbal and written material and guidebook.  Patient learns that body composition (ratio of muscle mass to fat mass) is a key component to assessing overall fitness, rather than body weight alone. Increased fat mass, especially visceral belly fat, can put [place] at increased risk for metabolic syndrome, type 2 diabetes, heart disease, and even death. It is recommended to combine diet and exercise (cardiovascular and resistance training) to improve your body composition. Seek guidance from your physician and exercise physiologist before implementing an exercise routine.  Exercise Action Plan Clinical staff conducted group or individual video education with verbal and written material and guidebook.  Patient learns the recommended strategies to achieve and enjoy long-term exercise adherence, including variety, self-motivation, self-efficacy, and positive decision making. Benefits of exercise include fitness, good health, weight management, more energy, better sleep, less stress, and overall well-being.  Medical   Heart Disease Risk Reduction Clinical staff conducted group or individual video education with verbal and written material and guidebook.  Patient learns our heart is our most vital organ as it circulates oxygen, nutrients, white blood cells, and hormones  throughout the entire body, and carries waste away. Data supports a plant-based eating plan like the Pritikin Program for its effectiveness in slowing progression of and reversing heart disease. The video provides a number of recommendations to address heart disease.   Metabolic Syndrome and Belly Fat  Clinical staff conducted group or individual video education with verbal and written material and guidebook.  Patient learns what metabolic syndrome is, how it leads to heart disease, and how one can reverse it and keep it from coming back. You have metabolic syndrome if you have 3 of the following 5 criteria: abdominal obesity, high blood pressure, high triglycerides, low HDL cholesterol, and high blood sugar.  Hypertension and Heart Disease Clinical staff conducted group or individual  video education with verbal and written material and guidebook.  Patient learns that high blood pressure, or hypertension, is very common in the [place]. Hypertension is largely due to excessive salt intake, but other important risk factors include being overweight, physical inactivity, drinking too much alcohol, smoking, and not eating enough potassium from fruits and vegetables. High blood pressure is a leading risk factor for heart attack, stroke, congestive heart failure, dementia, kidney failure, and premature death. Long-term effects of excessive salt intake include stiffening of the arteries and thickening of heart muscle and organ damage. Recommendations include ways to reduce hypertension and the risk of heart disease.  Diseases of Our Time - Focusing on Diabetes Clinical staff conducted group or individual video education with verbal and written material and guidebook.  Patient learns why the best way to stop diseases of our time is prevention, through food and other lifestyle changes. Medicine (such as prescription pills and surgeries) is often only a Band-Aid on the problem, not a long-term solution. Most  common diseases of our time include obesity, type 2 diabetes, hypertension, heart disease, and cancer. The Pritikin Program is recommended and has been proven to help reduce, reverse, and/or prevent the damaging effects of metabolic syndrome.  Nutrition   Overview of the Pritikin Eating Plan  Clinical staff conducted group or individual video education with verbal and written material and guidebook.  Patient learns about the [place] for disease risk reduction. The [place] emphasizes a wide variety of unrefined, minimally-processed carbohydrates, like fruits, vegetables, whole grains, and legumes. Go, Caution, and Stop food choices are explained. Plant-based and lean animal proteins are emphasized. Rationale provided for low sodium intake for blood pressure control, low added sugars for blood sugar stabilization, and low added fats and oils for coronary artery disease risk reduction and weight management.  Calorie Density  Clinical staff conducted group or individual video education with verbal and written material and guidebook.  Patient learns about calorie density and how it impacts the Pritikin Eating Plan. Knowing the characteristics of the food you choose will help you decide whether those foods will lead to weight gain or weight loss, and whether you want to consume more or less of them. Weight loss is usually a side effect of the Pritikin Eating Plan because of its focus on low calorie-dense foods.  Label Reading  Clinical staff conducted group or individual video education with verbal and written material and guidebook.  Patient learns about the Pritikin recommended label reading guidelines and corresponding recommendations regarding calorie density, added sugars, sodium content, and whole grains.  Dining Out - Part 1  Clinical staff conducted group or individual video education with verbal and written material and guidebook.  Patient learns that restaurant meals  can be sabotaging because they can be so high in calories, fat, sodium, and/or sugar. Patient learns recommended strategies on how to positively address this and avoid unhealthy pitfalls.  Facts on Fats  Clinical staff conducted group or individual video education with verbal and written material and guidebook.  Patient learns that lifestyle modifications can be just as effective, if not more so, as many medications for lowering your risk of heart disease. A Pritikin lifestyle can help to reduce your risk of inflammation and atherosclerosis (cholesterol build-up, or plaque, in the artery walls). Lifestyle interventions such as dietary choices and physical activity address the cause of atherosclerosis. A review of the types of fats and their impact on blood cholesterol levels, along with dietary recommendations  to reduce fat intake is also included.  Nutrition Action Plan  Clinical staff conducted group or individual video education with verbal and written material and guidebook.  Patient learns how to incorporate Pritikin recommendations into their lifestyle. Recommendations include planning and keeping personal health goals in mind as an important part of their success.  Healthy Mind-Set    Healthy Minds, Bodies, Hearts  Clinical staff conducted group or individual video education with verbal and written material and guidebook.  Patient learns how to identify when they are stressed. Video will discuss the impact of that stress, as well as the many benefits of stress management. Patient will also be introduced to stress management techniques. The way we think, act, and feel has an impact on our hearts.  How Our Thoughts Can Heal Our Hearts  Clinical staff conducted group or individual video education with verbal and written material and guidebook.  Patient learns that negative thoughts can cause depression and anxiety. This can result in negative lifestyle behavior and serious health problems.  Cognitive behavioral therapy is an effective method to help control our thoughts in order to change and improve our emotional outlook.  Additional Videos:  Exercise    Improving Performance  Clinical staff conducted group or individual video education with verbal and written material and guidebook.  Patient learns to use a non-linear approach by alternating intensity levels and lengths of time spent exercising to help burn more calories and lose more body fat. Cardiovascular exercise helps improve heart health, metabolism, hormonal balance, blood sugar control, and recovery from fatigue. Resistance training improves strength, endurance, balance, coordination, reaction time, metabolism, and muscle mass. Flexibility exercise improves circulation, posture, and balance. Seek guidance from your physician and exercise physiologist before implementing an exercise routine and learn your capabilities and proper form for all exercise.  Introduction to Yoga  Clinical staff conducted group or individual video education with verbal and written material and guidebook.  Patient learns about yoga, a discipline of the coming together of mind, breath, and body. The benefits of yoga include improved flexibility, improved range of motion, better posture and core strength, increased lung function, weight loss, and positive self-image. Yoga's heart health benefits include lowered blood pressure, healthier heart rate, decreased cholesterol and triglyceride levels, improved immune function, and reduced stress. Seek guidance from your physician and exercise physiologist before implementing an exercise routine and learn your capabilities and proper form for all exercise.  Medical   Aging: Enhancing Your Quality of Life  Clinical staff conducted group or individual video education with verbal and written material and guidebook.  Patient learns key strategies and recommendations to stay in good physical health and enhance  quality of life, such as prevention strategies, having an advocate, securing a [place], and keeping a list of medications and system for tracking them. It also discusses how to avoid risk for bone loss.  Biology of Weight Control  Clinical staff conducted group or individual video education with verbal and written material and guidebook.  Patient learns that weight gain occurs because we consume more calories than we burn (eating more, moving less). Even if your body weight is normal, you may have higher ratios of fat compared to muscle mass. Too much body fat puts you at increased risk for cardiovascular disease, heart attack, stroke, type 2 diabetes, and obesity-related cancers. In addition to exercise, following the [place] can help reduce your risk.  Decoding Lab Results  Clinical staff conducted group  or individual video education with verbal and written material and guidebook.  Patient learns that lab test reflects one measurement whose values change over time and are influenced by many factors, including medication, stress, sleep, exercise, food, hydration, pre-existing medical conditions, and more. It is recommended to use the knowledge from this video to become more involved with your lab results and evaluate your numbers to speak with your doctor.   Diseases of Our Time - Overview  Clinical staff conducted group or individual video education with verbal and written material and guidebook.  Patient learns that according to the CDC, 50% to 70% of chronic diseases (such as obesity, type 2 diabetes, elevated lipids, hypertension, and heart disease) are avoidable through lifestyle improvements including healthier food choices, listening to satiety cues, and increased physical activity.  Sleep Disorders Clinical staff conducted group or individual video education with verbal and written material and guidebook.  Patient learns how good quality and  duration of sleep are important to overall health and well-being. Patient also learns about sleep disorders and how they impact health along with recommendations to address them, including discussing with a physician.  Nutrition  Dining Out - Part 2 Clinical staff conducted group or individual video education with verbal and written material and guidebook.  Patient learns how to plan ahead and communicate in order to maximize their dining experience in a healthy and nutritious manner. Included are recommended food choices based on the type of restaurant the patient is visiting.   Fueling a Best boy conducted group or individual video education with verbal and written material and guidebook.  There is a strong connection between our food choices and our health. Diseases like obesity and type 2 diabetes are very prevalent and are in large-part due to lifestyle choices. The Pritikin Eating Plan provides plenty of food and hunger-curbing satisfaction. It is easy to follow, affordable, and helps reduce health risks.  Menu Workshop  Clinical staff conducted group or individual video education with verbal and written material and guidebook.  Patient learns that restaurant meals can sabotage health goals because they are often packed with calories, fat, sodium, and sugar. Recommendations include strategies to plan ahead and to communicate with the manager, chef, or server to help order a healthier meal.  Planning Your Eating Strategy  Clinical staff conducted group or individual video education with verbal and written material and guidebook.  Patient learns about the [place] and its benefit of reducing the risk of disease. The [place] does not focus on calories. Instead, it emphasizes high-quality, nutrient-rich foods. By knowing the characteristics of the foods, we choose, we can determine their calorie density and make informed decisions.  Targeting Your  Nutrition Priorities  Clinical staff conducted group or individual video education with verbal and written material and guidebook.  Patient learns that lifestyle habits have a tremendous impact on disease risk and progression. This video provides eating and physical activity recommendations based on your personal health goals, such as reducing LDL cholesterol, losing weight, preventing or controlling type 2 diabetes, and reducing high blood pressure.  Vitamins and Minerals  Clinical staff conducted group or individual video education with verbal and written material and guidebook.  Patient learns different ways to obtain key vitamins and minerals, including through a recommended healthy diet. It is important to discuss all supplements you take with your doctor.   Healthy Mind-Set    Smoking Cessation  Clinical staff conducted group or individual video education with  verbal and written material and guidebook.  Patient learns that cigarette smoking and tobacco addiction pose a serious health risk which affects millions of people. Stopping smoking will significantly reduce the risk of heart disease, lung disease, and many forms of cancer. Recommended strategies for quitting are covered, including working with your doctor to develop a successful plan.  Culinary   Becoming a Financial trader conducted group or individual video education with verbal and written material and guidebook.  Patient learns that cooking at home can be healthy, cost-effective, quick, and puts them in control. Keys to cooking healthy recipes will include looking at your recipe, assessing your equipment needs, planning ahead, making it simple, choosing cost-effective seasonal ingredients, and limiting the use of added fats, salts, and sugars.  Cooking - Breakfast and Snacks  Clinical staff conducted group or individual video education with verbal and written material and guidebook.  Patient learns how important  breakfast is to satiety and nutrition through the entire day. Recommendations include key foods to eat during breakfast to help stabilize blood sugar levels and to prevent overeating at meals later in the day. Planning ahead is also a key component.  Cooking - Human resources officer conducted group or individual video education with verbal and written material and guidebook.  Patient learns eating strategies to improve overall health, including an approach to cook more at home. Recommendations include thinking of animal protein as a side on your plate rather than center stage and focusing instead on lower calorie dense options like vegetables, fruits, whole grains, and plant-based proteins, such as beans. Making sauces in large quantities to freeze for later and leaving the skin on your vegetables are also recommended to maximize your experience.  Cooking - Healthy Salads and Dressing Clinical staff conducted group or individual video education with verbal and written material and guidebook.  Patient learns that vegetables, fruits, whole grains, and legumes are the foundations of the [place]. Recommendations include how to incorporate each of these in flavorful and healthy salads, and how to create homemade salad dressings. Proper handling of ingredients is also covered. Cooking - Soups and [employer] - Soups and Desserts Clinical staff conducted group or individual video education with verbal and written material and guidebook.  Patient learns that Pritikin soups and desserts make for easy, nutritious, and delicious snacks and meal components that are low in sodium, fat, sugar, and calorie density, while high in vitamins, minerals, and filling fiber. Recommendations include simple and healthy ideas for soups and desserts.   Overview     The Pritikin Solution Program Overview Clinical staff conducted group or individual video education with verbal and written material  and guidebook.  Patient learns that the results of the [place] Program have been documented in more than 100 articles published in peer-reviewed journals, and the benefits include reducing risk factors for (and, in some cases, even reversing) high cholesterol, high blood pressure, type 2 diabetes, obesity, and more! An overview of the three key pillars of the Pritikin Program will be covered: eating well, doing regular exercise, and having a healthy mind-set.  WORKSHOPS  Exercise: Exercise Basics: Building Your Action Plan Clinical staff led group instruction and group discussion with PowerPoint presentation and patient guidebook. To enhance the learning environment the use of posters, models and videos may be added. At the conclusion of this workshop, patients will comprehend the difference between physical activity and exercise, as well as the benefits of incorporating both,  into their routine. Patients will understand the FITT (Frequency, Intensity, Time, and Type) principle and how to use it to build an exercise action plan. In addition, safety concerns and other considerations for exercise and cardiac rehab will be addressed by the presenter. The purpose of this lesson is to promote a comprehensive and effective weekly exercise routine in order to improve patients' overall level of fitness.   Managing Heart Disease: Your Path to a Healthier Heart Clinical staff led group instruction and group discussion with PowerPoint presentation and patient guidebook. To enhance the learning environment the use of posters, models and videos may be added.At the conclusion of this workshop, patients will understand the anatomy and physiology of the heart. Additionally, they will understand how Pritikin's three pillars impact the risk factors, the progression, and the management of heart disease.  The purpose of this lesson is to provide a high-level overview of the heart, heart disease, and how the Pritikin  lifestyle positively impacts risk factors.  Exercise Biomechanics Clinical staff led group instruction and group discussion with PowerPoint presentation and patient guidebook. To enhance the learning environment the use of posters, models and videos may be added. Patients will learn how the structural parts of their bodies function and how these functions impact their daily activities, movement, and exercise. Patients will learn how to promote a neutral spine, learn how to manage pain, and identify ways to improve their physical movement in order to promote healthy living. The purpose of this lesson is to expose patients to common physical limitations that impact physical activity. Participants will learn practical ways to adapt and manage aches and pains, and to minimize their effect on regular exercise. Patients will learn how to maintain good posture while sitting, walking, and lifting.  Balance Training and Fall Prevention  Clinical staff led group instruction and group discussion with PowerPoint presentation and patient guidebook. To enhance the learning environment the use of posters, models and videos may be added. At the conclusion of this workshop, patients will understand the importance of their sensorimotor skills (vision, proprioception, and the vestibular system) in maintaining their ability to balance as they age. Patients will apply a variety of balancing exercises that are appropriate for their current level of function. Patients will understand the common causes for poor balance, possible solutions to these problems, and ways to modify their physical environment in order to minimize their fall risk. The purpose of this lesson is to teach patients about the importance of maintaining balance as they age and ways to minimize their risk of falling.  WORKSHOPS   Nutrition:  Fueling a Scientist, research (physical sciences) led group instruction and group discussion with PowerPoint presentation  and patient guidebook. To enhance the learning environment the use of posters, models and videos may be added. Patients will review the foundational principles of the [place] and understand what constitutes a serving size in each of the food groups. Patients will also learn Pritikin-friendly foods that are better choices when away from home and review make-ahead meal and snack options. Calorie density will be reviewed and applied to three nutrition priorities: weight maintenance, weight loss, and weight gain. The purpose of this lesson is to reinforce (in a group setting) the key concepts around what patients are recommended to eat and how to apply these guidelines when away from home by planning and selecting Pritikin-friendly options. Patients will understand how calorie density may be adjusted for different weight management goals.  Mindful Eating  Clinical staff led  group instruction and group discussion with PowerPoint presentation and patient guidebook. To enhance the learning environment the use of posters, models and videos may be added. Patients will briefly review the concepts of the [place] and the importance of low-calorie dense foods. The concept of mindful eating will be introduced as well as the importance of paying attention to internal hunger signals. Triggers for non-hunger eating and techniques for dealing with triggers will be explored. The purpose of this lesson is to provide patients with the opportunity to review the basic principles of the [place], discuss the value of eating mindfully and how to measure internal cues of hunger and fullness using the Hunger Scale. Patients will also discuss reasons for non-hunger eating and learn strategies to use for controlling emotional eating.  Targeting Your Nutrition Priorities Clinical staff led group instruction and group discussion with PowerPoint presentation and patient guidebook. To enhance the  learning environment the use of posters, models and videos may be added. Patients will learn how to determine their genetic susceptibility to disease by reviewing their family history. Patients will gain insight into the importance of diet as part of an overall healthy lifestyle in mitigating the impact of genetics and other environmental insults. The purpose of this lesson is to provide patients with the opportunity to assess their personal nutrition priorities by looking at their family history, their own health history and current risk factors. Patients will also be able to discuss ways of prioritizing and modifying the [place] for their highest risk areas  Menu  Clinical staff led group instruction and group discussion with PowerPoint presentation and patient guidebook. To enhance the learning environment the use of posters, models and videos may be added. Using menus brought in from [employer], or printed from [employer], patients will apply the [place] dining out guidelines that were presented in the [employer] video. Patients will also be able to practice these guidelines in a variety of provided scenarios. The purpose of this lesson is to provide patients with the opportunity to practice hands-on learning of the [place] with actual menus and practice scenarios.  Label Reading Clinical staff led group instruction and group discussion with PowerPoint presentation and patient guidebook. To enhance the learning environment the use of posters, models and videos may be added. Patients will review and discuss the Pritikin label reading guidelines presented in Pritikin's Label Reading Educational series video. Using fool labels brought in from local grocery stores and markets, patients will apply the label reading guidelines and determine if the packaged food meet the Pritikin guidelines. The purpose of this lesson is to provide patients with  the opportunity to review, discuss, and practice hands-on learning of the Pritikin Label Reading guidelines with actual packaged food labels. [place] Workshops are designed to teach patients ways to prepare quick, simple, and affordable recipes at home. The importance of nutrition's role in chronic disease risk reduction is reflected in its emphasis in the overall Pritikin program. By learning how to prepare essential core Pritikin Eating Plan recipes, patients will increase control over what they eat; be able to customize the flavor of foods without the use of added salt, sugar, or fat; and improve the quality of the food they consume. By learning a set of core recipes which are easily assembled, quickly prepared, and affordable, patients are more likely to prepare more healthy foods at home. These workshops focus on convenient breakfasts, simple  entres, side dishes, and desserts which can be prepared with minimal effort and are consistent with nutrition recommendations for cardiovascular risk reduction. Cooking [employer] are taught by a Engineer, materials (RD) who has been trained by the [employer]. The chef or RD has a clear understanding of the importance of minimizing - if not completely eliminating - added fat, sugar, and sodium in recipes. Throughout the series of [place] Workshop sessions, patients will learn about healthy ingredients and efficient methods of cooking to build confidence in their capability to prepare    Cooking School weekly topics:  Adding Flavor- Sodium-Free  Fast and Healthy Breakfasts  Powerhouse Plant-Based Proteins  Satisfying Salads and Dressings  Simple Sides and Sauces  International Cuisine-Spotlight on the [place] Zones  Delicious Desserts  Savory Soups  Efficiency Cooking - Meals in a Snap  Tasty Appetizers and Snacks  Comforting Weekend Breakfasts  One-Pot Wonders   Fast Evening  Meals  Easy [place] (Psychosocial): New Thoughts, New Behaviors Clinical staff led group instruction and group discussion with PowerPoint presentation and patient guidebook. To enhance the learning environment the use of posters, models and videos may be added. Patients will learn and practice techniques for developing effective health and lifestyle goals. Patients will be able to effectively apply the goal setting process learned to develop at least one new personal goal.  The purpose of this lesson is to expose patients to a new skill set of behavior modification techniques such as techniques setting SMART goals, overcoming barriers, and achieving new thoughts and new behaviors.  Managing Moods and Relationships Clinical staff led group instruction and group discussion with PowerPoint presentation and patient guidebook. To enhance the learning environment the use of posters, models and videos may be added. Patients will learn how emotional and chronic stress factors can impact their health and relationships. They will learn healthy ways to manage their moods and utilize positive coping mechanisms. In addition, ICR patients will learn ways to improve communication skills. The purpose of this lesson is to expose patients to ways of understanding how one's mood and health are intimately connected. Developing a healthy outlook can help build positive relationships and connections with others. Patients will understand the importance of utilizing effective communication skills that include actively listening and being heard. They will learn and understand the importance of the "4 Cs" and especially Connections in fostering of a Healthy Mind-Set.  Healthy Sleep for a Healthy Heart Clinical staff led group instruction and group discussion with PowerPoint presentation and patient guidebook. To enhance the learning environment the use of  posters, models and videos may be added. At the conclusion of this workshop, patients will be able to demonstrate knowledge of the importance of sleep to overall health, well-being, and quality of life. They will understand the symptoms of, and treatments for, common sleep disorders. Patients will also be able to identify daytime and nighttime behaviors which impact sleep, and they will be able to apply these tools to help manage sleep-related challenges. The purpose of this lesson is to provide patients with a general overview of sleep and outline the importance of quality sleep. Patients will learn about a few of the most common sleep disorders. Patients will also be introduced to the concept of "sleep hygiene," and discover ways to self-manage certain sleeping problems through simple daily behavior changes. Finally, the workshop will motivate patients by clarifying the links between quality sleep  and their goals of heart-healthy living.   Recognizing and Reducing Stress Clinical staff led group instruction and group discussion with PowerPoint presentation and patient guidebook. To enhance the learning environment the use of posters, models and videos may be added. At the conclusion of this workshop, patients will be able to understand the types of stress reactions, differentiate between acute and chronic stress, and recognize the impact that chronic stress has on their health. They will also be able to apply different coping mechanisms, such as reframing negative self-talk. Patients will have the opportunity to practice a variety of stress management techniques, such as deep abdominal breathing, progressive muscle relaxation, and/or guided imagery.  The purpose of this lesson is to educate patients on the role of stress in their lives and to provide healthy techniques for coping with it.  Learning Barriers/Preferences:  Learning Barriers/Preferences - 09/05/22 1524       Learning Barriers/Preferences    Learning Barriers Sight   wears reading glasses   Learning Preferences Computer/Internet;Written Material             Education Topics:  Knowledge Questionnaire Score:  Knowledge Questionnaire Score - 09/05/22 1525       Knowledge Questionnaire Score   Pre Score 24/24             Core Components/Risk Factors/Patient Goals at Admission:  Personal Goals and Risk Factors at Admission - 09/05/22 1525       Core Components/Risk Factors/Patient Goals on Admission   Lipids Yes    Intervention Provide education and support for participant on nutrition & aerobic/resistive exercise along with prescribed medications to achieve LDL '70mg'$ , HDL >'40mg'$ .    Expected Outcomes Short Term: Participant states understanding of desired cholesterol values and is compliant with medications prescribed. Participant is following exercise prescription and nutrition guidelines.;Long Term: Cholesterol controlled with medications as prescribed, with individualized exercise RX and with personalized nutrition plan. Value goals: LDL < '70mg'$ , HDL > 40 mg.             Core Components/Risk Factors/Patient Goals Review:   Goals and Risk Factor Review     Row Name 09/22/22 1613 10/24/22 1327           Core Components/Risk Factors/Patient Goals Review   Personal Goals Review Lipids Lipids      Review Melquisedec has been doing well with exercise at intensive cardiac rehab and has not has not reported having any chest pain. Resting entry systolic BP in the 06'C, otherewise BP's have been WNL, asymptomatic. Goran continues to do well with exercise and has been able to increase his workloads. Eliaz has not reported having any angina  during exercise. Resting systolic BP's remain in the 90's remains asymptomatic      Expected Outcomes Harry will continue to participate in intensive cardiac rehab for exercise, nutrition and lifestyle modifications Rydge will continue to participate in intensive cardiac rehab for  exercise, nutrition and lifestyle modifications               Core Components/Risk Factors/Patient Goals at Discharge (Final Review):   Goals and Risk Factor Review - 10/24/22 1327       Core Components/Risk Factors/Patient Goals Review   Personal Goals Review Lipids    Review Michelle continues to do well with exercise and has been able to increase his workloads. Sol has not reported having any angina  during exercise. Resting systolic BP's remain in the 90's remains asymptomatic    Expected Outcomes Norval will continue  to participate in intensive cardiac rehab for exercise, nutrition and lifestyle modifications             ITP Comments:  ITP Comments     Row Name 09/05/22 1508 09/11/22 1409 09/22/22 1605 10/24/22 1325     ITP Comments Dr Fransico Him MD, Medical Director. Intorduction to [facility]/ Intensive Cardiac rehab. Initial Orientation Packet Reviewed with the patient 30 Day ITP Review. Phinneas started intensive cardiac rehab on 09/11/22 and did well with exercise. 30 Day ITP Review. Ranard has good attendance and participation in phase 2 cardiac rehab. Duy is off to a good start to exercise. 30 Day ITP Review. Sandor continues to have good attendance and participation in phase 2 cardiac rehab.             Comments: See ITP comments.Harrell Gave RN BSN

## 2022-10-25 ENCOUNTER — Encounter (HOSPITAL_COMMUNITY)
Admission: RE | Admit: 2022-10-25 | Discharge: 2022-10-25 | Disposition: A | Discharge status: Home / Self Care | Payer: PPO | Source: Ambulatory Visit | Attending: Cardiology | Admitting: Cardiology

## 2022-10-25 DIAGNOSIS — Z955 Presence of coronary angioplasty implant and graft: Secondary | ICD-10-CM | POA: Diagnosis not present

## 2022-10-27 ENCOUNTER — Encounter (HOSPITAL_COMMUNITY)
Admission: RE | Admit: 2022-10-27 | Discharge: 2022-10-27 | Disposition: A | Discharge status: Home / Self Care | Payer: PPO | Source: Ambulatory Visit | Attending: Cardiology | Admitting: Cardiology

## 2022-10-27 DIAGNOSIS — Z955 Presence of coronary angioplasty implant and graft: Secondary | ICD-10-CM

## 2022-10-30 ENCOUNTER — Encounter (HOSPITAL_COMMUNITY)
Admission: RE | Admit: 2022-10-30 | Discharge: 2022-10-30 | Disposition: A | Discharge status: Home / Self Care | Payer: PPO | Source: Ambulatory Visit | Attending: Cardiology | Admitting: Cardiology

## 2022-10-30 DIAGNOSIS — Z955 Presence of coronary angioplasty implant and graft: Secondary | ICD-10-CM | POA: Diagnosis not present

## 2022-11-01 ENCOUNTER — Encounter (HOSPITAL_COMMUNITY)
Admission: RE | Admit: 2022-11-01 | Discharge: 2022-11-01 | Disposition: A | Discharge status: Home / Self Care | Payer: PPO | Source: Ambulatory Visit | Attending: Cardiology | Admitting: Cardiology

## 2022-11-01 ENCOUNTER — Other Ambulatory Visit (HOSPITAL_COMMUNITY): Payer: Self-pay

## 2022-11-01 DIAGNOSIS — Z955 Presence of coronary angioplasty implant and graft: Secondary | ICD-10-CM | POA: Diagnosis not present

## 2022-11-03 ENCOUNTER — Encounter (HOSPITAL_COMMUNITY)
Admission: RE | Admit: 2022-11-03 | Discharge: 2022-11-03 | Disposition: A | Discharge status: Home / Self Care | Payer: PPO | Source: Ambulatory Visit | Attending: Cardiology | Admitting: Cardiology

## 2022-11-03 DIAGNOSIS — Z955 Presence of coronary angioplasty implant and graft: Secondary | ICD-10-CM | POA: Diagnosis not present

## 2022-11-06 ENCOUNTER — Ambulatory Visit (HOSPITAL_COMMUNITY): Payer: PPO

## 2022-11-08 ENCOUNTER — Encounter (HOSPITAL_COMMUNITY)
Admission: RE | Admit: 2022-11-08 | Discharge: 2022-11-08 | Disposition: A | Discharge status: Home / Self Care | Payer: PPO | Source: Ambulatory Visit | Attending: Cardiology | Admitting: Cardiology

## 2022-11-08 VITALS — Ht 70.5 in | Wt 166.7 lb

## 2022-11-08 DIAGNOSIS — Z955 Presence of coronary angioplasty implant and graft: Secondary | ICD-10-CM

## 2022-11-09 ENCOUNTER — Other Ambulatory Visit: Payer: Self-pay

## 2022-11-09 ENCOUNTER — Other Ambulatory Visit (HOSPITAL_COMMUNITY): Payer: Self-pay

## 2022-11-10 ENCOUNTER — Ambulatory Visit (HOSPITAL_COMMUNITY): Payer: PPO

## 2022-11-10 ENCOUNTER — Encounter (HOSPITAL_COMMUNITY)
Admission: RE | Admit: 2022-11-10 | Discharge: 2022-11-10 | Disposition: A | Discharge status: Home / Self Care | Payer: PPO | Source: Ambulatory Visit | Attending: Cardiology | Admitting: Cardiology

## 2022-11-10 ENCOUNTER — Encounter (HOSPITAL_COMMUNITY): Payer: PPO

## 2022-11-10 DIAGNOSIS — Z955 Presence of coronary angioplasty implant and graft: Secondary | ICD-10-CM | POA: Diagnosis not present

## 2022-11-13 ENCOUNTER — Encounter (HOSPITAL_COMMUNITY)
Admission: RE | Admit: 2022-11-13 | Discharge: 2022-11-13 | Disposition: A | Discharge status: Home / Self Care | Payer: PPO | Source: Ambulatory Visit | Attending: Cardiology | Admitting: Cardiology

## 2022-11-13 DIAGNOSIS — Z955 Presence of coronary angioplasty implant and graft: Secondary | ICD-10-CM

## 2022-11-15 ENCOUNTER — Encounter (HOSPITAL_COMMUNITY): Payer: PPO

## 2022-11-15 ENCOUNTER — Encounter (HOSPITAL_COMMUNITY)
Admission: RE | Admit: 2022-11-15 | Discharge: 2022-11-15 | Disposition: A | Discharge status: Home / Self Care | Payer: PPO | Source: Ambulatory Visit | Attending: Cardiology | Admitting: Cardiology

## 2022-11-15 DIAGNOSIS — Z955 Presence of coronary angioplasty implant and graft: Secondary | ICD-10-CM | POA: Diagnosis not present

## 2022-11-17 ENCOUNTER — Encounter (HOSPITAL_COMMUNITY)
Admission: RE | Admit: 2022-11-17 | Discharge: 2022-11-17 | Disposition: A | Discharge status: Home / Self Care | Payer: PPO | Source: Ambulatory Visit | Attending: Cardiology | Admitting: Cardiology

## 2022-11-17 DIAGNOSIS — Z955 Presence of coronary angioplasty implant and graft: Secondary | ICD-10-CM

## 2022-11-17 NOTE — Progress Notes (Signed)
Discharge Progress Report  Patient Details  Name: Mark Fitzgerald MRN: [id number] Date of Birth: [birth year] Referring Provider:   Flowsheet Row INTENSIVE CARDIAC REHAB ORIENT from 09/05/2022 in [facility]  Referring Provider Mark Kaufman, MD        Number of Visits: 23  Reason for Discharge:  Patient reached a stable level of exercise. Patient independent in their exercise. Patient has met program and personal goals.  Smoking History:  Social History   Tobacco Use  Smoking Status Never  Smokeless Tobacco Never    Diagnosis:  06/05/22 S/P DES Prox/Mid LAD Intravascular Lithotripsy  ADL UCSD:   Initial Exercise Prescription:  Initial Exercise Prescription - 09/05/22 1500       Date of Initial Exercise RX and Referring Provider   Date 09/05/22    Referring Provider Mark Kaufman, MD    Expected Discharge Date 11/03/22      Arm/Foot Ergometer   Level 2    Watts 50    Minutes 15    METs 4.1      Biostep-RELP   Level 2    SPM 60    Minutes 15    METs 4.1      Prescription Details   Frequency (times per week) 3    Duration Progress to 30 minutes of continuous aerobic without signs/symptoms of physical distress      Intensity   THRR 40-80% of Max Heartrate 61-122    Ratings of Perceived Exertion 11-13    Perceived Dyspnea 0-4      Progression   Progression Continue progressive overload as per policy without signs/symptoms or physical distress.      Resistance Training   Training Prescription Yes    Weight 4 lbs    Reps 10-15             Discharge Exercise Prescription (Final Exercise Prescription Changes):  Exercise Prescription Changes - 11/17/22 1600       Response to Exercise   Blood Pressure (Admit) 96/60    Blood Pressure (Exercise) 120/70    Blood Pressure (Exit) 94/60    Heart Rate (Admit) 71 bpm    Heart Rate (Exercise) 102 bpm    Heart Rate (Exit) 80 bpm     Rating of Perceived Exertion (Exercise) 11    Symptoms None    Comments Pt graduated from the CRP2 program    Duration Continue with 30 min of aerobic exercise without signs/symptoms of physical distress.    Intensity THRR unchanged      Progression   Progression Continue to progress workloads to maintain intensity without signs/symptoms of physical distress.    Average METs 5.2      Resistance Training   Training Prescription Yes    Weight 7 lbs    Reps 10-15    Time 10 Minutes      Interval Training   Interval Training No      Arm Ergometer   Level 3.5    Watts 28    Minutes 15    METs 3.8      Biostep-RELP   Level 7    Minutes 15    METs 6.5      Home Exercise Plan   Plans to continue exercise at [employer] (comment)    Frequency Add 3 additional days to program exercise sessions.    Initial Home Exercises Provided 09/27/22  Functional Capacity:  6 Minute Walk     Row Name 09/05/22 1153 11/08/22 1054       6 Minute Walk   Phase Initial Discharge    Distance 1884 feet 1920 feet    Distance % Change -- 1.75 %    Distance Feet Change -- 36 ft    Walk Time 6 minutes 6 minutes    # of Rest Breaks 0 0    MPH 3.57 3.64    METS 4.18 4.19    RPE 7 11    Perceived Dyspnea  0 0    VO2 Peak 14.6 14.7    Symptoms No No    Resting HR 68 bpm 71 bpm    Resting BP 104/70 98/60    Resting Oxygen Saturation  98 % --    Exercise Oxygen Saturation  during 6 min walk 100 % --    Max Ex. HR 87 bpm 92 bpm    Max Ex. BP 128/70 112/60    2 Minute Post BP 108/64 --             Psychological, QOL, Others - Outcomes: PHQ 2/9:    12/08/2022    4:37 PM 09/11/2022    1:32 PM 09/05/2022    3:11 PM  Depression screen PHQ 2/9  Decreased Interest 0 0 0  Down, Depressed, Hopeless 0 0 0  PHQ - 2 Score 0 0 0    Quality of Life:  Quality of Life - 11/15/22 1113       Quality of Life   Select Quality of Life      Quality of Life Scores    Health/Function Pre 24.8 %    Health/Function Post 28.63 %    Health/Function % Change 15.44 %    Socioeconomic Pre 28.31 %    Socioeconomic Post 30 %    Socioeconomic % Change  5.97 %    Psych/Spiritual Pre 28.29 %    Psych/Spiritual Post 30 %    Psych/Spiritual % Change 6.04 %    Family Pre 29.5 %    Family Post 30 %    Family % Change 1.69 %    GLOBAL Pre 26.97 %    GLOBAL Post 29.4 %    GLOBAL % Change 9.01 %             Personal Goals: Goals established at orientation with interventions provided to work toward goal.  Personal Goals and Risk Factors at Admission - 09/05/22 1525       Core Components/Risk Factors/Patient Goals on Admission   Lipids Yes    Intervention Provide education and support for participant on nutrition & aerobic/resistive exercise along with prescribed medications to achieve LDL <41m, HDL >443m    Expected Outcomes Short Term: Participant states understanding of desired cholesterol values and is compliant with medications prescribed. Participant is following exercise prescription and nutrition guidelines.;Long Term: Cholesterol controlled with medications as prescribed, with individualized exercise RX and with personalized nutrition plan. Value goals: LDL < 706mHDL > 40 mg.              Personal Goals Discharge:  Goals and Risk Factor Review     Row Name 09/22/22 1613 10/24/22 1327           Core Components/Risk Factors/Patient Goals Review   Personal Goals Review Lipids Lipids      Review Mark Fitzgerald been doing well with exercise at intensive cardiac rehab and has not has not  reported having any chest pain. Resting entry systolic BP in the [id number], otherewise BP's have been WNL, asymptomatic. Mark Fitzgerald continues to do well with exercise and has been able to increase his workloads. Mark Fitzgerald has not reported having any angina  during exercise. Resting systolic BP's remain in the 90's remains asymptomatic      Expected Outcomes Mark Fitzgerald will continue  to participate in intensive cardiac rehab for exercise, nutrition and lifestyle modifications Mark Fitzgerald will continue to participate in intensive cardiac rehab for exercise, nutrition and lifestyle modifications               Exercise Goals and Review:  Exercise Goals     Row Name 09/05/22 1507             Exercise Goals   Increase Physical Activity Yes       Intervention Provide advice, education, support and counseling about physical activity/exercise needs.;Develop an individualized exercise prescription for aerobic and resistive training based on initial evaluation findings, risk stratification, comorbidities and participant's personal goals.       Expected Outcomes Short Term: Attend rehab on a regular basis to increase amount of physical activity.;Long Term: Add in home exercise to make exercise part of routine and to increase amount of physical activity.;Long Term: Exercising regularly at least 3-5 days a week.       Increase Strength and Stamina Yes       Intervention Provide advice, education, support and counseling about physical activity/exercise needs.;Develop an individualized exercise prescription for aerobic and resistive training based on initial evaluation findings, risk stratification, comorbidities and participant's personal goals.       Expected Outcomes Short Term: Increase workloads from initial exercise prescription for resistance, speed, and METs.;Short Term: Perform resistance training exercises routinely during rehab and add in resistance training at home;Long Term: Improve cardiorespiratory fitness, muscular endurance and strength as measured by increased METs and functional capacity (6MWT)       Able to understand and use rate of perceived exertion (RPE) scale Yes       Intervention Provide education and explanation on how to use RPE scale       Expected Outcomes Short Term: Able to use RPE daily in rehab to express subjective intensity level;Long Term:  Able to use  RPE to guide intensity level when exercising independently       Knowledge and understanding of Target Heart Rate Range (THRR) Yes       Intervention Provide education and explanation of THRR including how the numbers were predicted and where they are located for reference       Expected Outcomes Short Term: Able to use daily as guideline for intensity in rehab;Short Term: Able to state/look up THRR;Long Term: Able to use THRR to govern intensity when exercising independently       Understanding of Exercise Prescription Yes       Intervention Provide education, explanation, and written materials on patient's individual exercise prescription       Expected Outcomes Short Term: Able to explain program exercise prescription;Long Term: Able to explain home exercise prescription to exercise independently                Exercise Goals Re-Evaluation:  Exercise Goals Re-Evaluation     Row Name 09/11/22 1434 10/06/22 1102 11/01/22 1430 11/17/22 1622       Exercise Goal Re-Evaluation   Exercise Goals Review Increase Physical Activity;Able to understand and use rate of perceived exertion (RPE) scale;Increase Strength and Stamina;Knowledge and  understanding of Target Heart Rate Range (THRR);Understanding of Exercise Prescription Increase Physical Activity;Able to understand and use rate of perceived exertion (RPE) scale;Increase Strength and Stamina;Knowledge and understanding of Target Heart Rate Range (THRR);Understanding of Exercise Prescription Increase Physical Activity;Able to understand and use rate of perceived exertion (RPE) scale;Increase Strength and Stamina;Knowledge and understanding of Target Heart Rate Range (THRR);Understanding of Exercise Prescription Increase Physical Activity;Able to understand and use rate of perceived exertion (RPE) scale;Increase Strength and Stamina;Knowledge and understanding of Target Heart Rate Range (THRR);Understanding of Exercise Prescription;Able to check pulse  independently    Comments Pt's first day in the CRP2 program. Pt understands the exercise Rx, RPE scale, THRR. Reviewed METs and goals. Pt  had peak METs of 4.5 today. Pt voices that regards to his goals of increased strength and stamina, and increased endurance, he is improving. He also voices improvement with his flexibility. Pt still continues to exercise at the gym and pt is exceeding the 150 minutes of exercise per week. Reviewed METs and goals. Pt  had peak METs of 6.1 today. Pt voices that in regards to his goals of increased strength and stamina, and increased endurance, he is improving. Pt still continues to exercise at the gym 3x/week and pt is exceeding 150 minutes of exercise per week; Pt uses the Elliptial for 30 minutes and light weights. Pt graduated from the [place] program. Pt had made good progress and had peak METs of 6.5. Pt has been exercising at his gym 3x/week in addtion to the [place] program. He will continue to exercise at his gym 5-7 days per week doing the elliptical stepper, treadmill and weights.    Expected Outcomes Will continue to monitor patient and progresss exercise workloads as tolerated. Will continue to monitor patient and progresss exercise workloads as tolerated. Will continue to monitor patient and progresss exercise workloads as tolerated. pt will continue to exercise at his gym.             Nutrition & Weight - Outcomes:  Pre Biometrics - 09/05/22 1015       Pre Biometrics   Waist Circumference 37.25 inches    Hip Circumference 39.5 inches    Waist to Hip Ratio 0.94 %    Triceps Skinfold 7 mm    % Body Fat 21.6 %    Grip Strength 36 kg    Flexibility 0 in   cannot reach   Single Leg Stand 4 seconds             Post Biometrics - 11/08/22 1056        Post  Biometrics   Height 5' 10.5" (1.791 m)    Weight 75.6 kg    Waist Circumference 36.5 inches    Hip Circumference 39.5 inches    Waist to Hip Ratio 0.92 %    BMI (Calculated) 23.57     Triceps Skinfold 7 mm    % Body Fat 20.9 %    Grip Strength 38 kg    Flexibility 10.25 in    Single Leg Stand 7.43 seconds             Nutrition:  Nutrition Therapy & Goals - 11/10/22 1020       Nutrition Therapy   Diet Heart Healthy Diet    Drug/Food Interactions Statins/Certain Fruits      Personal Nutrition Goals   Nutrition Goal Patient to increase strategies for managing cardiovascular risk by attending the [employer] education and nutrition courses    Personal Goal #  2 Patient to use the plate method as guide for meal planning to include lean protein/plant protein, vegetables, fruit, whole grains, and nonfat dairy as part of a well balanced diet    Personal Goal #3 Patient to identify food sources and limit daily intake of saturated fat, trans fat, sodium, and refined carbohydrates    Comments Goals in action. Jarule continues to attend the [employer] series. He has started making many dietary changes including reduced sodium intake, reading food labels for sodium/sugar, and reduced frequency of eating out/high fat meals. He continues to prioritize high fiber intake from fruits, vegetables, and whole grains. His wife remains very supportive of lifestyle changes.      Intervention Plan   Intervention Prescribe, educate and counsel regarding individualized specific dietary modifications aiming towards targeted core components such as weight, hypertension, lipid management, diabetes, heart failure and other comorbidities.;Nutrition handout(s) given to patient.    Expected Outcomes Short Term Goal: Understand basic principles of dietary content, such as calories, fat, sodium, cholesterol and nutrients.;Long Term Goal: Adherence to prescribed nutrition plan.             Nutrition Discharge:  Nutrition Assessments - 11/29/22 1008       Rate Your Plate Scores   Post Score 81             Education Questionnaire Score:  Knowledge Questionnaire Score - 11/15/22  1109       Knowledge Questionnaire Score   Post Score 23/24             Goals reviewed with patient; copy given to patient.Pt graduates from  Intensive/Traditional cardiac rehab program today with completion of  48 exercise and education sessions. Pt maintained good attendance and progressed nicely during their participation in rehab as evidenced by increased MET level.   Medication list reconciled. Repeat  PHQ score- 0 .  Pt has made significant lifestyle changes and should be commended for their success. Zayvon achieved their goals during cardiac rehab.   Pt plans to continue exercise at the gym,3-4 days a week, playing tennis 2 days a week and playing pickle ball. Nick has made dietary changes and endorses eating a healthier diet.Domanick says he pays attention to food label. We are proud of [place] progress!Harrell Gave RN BSN

## 2022-11-21 ENCOUNTER — Telehealth: Payer: Self-pay | Admitting: Cardiology

## 2022-11-21 ENCOUNTER — Encounter: Payer: Self-pay | Admitting: Cardiology

## 2022-11-21 DIAGNOSIS — R52 Pain, unspecified: Secondary | ICD-10-CM | POA: Diagnosis not present

## 2022-11-21 DIAGNOSIS — U071 COVID-19: Secondary | ICD-10-CM | POA: Diagnosis not present

## 2022-11-21 DIAGNOSIS — Z6823 Body mass index (BMI) 23.0-23.9, adult: Secondary | ICD-10-CM | POA: Diagnosis not present

## 2022-11-21 NOTE — Telephone Encounter (Signed)
Patient calling to see if it okay for him to take with is other heart medication Paxlovid. Please advise

## 2022-11-21 NOTE — Telephone Encounter (Signed)
Will forward this message to our PharmD team for further advisement.   Will follow-up with the pt accordingly thereafter.

## 2022-11-22 NOTE — Telephone Encounter (Signed)
Purl, Claytor - 11/21/2022  4:58 PM Anda Latina, [facility]  Sent: Wed November 22, 2022  7:38 AM  To: Nuala Alpha, LPN         Message  There is drug- drug interaction between Paxlovid and his current medications (Ranolazine, tamsulosin, ticagrelor rosuvastatin)  suggest to avoid using Paxlovid. There is alternative to Paxlovid molnupiravir which does not have many drug-drug interaction.    Pt made aware of PharmD's recommendations via mychart message.

## 2022-12-04 ENCOUNTER — Other Ambulatory Visit (HOSPITAL_COMMUNITY): Payer: Self-pay

## 2022-12-04 ENCOUNTER — Other Ambulatory Visit: Payer: Self-pay

## 2022-12-05 ENCOUNTER — Other Ambulatory Visit: Payer: Self-pay

## 2022-12-11 ENCOUNTER — Telehealth: Payer: Self-pay | Admitting: Internal Medicine

## 2022-12-11 NOTE — Telephone Encounter (Signed)
Pt scheduled to see Vicie Mutters PA 12/13/22 at 1:30pm. Pt aware of appt.

## 2022-12-11 NOTE — Telephone Encounter (Signed)
Inbound call from patient stating that he is having issues with hemorrhoids and is requesting a call back to discuss. Please advise.

## 2022-12-12 NOTE — Progress Notes (Unsigned)
12/13/2022 KO KRIDER [id number] 04/08/1956  Referring provider: Burnard Bunting, MD Primary GI doctor: Dr. Henrene Pastor  ASSESSMENT AND PLAN:   Rectal itching with non specific rash Normal colon 2022, recall 10 years, no hemorrhoids seen Rectal exam with some skin thickening/white rash Will treat like possible candidiasis infection however also looks like it may be perianal dermatology issue/lichen planus, if not improving with clotrimazole will refer to derm for biopsy Continue sitz baths, continue tucks, can use Desitin   Patient Care Team: Burnard Bunting, MD as PCP - General (Internal Medicine) Freada Bergeron, MD as PCP - Cardiology (Cardiology)  HISTORY OF PRESENT ILLNESS: 67 y.o. male with a past medical history of CAD Aug 2023 with unstable angina on Ranexa, nitroglycerin as needed, Brilinta, hyperlipidemia and others listed below presents for evaluation of hemorrhoids.   02/10/2021 colonoscopy Dr. Henrene Pastor for screening purposes, previous negative colonoscopy 2005, 2011 normal colonoscopy, no specimens collected, recall 10 years  States in last 8-9 months has had hemorrhoids.  Has rectal itching/burning, worse at night. No rectal pain. No hematochezia, no melena. Has BM once a day, occ twice, no changes in Bm's, no straining, no diarrhea.  He was given topical hydrocortisone that helps some. Has been doing epson salt baths in last month. Using tucks after wiping to help with itching.  No AB pain, no nausea, vomiting, no weight loss, no rashes.  Has had new medications with CAD x DES and  3 daughters.  He  reports that he has never smoked. He has never used smokeless tobacco. He reports current alcohol use. He reports that he does not use drugs.  RELEVANT LABS AND IMAGING: CBC    Component Value Date/Time   WBC 5.6 08/10/2022 0911   RBC 4.36 08/10/2022 0911   HGB 14.3 08/10/2022 0911   HCT 42.8 08/10/2022 0911   PLT 211 08/10/2022 0911   MCV 98.2 08/10/2022  0911   MCH 32.8 08/10/2022 0911   MCHC 33.4 08/10/2022 0911   RDW 12.9 08/10/2022 0911   Recent Labs    06/02/22 0846 06/03/22 0254 06/04/22 0213 06/05/22 0636 06/06/22 0331 08/10/22 0911  HGB 15.9 14.2 14.3 14.0 14.2 14.3     CMP     Component Value Date/Time   NA 140 08/10/2022 0911   K 4.1 08/10/2022 0911   CL 105 08/10/2022 0911   CO2 25 08/10/2022 0911   GLUCOSE 99 08/10/2022 0911   BUN 14 08/10/2022 0911   CREATININE 1.14 08/10/2022 0911   CALCIUM 9.6 08/10/2022 0911   PROT 6.8 07/26/2022 0952   ALBUMIN 4.6 07/26/2022 0952   AST 32 07/26/2022 0952   ALT 33 07/26/2022 0952   ALKPHOS 62 07/26/2022 0952   BILITOT 0.6 07/26/2022 0952   GFRNONAA >60 08/10/2022 0911      Latest Ref Rng & Units 07/26/2022    9:52 AM 06/02/2022    8:47 PM  Hepatic Function  Total Protein 6.0 - 8.5 g/dL 6.8  6.2   Albumin 3.9 - 4.9 g/dL 4.6  3.6   AST 0 - 40 IU/L 32  25   ALT 0 - 44 IU/L 33  19   Alk Phosphatase 44 - 121 IU/L 62  40   Total Bilirubin 0.0 - 1.2 mg/dL 0.6  0.9   Bilirubin, Direct 0.00 - 0.40 mg/dL 0.22  <0.1       Current Medications:    Current Outpatient Medications (Cardiovascular):    nitroGLYCERIN (NITROSTAT) 0.4 MG SL tablet,  Place 1 tablet (0.4 mg total) under the tongue every 5 (five) minutes as needed for chest pain. For up to 3 doses   ranolazine (RANEXA) 500 MG 12 hr tablet, Take 1 tablet (500 mg total) by mouth 2 (two) times daily.   rosuvastatin (CRESTOR) 40 MG tablet, Take 1 tablet (40 mg total) by mouth daily.   Current Outpatient Medications (Analgesics):    aspirin EC 81 MG tablet, Take 1 tablet (81 mg total) by mouth daily. Swallow whole.  Current Outpatient Medications (Hematological):    cyanocobalamin (VITAMIN B12) 500 MCG tablet, Take 500 mcg by mouth daily.   ticagrelor (BRILINTA) 90 MG TABS tablet, Take 1 tablet (90 mg total) by mouth 2 (two) times daily.  Current Outpatient Medications (Other):    clotrimazole-betamethasone  (LOTRISONE) cream, Apply 1 Application topically 2 (two) times daily.   Multiple Vitamins-Minerals (MULTIVITAMIN WITH MINERALS) tablet, Take 1 tablet by mouth daily.   tamsulosin (FLOMAX) 0.4 MG CAPS capsule, Take 0.4 mg by mouth daily.  Medical History:  Past Medical History:  Diagnosis Date   CAD (coronary artery disease)    s/p LAD stent 05/2022   Healthy adult on routine physical examination    Hyperlipidemia LDL goal <70    Allergies: No Known Allergies   Surgical History:  He  has a past surgical history that includes Colonoscopy; bicep surgery (Right); Wrist surgery (Right); Back surgery; LEFT HEART CATH AND CORONARY ANGIOGRAPHY (N/A, 06/05/2022); CORONARY STENT INTERVENTION (N/A, 06/05/2022); CORONARY LITHOTRIPSY (N/A, 06/05/2022); and Intravascular Ultrasound/IVUS (N/A, 06/05/2022). Family History:  His family history includes Cancer in his mother; Heart attack in his brother; Pancreatic cancer in his father.  REVIEW OF SYSTEMS  : All other systems reviewed and negative except where noted in the History of Present Illness.  PHYSICAL EXAM: BP 118/80   Pulse 82   Ht 5' 11"$  (1.803 m)   Wt 166 lb (75.3 kg)   SpO2 97%   BMI 23.15 kg/m  General Appearance: Well nourished, in no apparent distress. Head:   Normocephalic and atraumatic. Eyes:  sclerae anicteric,conjunctive pink  Respiratory: Respiratory effort normal, BS equal bilaterally without rales, rhonchi, wheezing. Cardio: RRR with no MRGs. Peripheral pulses intact.  Abdomen: Soft,  Obese ,active bowel sounds. No tenderness . Marland Kitchen No masses. Rectal: External exam with thickened external rash/erythema with white plaque, uncomfortable to palpation,  normal rectal tone, no internal hemorrhoids appreciated, no masses, non tender, brown stool, hemoccult Negative Musculoskeletal: Full ROM, Normal gait. Without edema. Skin:  Dry and intact without significant lesions or rashes Neuro: Alert and  oriented x4;  No focal deficits. Psych:   Cooperative. Normal mood and affect.      Vladimir Crofts, PA-C 2:19 PM

## 2022-12-13 ENCOUNTER — Other Ambulatory Visit (HOSPITAL_COMMUNITY): Payer: Self-pay

## 2022-12-13 ENCOUNTER — Encounter: Payer: Self-pay | Admitting: Physician Assistant

## 2022-12-13 ENCOUNTER — Ambulatory Visit (INDEPENDENT_AMBULATORY_CARE_PROVIDER_SITE_OTHER): Payer: PPO | Admitting: Physician Assistant

## 2022-12-13 VITALS — BP 118/80 | HR 82 | Ht 71.0 in | Wt 166.0 lb

## 2022-12-13 DIAGNOSIS — R21 Rash and other nonspecific skin eruption: Secondary | ICD-10-CM

## 2022-12-13 DIAGNOSIS — L290 Pruritus ani: Secondary | ICD-10-CM | POA: Diagnosis not present

## 2022-12-13 MED ORDER — CLOTRIMAZOLE-BETAMETHASONE 1-0.05 % EX CREA
1.0000 | TOPICAL_CREAM | Freq: Two times a day (BID) | CUTANEOUS | 2 refills | Status: DC
  Filled 2022-12-13: qty 15, 8d supply, fill #0

## 2022-12-13 NOTE — Patient Instructions (Addendum)
Pruritus Ani  Citrucel fiber one tablespoon once daily.  Avoid coffee, alcohol, tomatoes, citrus fruits.  Use un-medicated , moist, flushable toilet wipe ("toddler wipe") at first after bowel movement, then blot dry with soft cloth instead of toilet paper.    A small amount of Zinc Oxide (desitin, balmex) cream can be applied afterwards to protect the sensitive skin near the anal canal.   _______________________________________________________  If your blood pressure at your visit was 140/90 or greater, please contact your primary care physician to follow up on this.  _______________________________________________________  If you are age 31 or older, your body mass index should be between 23-30. Your Body mass index is 23.15 kg/m. If this is out of the aforementioned range listed, please consider follow up with your Primary Care Provider.  If you are age 13 or younger, your body mass index should be between 19-25. Your Body mass index is 23.15 kg/m. If this is out of the aformentioned range listed, please consider follow up with your Primary Care Provider.   ________________________________________________________  The [place] [facility] providers would like to encourage you to use [facility] to communicate with providers for non-urgent requests or questions.  Due to long hold times on the telephone, sending your provider a message by [facility] may be a faster and more efficient way to get a response.  Please allow 48 business hours for a response.  Please remember that this is for non-urgent requests.  _______________________________________________________  Thank you for trusting me with your gastrointestinal care!    Vicie Mutters

## 2022-12-15 NOTE — Progress Notes (Signed)
Assessment and plans noted ?

## 2023-01-02 ENCOUNTER — Ambulatory Visit (INDEPENDENT_AMBULATORY_CARE_PROVIDER_SITE_OTHER): Payer: PPO | Admitting: Internal Medicine

## 2023-01-02 ENCOUNTER — Encounter: Payer: Self-pay | Admitting: Internal Medicine

## 2023-01-02 VITALS — BP 118/68 | HR 60 | Ht 71.0 in | Wt 165.0 lb

## 2023-01-02 DIAGNOSIS — R21 Rash and other nonspecific skin eruption: Secondary | ICD-10-CM

## 2023-01-02 DIAGNOSIS — L290 Pruritus ani: Secondary | ICD-10-CM

## 2023-01-02 NOTE — Progress Notes (Signed)
HISTORY OF PRESENT ILLNESS:  Mark Fitzgerald is a 67 y.o. male who I have seen for screening colonoscopy 2005, 2011, and 2022.  Normal exams.  He presents today for office follow-up after being seen by the GI physician assistant for perianal burning and itching on December 13, 2022.  He was diagnosed with perianal fungal rash and treated with clotrimazole cream.  Presents today for follow-up.  He is delighted to report that his symptoms have resolved entirely.  No active complaints.  REVIEW OF SYSTEMS:  All non-GI ROS negative   Past Medical History:  Diagnosis Date   CAD (coronary artery disease)    s/p LAD stent 05/2022   Healthy adult on routine physical examination    Hyperlipidemia LDL goal <70     Past Surgical History:  Procedure Laterality Date   BACK SURGERY     bicep surgery Right    shoulder   COLONOSCOPY     CORONARY LITHOTRIPSY N/A 06/05/2022   Procedure: CORONARY LITHOTRIPSY;  Surgeon: Belva Crome, MD;  Location: [place] [facility];  Service: Cardiovascular;  Laterality: N/A;   CORONARY STENT INTERVENTION N/A 06/05/2022   Procedure: CORONARY STENT INTERVENTION;  Surgeon: Belva Crome, MD;  Location: [place] [facility];  Service: Cardiovascular;  Laterality: N/A;   INTRAVASCULAR ULTRASOUND/IVUS N/A 06/05/2022   Procedure: Intravascular Ultrasound/IVUS;  Surgeon: Belva Crome, MD;  Location: [place] [facility];  Service: Cardiovascular;  Laterality: N/A;   LEFT HEART CATH AND CORONARY ANGIOGRAPHY N/A 06/05/2022   Procedure: LEFT HEART CATH AND CORONARY ANGIOGRAPHY;  Surgeon: Belva Crome, MD;  Location: [place] [facility];  Service: Cardiovascular;  Laterality: N/A;   WRIST SURGERY Right     Social History ELMOR BONFANTE  reports that he has never smoked. He has never used smokeless tobacco. He reports current alcohol use. He reports that he does not use drugs.  family history includes Cancer in his mother; Heart attack in his brother; Pancreatic cancer in his  father.  No Known Allergies     PHYSICAL EXAMINATION: Vital signs: BP 118/68   Pulse 60   Ht '5\' 11"'$  (1.803 m)   Wt 165 lb (74.8 kg)   SpO2 97%   BMI 23.01 kg/m  General: Well-developed, well-nourished, no acute distress HEENT: Anicteric Abdomen: Not examined. Extremities: No abnormalities Psychiatric: alert and oriented x3. Cooperative   ASSESSMENT:  1.  Cutaneous fungal infection in the perirectal area.  Resolved with clotrimazole cream 2.  Multiple screening colonoscopies, normal.   PLAN:  1.  Antifungal cream on demand as needed 2.  Screening colonoscopy 2032 3.  Interval follow-up as needed.  Return to the care of Dr. Reynaldo Minium for general medical needs

## 2023-01-02 NOTE — Patient Instructions (Signed)
_______________________________________________________  If your blood pressure at your visit was 140/90 or greater, please contact your primary care physician to follow up on this.  _______________________________________________________  If you are age 69 or older, your body mass index should be between 23-30. Your Body mass index is 23.01 kg/m. If this is out of the aforementioned range listed, please consider follow up with your Primary Care Provider.  If you are age 66 or younger, your body mass index should be between 19-25. Your Body mass index is 23.01 kg/m. If this is out of the aformentioned range listed, please consider follow up with your Primary Care Provider.   ________________________________________________________  The [facility] providers would like to encourage you to use [facility] to communicate with providers for non-urgent requests or questions.  Due to long hold times on the telephone, sending your provider a message by [facility] may be a faster and more efficient way to get a response.  Please allow 48 business hours for a response.  Please remember that this is for non-urgent requests.  _______________________________________________________  Please follow up as needed

## 2023-01-10 DIAGNOSIS — Z79899 Other long term (current) drug therapy: Secondary | ICD-10-CM | POA: Diagnosis not present

## 2023-01-10 DIAGNOSIS — Z125 Encounter for screening for malignant neoplasm of prostate: Secondary | ICD-10-CM | POA: Diagnosis not present

## 2023-01-10 DIAGNOSIS — N401 Enlarged prostate with lower urinary tract symptoms: Secondary | ICD-10-CM | POA: Diagnosis not present

## 2023-01-10 DIAGNOSIS — R7989 Other specified abnormal findings of blood chemistry: Secondary | ICD-10-CM | POA: Diagnosis not present

## 2023-01-11 DIAGNOSIS — R7989 Other specified abnormal findings of blood chemistry: Secondary | ICD-10-CM | POA: Diagnosis not present

## 2023-01-11 DIAGNOSIS — Z79899 Other long term (current) drug therapy: Secondary | ICD-10-CM | POA: Diagnosis not present

## 2023-01-11 DIAGNOSIS — Z125 Encounter for screening for malignant neoplasm of prostate: Secondary | ICD-10-CM | POA: Diagnosis not present

## 2023-01-11 DIAGNOSIS — N401 Enlarged prostate with lower urinary tract symptoms: Secondary | ICD-10-CM | POA: Diagnosis not present

## 2023-01-15 DIAGNOSIS — R82998 Other abnormal findings in urine: Secondary | ICD-10-CM | POA: Diagnosis not present

## 2023-01-17 DIAGNOSIS — Z0000 Encounter for general adult medical examination without abnormal findings: Secondary | ICD-10-CM | POA: Diagnosis not present

## 2023-01-17 DIAGNOSIS — Z1339 Encounter for screening examination for other mental health and behavioral disorders: Secondary | ICD-10-CM | POA: Diagnosis not present

## 2023-01-17 DIAGNOSIS — N401 Enlarged prostate with lower urinary tract symptoms: Secondary | ICD-10-CM | POA: Diagnosis not present

## 2023-01-17 DIAGNOSIS — R5383 Other fatigue: Secondary | ICD-10-CM | POA: Diagnosis not present

## 2023-01-17 DIAGNOSIS — Z955 Presence of coronary angioplasty implant and graft: Secondary | ICD-10-CM | POA: Diagnosis not present

## 2023-01-17 DIAGNOSIS — Z1331 Encounter for screening for depression: Secondary | ICD-10-CM | POA: Diagnosis not present

## 2023-01-17 DIAGNOSIS — Z23 Encounter for immunization: Secondary | ICD-10-CM | POA: Diagnosis not present

## 2023-02-01 ENCOUNTER — Other Ambulatory Visit: Payer: Self-pay

## 2023-02-01 ENCOUNTER — Emergency Department (HOSPITAL_COMMUNITY): Payer: PPO

## 2023-02-01 ENCOUNTER — Encounter (HOSPITAL_COMMUNITY): Payer: Self-pay

## 2023-02-01 ENCOUNTER — Emergency Department (HOSPITAL_COMMUNITY)
Admission: EM | Admit: 2023-02-01 | Discharge: 2023-02-01 | Disposition: A | Discharge status: Home / Self Care | Payer: PPO | Attending: Emergency Medicine | Admitting: Emergency Medicine

## 2023-02-01 DIAGNOSIS — R072 Precordial pain: Secondary | ICD-10-CM | POA: Diagnosis not present

## 2023-02-01 DIAGNOSIS — I2510 Atherosclerotic heart disease of native coronary artery without angina pectoris: Secondary | ICD-10-CM | POA: Insufficient documentation

## 2023-02-01 DIAGNOSIS — Z7982 Long term (current) use of aspirin: Secondary | ICD-10-CM | POA: Diagnosis not present

## 2023-02-01 DIAGNOSIS — R079 Chest pain, unspecified: Secondary | ICD-10-CM | POA: Diagnosis not present

## 2023-02-01 LAB — TROPONIN I (HIGH SENSITIVITY)
Troponin I (High Sensitivity): 5 ng/L (ref <18)
Troponin I (High Sensitivity): 4 ng/L (ref <18)

## 2023-02-01 LAB — CBC
HCT: 42 % (ref 39.0–52.0)
Hemoglobin: 14.2 g/dL (ref 13.0–17.0)
MCH: 32.9 pg (ref 26.0–34.0)
MCHC: 33.8 g/dL (ref 30.0–36.0)
MCV: 97.4 fL (ref 80.0–100.0)
Platelets: 230 10*3/uL (ref 150–400)
RBC: 4.31 MIL/uL (ref 4.22–5.81)
RDW: 13.7 % (ref 11.5–15.5)
WBC: 6.9 10*3/uL (ref 4.0–10.5)
nRBC: 0 % (ref 0.0–0.2)

## 2023-02-01 LAB — BASIC METABOLIC PANEL WITH GFR
Anion gap: 10 (ref 5–15)
BUN: 13 mg/dL (ref 8–23)
CO2: 21 mmol/L — ABNORMAL LOW (ref 22–32)
Calcium: 9.4 mg/dL (ref 8.9–10.3)
Chloride: 107 mmol/L (ref 98–111)
Creatinine, Ser: 1.18 mg/dL (ref 0.61–1.24)
GFR, Estimated: >60 mL/min
Glucose, Bld: 96 mg/dL (ref 70–99)
Potassium: 4.4 mmol/L (ref 3.5–5.1)
Sodium: 138 mmol/L (ref 135–145)

## 2023-02-01 MED ORDER — NITROGLYCERIN 0.4 MG SL SUBL
0.4000 mg | SUBLINGUAL_TABLET | SUBLINGUAL | 12 refills | Status: AC | PRN
  Filled 2023-02-01: qty 25, 5d supply, fill #0

## 2023-02-01 NOTE — ED Triage Notes (Signed)
Pt c/o left sided chest pain with tingling in left armx2d. Pt denies N/V, SOB

## 2023-02-01 NOTE — Consult Note (Signed)
Cardiology Consultation   Patient ID: Mark LarocheRobert S Fitzgerald MRN: [id number]; DOB: September 24, 1956  Admit date: 02/01/2023 Date of Consult: 02/01/2023  PCP:  Mark Fitzgerald, Richard, Fitzgerald   [facility] Providers Cardiologist:  Mark SpragueHeather E Pemberton, Fitzgerald     Patient Profile:   Mark LarocheRobert S Fitzgerald is a 67 y.o. male with a hx of CAD status post DES to LAD 05/2022, hyperlipidemia who is being seen 02/01/2023 for the evaluation of chest discomfort at the request of Mark Fitzgerald.  History of Present Illness:   Mark Fitzgerald is a 67 year old male with past medical history noted above.  He is followed by Dr. Shari Fitzgerald as an outpatient.  He presented 05/2022 with chest and upper back pain.  Initial enzymes were negative x 2 underwent coronary CTA which was abnormal with FFR suggesting significant stenosis of the LAD.  Underwent cardiac catheterization with heavily calcified proximal/mid LAD stenosis treated with PCI/DES x 1.  Did have slow flow in fusilli disease first diagonal with chest discomfort that resolved by the end of the case.  Placed on DAPT with aspirin/Plavix with recommendations for at least 1 year.  Echo showed LVEF of 60 to 65%, grade 1 diastolic dysfunction, normal RV size and function with no significant valvular disease.  Was seen in the ER 07/2022 with chest pain that occurred while at rest.  Enzymes were negative.  Follow-up visit in the office he continued to have intermittent chest discomfort and started on low-dose Imdur.  At his follow-up on 09/20/2022 he reported increased fatigue, dizziness and low blood pressures.  Imdur was stopped and transition to Ranexa 500 mg twice daily.   He presented back to the ED on 4/11 with complaints of left-sided " electric/shock like chest discomfort" that last only seconds at a time.  States he had about 6-10 episodes today.  Not associated with shortness of breath, diaphoresis, nausea.   Labs in the ED showed sodium 138, potassium 4.4, creatinine 1.1,  high-sensitivity troponin 5, WBC 6.9, hemoglobin 14.2.  Chest x-ray negative.  EKG showed sinus rhythm, 78 bpm, no acute ST/T wave abnormalities.  In talking with patient he reports being very active, playing tennis on a regular basis and does not have anginal symptoms with this activity.  Past Medical History:  Diagnosis Date   CAD (coronary artery disease)    s/p LAD stent 05/2022   Healthy adult on routine physical examination    Hyperlipidemia LDL goal <70     Past Surgical History:  Procedure Laterality Date   BACK SURGERY     bicep surgery Right    shoulder   COLONOSCOPY     CORONARY LITHOTRIPSY N/A 06/05/2022   Procedure: CORONARY LITHOTRIPSY;  Surgeon: Mark Fitzgerald, Mark Fitzgerald, Fitzgerald;  [facility];  Service: Cardiovascular;  Laterality: N/A;   CORONARY STENT INTERVENTION N/A 06/05/2022   Procedure: CORONARY STENT INTERVENTION;  Surgeon: Mark Fitzgerald, Mark Fitzgerald, Fitzgerald;  Location: [facility];  Service: Cardiovascular;  Laterality: N/A;   CORONARY ULTRASOUND/IVUS N/A 06/05/2022   Procedure: Intravascular Ultrasound/IVUS;  Surgeon: Mark Fitzgerald, Mark Fitzgerald, Fitzgerald;  [facility];  Service: Cardiovascular;  Laterality: N/A;   LEFT HEART CATH AND CORONARY ANGIOGRAPHY N/A 06/05/2022   Procedure: LEFT HEART CATH AND CORONARY ANGIOGRAPHY;  Surgeon: Mark Fitzgerald, Mark Fitzgerald, Fitzgerald;  Location: [facility];  Service: Cardiovascular;  Laterality: N/A;   WRIST SURGERY Right      Home Medications:  Prior to Admission medications   Medication Sig Start Date  End Date Taking? Authorizing Provider  aspirin EC 81 MG tablet Take 1 tablet (81 mg total) by mouth daily. Swallow whole. 06/07/22   Mark Albee, Mark Fitzgerald  clotrimazole-betamethasone (LOTRISONE) cream Apply 1 Application topically 2 (two) times daily. 12/13/22   Mark Albee, Mark Fitzgerald  cyanocobalamin (VITAMIN B12) 500 MCG tablet Take 500 mcg by mouth daily.    Mark Fitzgerald  Multiple Vitamins-Minerals (MULTIVITAMIN WITH MINERALS)  tablet Take 1 tablet by mouth daily.    Mark Fitzgerald  nitroGLYCERIN (NITROSTAT) 0.4 MG SL tablet Place 1 tablet (0.4 mg total) under the tongue every 5 (five) minutes as needed for chest pain. For up to 3 doses 06/06/22   Mark Albee, Mark Fitzgerald  ranolazine (RANEXA) 500 MG 12 hr tablet Take 1 tablet (500 mg total) by mouth 2 (two) times daily. 09/20/22   Mark Sprague, Fitzgerald  rosuvastatin (CRESTOR) 40 MG tablet Take 1 tablet (40 mg total) by mouth daily. 06/07/22   Mark Albee, Mark Fitzgerald  tamsulosin (FLOMAX) 0.4 MG CAPS capsule Take 0.4 mg by mouth daily. 04/07/22   Mark Fitzgerald  ticagrelor (BRILINTA) 90 MG TABS tablet Take 1 tablet (90 mg total) by mouth 2 (two) times daily. 06/06/22   Mark Albee, Mark Fitzgerald    Inpatient Medications: Scheduled Meds:  Continuous Infusions:  PRN Meds:   Allergies:   No Known Allergies  Social History:   Social History   Socioeconomic History   Marital status: Married    Spouse name: Not on file   Number of children: Not on file   Years of education: Not on file   Highest education level: Not on file  Occupational History   Not on file  Tobacco Use   Smoking status: Never   Smokeless tobacco: Never  Vaping Use   Vaping Use: Never used  Substance and Sexual Activity   Alcohol use: Yes    Comment: social    Drug use: Never   Sexual activity: Not on file  Other Topics Concern   Not on file  Social History Narrative   Not on file   Social Determinants of Health   Financial Resource Strain: Not on file  Food Insecurity: Not on file  Transportation Needs: Not on file  Physical Activity: Not on file  Stress: Not on file  Social Connections: Not on file  Intimate Partner Violence: Not on file    Family History:    Family History  Problem Relation Age of Onset   Cancer Mother        pancreatic vs esophageal cancer    Pancreatic cancer Father    Heart attack Brother    Colon cancer Neg Hx    Colon  polyps Neg Hx    Esophageal cancer Neg Hx    Stomach cancer Neg Hx    Rectal cancer Neg Hx      ROS:  Please see the history of present illness.   All other ROS reviewed and negative.     Physical Exam/Data:   Vitals:   02/01/23 1545 02/01/23 1546 02/01/23 1600 02/01/23 1615  BP:   116/78   Pulse: (!) 54  63 75  Resp: 10  12 12   Temp:  98.7 F (37.1 C)    TempSrc:      SpO2: 100%  100% 100%  Weight:      Height:       No intake or output data in the 24 hours ending 02/01/23 1715  02/01/2023   11:04 AM 01/02/2023    3:48 PM 12/13/2022    1:23 PM  Last 3 Weights  Weight (lbs) 164 lb 14.5 oz 165 lb 166 lb  Weight (kg) 74.8 kg 74.844 kg 75.297 kg     Body mass index is 23 kg/m.  General:  Well nourished, well developed, in no acute distress HEENT: normal Neck: no JVD Vascular: No carotid bruits; Distal pulses 2+ bilaterally Cardiac:  normal S1, S2; RRR; no murmur  Lungs:  clear to auscultation bilaterally, no wheezing, rhonchi or rales  Abd: soft, nontender, no hepatomegaly  Ext: no edema Musculoskeletal:  No deformities, BUE and BLE strength normal and equal Skin: warm and dry  Neuro:  CNs 2-12 intact, no focal abnormalities noted Psych:  Normal affect   EKG:  The EKG was personally reviewed and demonstrates:   Telemetry:  Telemetry was personally reviewed and demonstrates:  sinus Rhythm  Relevant CV Studies:  Cath: 06/05/2022  CONCLUSIONS: 85% heavily calcified proximal to mid LAD stenosis reduced to less than 10% with TIMI grade III flow using a 34 x 3.0 Onyx postdilated to 3.25 at high pressure.  Plaque modification using intravascular lithotripsy 3.5 x 10 device with 10 treatments applied.  Slow flow in a diffusely diseased first diagonal produced ongoing discomfort at the end of the procedure better resolved by the time the patient left the Cath Lab. 35 to 45% distal left main Arteries otherwise have diffuse luminal irregularities but no focal high-grade  obstruction. Overall normal LV function with EDP 14 mmHg.   RECOMMENDATIONS:   IV nitroglycerin until chest discomfort resolves.  Flow in the diagonal is improving at the time of procedure completion.  Patient had minimal symptoms at discharge from the Cath Lab.  EKG showed no ischemic changes. Aggressive risk factor modification. Dual antiplatelet therapy for at least 12 months.  Consider de-escalation of ticagrelor to Plavix at 6 months in addition to aspirin.  At the end of 12 months, consider clopidogrel monotherapy.  Diagnostic Dominance: Right  Intervention    Echo: 06/03/2022  IMPRESSIONS     1. Left ventricular ejection fraction, by estimation, is 60 to 65%. The  left ventricle has normal function. The left ventricle has no regional  wall motion abnormalities. Left ventricular diastolic parameters are  consistent with Grade I diastolic  dysfunction (impaired relaxation).   2. Right ventricular systolic function is normal. The right ventricular  size is normal. There is normal pulmonary artery systolic pressure. The  estimated right ventricular systolic pressure is 26.0 mmHg.   3. The mitral valve is grossly normal. Trivial mitral valve  regurgitation. No evidence of mitral stenosis.   4. The aortic valve is tricuspid. Aortic valve regurgitation is not  visualized. No aortic stenosis is present.   5. The inferior vena cava is normal in size with greater than 50%  respiratory variability, suggesting right atrial pressure of 3 mmHg.   FINDINGS   Left Ventricle: Left ventricular ejection fraction, by estimation, is 60  to 65%. The left ventricle has normal function. The left ventricle has no  regional wall motion abnormalities. The left ventricular internal cavity  size was normal in size. There is   no left ventricular hypertrophy. Left ventricular diastolic parameters  are consistent with Grade I diastolic dysfunction (impaired relaxation).   Right Ventricle: The  right ventricular size is normal. No increase in  right ventricular wall thickness. Right ventricular systolic function is  normal. There is normal pulmonary artery systolic pressure.  The tricuspid  regurgitant velocity is 2.40 m/s, and   with an assumed right atrial pressure of 3 mmHg, the estimated right  ventricular systolic pressure is 26.0 mmHg.   Left Atrium: Left atrial size was normal in size.   Right Atrium: Right atrial size was normal in size.   Pericardium: There is no evidence of pericardial effusion.   Mitral Valve: The mitral valve is grossly normal. Trivial mitral valve  regurgitation. No evidence of mitral valve stenosis.   Tricuspid Valve: The tricuspid valve is grossly normal. Tricuspid valve  regurgitation is trivial. No evidence of tricuspid stenosis.   Aortic Valve: The aortic valve is tricuspid. Aortic valve regurgitation is  not visualized. No aortic stenosis is present.   Pulmonic Valve: The pulmonic valve was grossly normal. Pulmonic valve  regurgitation is trivial. No evidence of pulmonic stenosis.   Aorta: The aortic root and ascending aorta are structurally normal, with  no evidence of dilitation.   Venous: The inferior vena cava is normal in size with greater than 50%  respiratory variability, suggesting right atrial pressure of 3 mmHg.   IAS/Shunts: The atrial septum is grossly normal.   Laboratory Data:  High Sensitivity Troponin:   Recent Labs  Lab 02/01/23 1110  TROPONINIHS 5     Chemistry Recent Labs  Lab 02/01/23 1110  NA 138  K 4.4  CL 107  CO2 21*  GLUCOSE 96  BUN 13  CREATININE 1.18  CALCIUM 9.4  GFRNONAA >60  ANIONGAP 10    No results for input(s): "PROT", "ALBUMIN", "AST", "ALT", "ALKPHOS", "BILITOT" in the last 168 hours. Lipids No results for input(s): "CHOL", "TRIG", "HDL", "LABVLDL", "LDLCALC", "CHOLHDL" in the last 168 hours.  Hematology Recent Labs  Lab 02/01/23 1110  WBC 6.9  RBC 4.31  HGB 14.2  HCT 42.0   MCV 97.4  MCH 32.9  MCHC 33.8  RDW 13.7  PLT 230   Thyroid No results for input(s): "TSH", "FREET4" in the last 168 hours.  BNPNo results for input(s): "BNP", "PROBNP" in the last 168 hours.  DDimer No results for input(s): "DDIMER" in the last 168 hours.   Radiology/Studies:  DG Chest 2 View  Result Date: 02/01/2023 CLINICAL DATA:  Chest pain EXAM: CHEST - 2 VIEW COMPARISON:  08/10/2022 FINDINGS: The heart size and mediastinal contours are within normal limits. Both lungs are clear. The visualized skeletal structures are unremarkable. IMPRESSION: No active cardiopulmonary disease. Electronically Signed   By: Ernie Avena M.D.   On: 02/01/2023 11:49     Assessment and Plan:   FREDIE RANARD is a 67 y.o. male with a hx of CAD status post DES to LAD 05/2022, hyperlipidemia who is being seen 02/01/2023 for the evaluation of chest discomfort at the request of Dr. Anitra Lauth.  Atypical chest pain CAD s/p prior DES to LAD 05/2022 -- Presented with atypical chest pain, initial high-sensitivity troponin negative.  EKG without ischemic changes. Symptoms are not reminiscent of prior angina. If second troponin negative would plan to discharge.  Have arranged for follow-up in the office.  Plan for outpatient exercise Myoview. -- Continue aspirin, Brilinta, statin, Ranexa  HLD -- Continue statin  For questions or updates, please contact  [facility] Please consult [email or web address] for contact info under    Signed, Laverda Page, NP  02/01/2023 5:15 PM

## 2023-02-01 NOTE — ED Notes (Signed)
Pt awaiting cardiology

## 2023-02-01 NOTE — ED Provider Notes (Signed)
EMERGENCY DEPARTMENT AT [facility] Provider Note   CSN: [id number] Arrival date & time: 02/01/23  1058     History  Chief Complaint  Patient presents with   Chest Pain    Mark Fitzgerald is a 67 y.o. male.  Patient with history of hyperlipidemia, CAD with with LAD stent placement in 08/23 on Brilinta and ASA, unstable angina presents today with complaints of chest pain. He states that same has been intermittent in nature and has been ongoing for the past 2 days without discernable trigger. Pain is sharp and left sided and radiates down his left arm with associated numbness/tingling. Episodes last about 2 minutes and spontaneously resolve. They do not seem to be triggered by exercise or relieved by rest. Last episode was about 15 minutes prior to my evaluation. He is currently asymptomatic. Endorses approximately 6 episodes of same today. He denies any shortness of breath, nausea, or vomiting. Does state that symptoms are similar to when he was admitted in 08/23 and had cath with stent placement. Of note, patient has not taken his nitroglycerin for his pain as he states that he lost it some time ago and has not requested a refill.   The history is provided by the patient. No language interpreter was used.  Chest Pain      Home Medications Prior to Admission medications   Medication Sig Start Date End Date Taking? Authorizing Provider  aspirin EC 81 MG tablet Take 1 tablet (81 mg total) by mouth daily. Swallow whole. 06/07/22   Jonita Albee, PA-C  clotrimazole-betamethasone (LOTRISONE) cream Apply 1 Application topically 2 (two) times daily. 12/13/22   Doree Albee, PA-C  cyanocobalamin (VITAMIN B12) 500 MCG tablet Take 500 mcg by mouth daily.    [provider]  Multiple Vitamins-Minerals (MULTIVITAMIN WITH MINERALS) tablet Take 1 tablet by mouth daily.    [provider]  nitroGLYCERIN (NITROSTAT) 0.4 MG SL tablet Place 1 tablet (0.4 mg  total) under the tongue every 5 (five) minutes as needed for chest pain. For up to 3 doses 06/06/22   Jonita Albee, PA-C  ranolazine (RANEXA) 500 MG 12 hr tablet Take 1 tablet (500 mg total) by mouth 2 (two) times daily. 09/20/22   Meriam Sprague, MD  rosuvastatin (CRESTOR) 40 MG tablet Take 1 tablet (40 mg total) by mouth daily. 06/07/22   Jonita Albee, PA-C  tamsulosin (FLOMAX) 0.4 MG CAPS capsule Take 0.4 mg by mouth daily. 04/07/22   [provider]  ticagrelor (BRILINTA) 90 MG TABS tablet Take 1 tablet (90 mg total) by mouth 2 (two) times daily. 06/06/22   Jonita Albee, PA-C      Allergies    Patient has no known allergies.    Review of Systems   Review of Systems  Cardiovascular:  Positive for chest pain.  All other systems reviewed and are negative.   Physical Exam Updated Vital Signs BP (!) 122/90   Pulse 78   Temp 98 F (36.7 C) (Oral)   Resp 16   Ht 5\' 11"  (1.803 m)   Wt 74.8 kg   SpO2 100%   BMI 23.00 kg/m  Physical Exam Vitals and nursing note reviewed.  Constitutional:      General: He is not in acute distress.    Appearance: Normal appearance. He is normal weight. He is not ill-appearing, toxic-appearing or diaphoretic.  HENT:     Head: Normocephalic and atraumatic.  Cardiovascular:  Rate and Rhythm: Normal rate and regular rhythm.     Pulses:          Radial pulses are 2+ on the right side and 2+ on the left side.     Heart sounds: Normal heart sounds.  Pulmonary:     Effort: Pulmonary effort is normal. No respiratory distress.     Breath sounds: Normal breath sounds.  Abdominal:     Palpations: Abdomen is soft.  Musculoskeletal:        General: Normal range of motion.     Cervical back: Normal range of motion.     Right lower leg: No tenderness. No edema.     Left lower leg: No tenderness. No edema.  Skin:    General: Skin is warm and dry.  Neurological:     General: No focal deficit present.     Mental Status:  He is alert.  Psychiatric:        Mood and Affect: Mood normal.        Behavior: Behavior normal.     ED Results / Procedures / Treatments   Labs (all labs ordered are listed, but only abnormal results are displayed) Labs Reviewed  BASIC METABOLIC PANEL - Abnormal; Notable for the following components:      Result Value   CO2 21 (*)    All other components within normal limits  CBC  TROPONIN I (HIGH SENSITIVITY)  TROPONIN I (HIGH SENSITIVITY)    EKG EKG Interpretation  Date/Time:  Thursday February 01 2023 10:44:27 EDT Ventricular Rate:  78 PR Interval:  172 QRS Duration: 80 QT Interval:  356 QTC Calculation: 405 R Axis:   74 Text Interpretation: Normal sinus rhythm Normal ECG When compared with ECG of 10-Aug-2022 09:05, PREVIOUS ECG IS PRESENT No significant change since last tracing Confirmed by Gwyneth SproutPlunkett, Whitney ([id number]) on 02/01/2023 2:02:23 PM  Radiology DG Chest 2 View  Result Date: 02/01/2023 CLINICAL DATA:  Chest pain EXAM: CHEST - 2 VIEW COMPARISON:  08/10/2022 FINDINGS: The heart size and mediastinal contours are within normal limits. Both lungs are clear. The visualized skeletal structures are unremarkable. IMPRESSION: No active cardiopulmonary disease. Electronically Signed   By: Ernie AvenaPalani  Rathinasamy M.D.   On: 02/01/2023 11:49    Procedures Procedures    Medications Ordered in ED Medications - No data to display  ED Course/ Medical Decision Making/ A&P                             Medical Decision Making Amount and/or Complexity of Data Reviewed Labs: ordered. Radiology: ordered.   This patient is a 67 y.o. male who presents to the ED for concern of chest pain, this involves an extensive number of treatment options, and is a complaint that carries with it a high risk of complications and morbidity. The emergent differential diagnosis prior to evaluation includes, but is not limited to,  ACS, pericarditis, myocarditis, aortic dissection, PE, pneumothorax,  esophageal spasm or rupture, chronic angina, pneumonia, bronchitis, GERD, reflux/PUD, biliary disease, pancreatitis, costochondritis, anxiety  This is not an exhaustive differential.   Past Medical History / Co-morbidities / Social History: history of hyperlipidemia, CAD with with LAD stent placement in 08/23 on Brilinta and ASA, unstable angina  Additional history: Chart reviewed. Pertinent results include: echo in 08/23 shows EF 60-65%. LAD stent placed at that time  Physical Exam: Physical exam performed. The pertinent findings include: no acute physical exam findings  Lab  Tests: I ordered, and personally interpreted labs.  The pertinent results include:  troponin negative and flat. No acute laboratory findings   Imaging Studies: I ordered imaging studies including CXR. I independently visualized and interpreted imaging which showed NAD. I agree with the radiologist interpretation.   Cardiac Monitoring:  The patient was maintained on a cardiac monitor.  My attending physician Dr. Anitra Lauth viewed and interpreted the cardiac monitored which showed an underlying rhythm of: sinus rhythm, no STEMI. I agree with this interpretation.   Consultations Obtained: I requested consultation with the cardiology on call Dr. Eden Emms,  and discussed lab and imaging findings as well as pertinent plan - they recommend: they have seen and independently evaluated the patient and recommend d/c with close outpatient follow up and stress test.   Disposition:  Patient presents today with complaints of intermittent chest pain x 2 days. He is afebrile, non-toxic appearing, and in no acute distress with reassuring vital signs. HEART score 4, therefore cardiology consulted who recommended d/c with close outpatient follow-up. Plan for same. Patient is currently pain free. I have refilled his nitroglycerin per his request. Evaluation and diagnostic testing in the emergency department does not suggest an emergent  condition requiring admission or immediate intervention beyond what has been performed at this time.  Plan for discharge with close PCP follow-up.  Patient is understanding and amenable with plan, educated on red flag symptoms that would prompt immediate return.  Patient discharged in stable condition.  Findings and plan of care discussed with supervising physician Dr. Anitra Lauth who is in agreement.   Final Clinical Impression(s) / ED Diagnoses Final diagnoses:  Precordial chest pain    Rx / DC Orders ED Discharge Orders          Ordered    nitroGLYCERIN (NITROSTAT) 0.4 MG SL tablet  Every 5 min PRN        02/01/23 1820          An After Visit Summary was printed and given to the patient.     Vear Clock 02/01/23 Alfred Levins, MD 02/03/23 [phone]

## 2023-02-01 NOTE — Discharge Instructions (Signed)
As we discussed, your work-up in the ER was reassuring for acute findings. Laboratory evaluation, chest x-ray, and EKG did not show any emergent concerns. Cardiology has seen you and determined that you are cleared to be discharged with close outpatient follow-up and stress test. In the interim, I have refilled your nitroglycerin per your request to help with any pain you may have. I recommend close pcp follow-up as well.   Return if development of any new or worsening symptoms

## 2023-02-01 NOTE — Progress Notes (Signed)
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 [employer]  Has had 3 days of atypical chest pain Sharp fleeting and located On anterior chest, lateral chest/ribs  Pain not like prior angina which had a large component of back pain   Cath 06/05/22 reviewed Nice result with stenting of proximal LAD with plaque  Modification using intravascular lithotripsy by Dr Katrinka Blazing He had no significant Disease in his RCA/LCX  Exam is normal no murmur/rub  No pain to palpation  Clear lungs No edema  ECG is normal with no acute ischemic changes and no PR depression Or signs of pericarditis   Despite sharp pains had continued to play active tennis with no issues  First troponin negative CXR NAD   Ok to d/c home if 2 nd troponin negative PA will arrange exercise myovue next week and patient Has f/u with Dr Shari Prows after that   Charlton Haws MD [facility]

## 2023-02-02 ENCOUNTER — Ambulatory Visit: Payer: PPO | Admitting: Physician Assistant

## 2023-02-02 ENCOUNTER — Other Ambulatory Visit (HOSPITAL_COMMUNITY): Payer: Self-pay

## 2023-02-05 ENCOUNTER — Telehealth: Payer: Self-pay | Admitting: Cardiology

## 2023-02-05 ENCOUNTER — Encounter: Payer: Self-pay | Admitting: *Deleted

## 2023-02-05 DIAGNOSIS — R079 Chest pain, unspecified: Secondary | ICD-10-CM

## 2023-02-05 DIAGNOSIS — R0789 Other chest pain: Secondary | ICD-10-CM

## 2023-02-05 DIAGNOSIS — I2510 Atherosclerotic heart disease of native coronary artery without angina pectoris: Secondary | ICD-10-CM

## 2023-02-05 DIAGNOSIS — Z955 Presence of coronary angioplasty implant and graft: Secondary | ICD-10-CM

## 2023-02-05 NOTE — Telephone Encounter (Signed)
Spoke with Dr. Shari Prows about this.  Per Dr. Shari Prows, order for the pt to get an exercise myoview was placed.  Exercise myoview instructions sent to the pts active mychart account.  Pt made aware of this.  He is aware that our [facility] Scheduling team will reach out to him soon to arrange this test.  Pt verbalized understanding and agrees with this plan.  Will send this encounter back to Dr. Shari Prows to sign off on pended attestation order in this encounter.

## 2023-02-05 NOTE — Telephone Encounter (Signed)
Will send this message to Dr. Shari Prows to further advise on outpatient testing.   Do not see orders but see Dr. Fabio Bering note.  Will follow-up with the pt accordingly thereafter.

## 2023-02-05 NOTE — Telephone Encounter (Signed)
Patient called stating he was in the emergency room last week and he saw Dr. Eden Emms who recommended he have a stress test done.  However, there is no order for a stress test in Epic to schedule patient.

## 2023-02-07 NOTE — Telephone Encounter (Signed)
RE: exercise myoview per Dr. Shari Prows Received: Today Janett Billow, LPN Schedule 1-61-09

## 2023-02-07 NOTE — Telephone Encounter (Signed)
02/06/23 [facility] to schedule @ 10:46/LBW  CONSENT SIGNED

## 2023-02-08 ENCOUNTER — Telehealth (HOSPITAL_COMMUNITY): Payer: Self-pay | Admitting: *Deleted

## 2023-02-08 NOTE — Telephone Encounter (Signed)
Patient given detailed instructions per Myocardial Perfusion Study Information Sheet for the test on 02/13/2023 at 7:30. Patient notified to arrive 15 minutes early and that it is imperative to arrive on time for appointment to keep from having the test rescheduled.  If you need to cancel or reschedule your appointment, please call the office within 24 hours of your appointment. . Patient verbalized understanding.Mark Fitzgerald

## 2023-02-12 ENCOUNTER — Other Ambulatory Visit (HOSPITAL_COMMUNITY): Payer: Self-pay

## 2023-02-12 DIAGNOSIS — N401 Enlarged prostate with lower urinary tract symptoms: Secondary | ICD-10-CM | POA: Diagnosis not present

## 2023-02-12 DIAGNOSIS — I2510 Atherosclerotic heart disease of native coronary artery without angina pectoris: Secondary | ICD-10-CM | POA: Diagnosis not present

## 2023-02-12 DIAGNOSIS — F419 Anxiety disorder, unspecified: Secondary | ICD-10-CM | POA: Diagnosis not present

## 2023-02-12 DIAGNOSIS — Z955 Presence of coronary angioplasty implant and graft: Secondary | ICD-10-CM | POA: Diagnosis not present

## 2023-02-12 MED ORDER — ESCITALOPRAM OXALATE 10 MG PO TABS
10.0000 mg | ORAL_TABLET | Freq: Every day | ORAL | 1 refills | Status: DC
  Filled 2023-02-12: qty 90, 90d supply, fill #0
  Filled 2023-05-06: qty 90, 90d supply, fill #1

## 2023-02-12 MED ORDER — TAMSULOSIN HCL 0.4 MG PO CAPS
0.4000 mg | ORAL_CAPSULE | Freq: Every day | ORAL | 0 refills | Status: DC
  Filled 2023-02-12: qty 90, 90d supply, fill #0

## 2023-02-13 ENCOUNTER — Ambulatory Visit (HOSPITAL_COMMUNITY): Payer: PPO | Attending: Cardiology

## 2023-02-13 DIAGNOSIS — R079 Chest pain, unspecified: Secondary | ICD-10-CM

## 2023-02-13 DIAGNOSIS — R0789 Other chest pain: Secondary | ICD-10-CM | POA: Diagnosis not present

## 2023-02-13 DIAGNOSIS — Z955 Presence of coronary angioplasty implant and graft: Secondary | ICD-10-CM

## 2023-02-13 DIAGNOSIS — I2510 Atherosclerotic heart disease of native coronary artery without angina pectoris: Secondary | ICD-10-CM | POA: Diagnosis not present

## 2023-02-13 LAB — MYOCARDIAL PERFUSION IMAGING
Base ST Depression (mm): 0 mm
Estimated workload: 7.8
Exercise duration (min): 6 min
Exercise duration (sec): 32 s
LV dias vol: 69 mL (ref 62–150)
LV sys vol: 29 mL
MPHR: 151 {beats}/min
Nuc Stress EF: 58 %
Peak HR: 144 {beats}/min
Percent HR: 95 %
Rest HR: 72 {beats}/min
Rest Nuclear Isotope Dose: 10.9 mCi
SDS: 3
SRS: 0
SSS: 3
ST Depression (mm): 3 mm
ST Elevation (mm): 0 mm
Stress Nuclear Isotope Dose: 32.3 mCi
TID: 1.08

## 2023-02-13 MED ORDER — TECHNETIUM TC 99M TETROFOSMIN IV KIT
32.3000 | PACK | Freq: Once | INTRAVENOUS | Status: AC | PRN
  Administered 2023-02-13: 32.3 via INTRAVENOUS

## 2023-02-13 MED ORDER — TECHNETIUM TC 99M TETROFOSMIN IV KIT
10.9000 | PACK | Freq: Once | INTRAVENOUS | Status: AC | PRN
  Administered 2023-02-13: 10.9 via INTRAVENOUS

## 2023-02-13 MED ORDER — REGADENOSON 0.4 MG/5ML IV SOLN: 0.4000 mg | Freq: Once | INTRAVENOUS | Status: DC

## 2023-03-18 NOTE — Progress Notes (Unsigned)
Cardiology Office Note:    Date:  03/21/2023   ID:  Mark Fitzgerald, DOB 06-30-1956, MRN [id number]  PCP:  Geoffry Paradise, MD   [facility] Providers Cardiologist:  Meriam Sprague, MD    Referring MD: Geoffry Paradise, MD   History of Present Illness:    Mark Fitzgerald is a 67 y.o. male with a hx of CAD s/p LAD-DES 06/05/2022 and HLD who presents to clinic for follow-up.  Patient presented to hospital August 2023 for chest pain.  Coronary CT was abnormal.  Underwent cardiac catheterization showing 40% left main and 85% proximal LAD stenosis. LAD was treated with DES + plaque modification using intravascular lithotripsy x 10 treatments. There was slow flow in diffusely disease D1 which produced ongoing CP at the end of procedure, pain improved with nitro SL.  Plan for dual antiplatelet therapy 12 months.  Placed on high intensity statin. During follow-up patient continued to have mild chest discomfort.  Imdur added.   Seen in ER 08/10/22 for chest pain which occurred at rest and was vaguely reminiscent of his previous anginal discomfort, but did not have the typical radiation to the interscapular area that he had before. EKG without acute changes. Trop negative.     Was last seen by Chelsea Aus 08/15/22 where he continued to have intermittent chest discomfort. Lower dose of imdur was added at that time.  Admitted 01/2023 for chest discomfort that was not exertional in nature. Trop negative, ECG nonischemic. Was recommended for outpatient stress testing which was reassuringly normal with no evidence of ischemia or infarction.  Today, the patient is overall feeling well. States his energy level is still on the lower side but is stable to exercise vigorously without issue. No chest pain, SOB, orthopnea, or PND. Blood pressure is continues to be on the lower side in the 100-110 at home. Occasional lightheadedness with changing positions. Not on any blood pressure medication and only  takes flomax in the morning.    Past Medical History:  Diagnosis Date   CAD (coronary artery disease)    s/p LAD stent 05/2022   Healthy adult on routine physical examination    Hyperlipidemia LDL goal <70     Past Surgical History:  Procedure Laterality Date   BACK SURGERY     bicep surgery Right    shoulder   COLONOSCOPY     CORONARY LITHOTRIPSY N/A 06/05/2022   Procedure: CORONARY LITHOTRIPSY;  Surgeon: Lyn Records, MD;  [facility] INVASIVE CV LAB;  Service: Cardiovascular;  Laterality: N/A;   CORONARY STENT INTERVENTION N/A 06/05/2022   Procedure: CORONARY STENT INTERVENTION;  Surgeon: Lyn Records, MD;  Location: [facility] INVASIVE CV LAB;  Service: Cardiovascular;  Laterality: N/A;   CORONARY ULTRASOUND/IVUS N/A 06/05/2022   Procedure: Intravascular Ultrasound/IVUS;  Surgeon: Lyn Records, MD;  Location: [facility] INVASIVE CV LAB;  Service: Cardiovascular;  Laterality: N/A;   LEFT HEART CATH AND CORONARY ANGIOGRAPHY N/A 06/05/2022   Procedure: LEFT HEART CATH AND CORONARY ANGIOGRAPHY;  Surgeon: Lyn Records, MD;  Location: [facility] INVASIVE CV LAB;  Service: Cardiovascular;  Laterality: N/A;   WRIST SURGERY Right     Current Medications: Current Meds  Medication Sig   aspirin EC 81 MG tablet Take 1 tablet (81 mg total) by mouth daily. Swallow whole.   cyanocobalamin (VITAMIN B12) 500 MCG tablet Take 500 mcg by mouth daily.   escitalopram (LEXAPRO) 10 MG tablet Take 1 tablet (10 mg total) by mouth daily.  Multiple Vitamins-Minerals (MULTIVITAMIN WITH MINERALS) tablet Take 1 tablet by mouth daily.   nitroGLYCERIN (NITROSTAT) 0.4 MG SL tablet Place 1 tablet (0.4 mg total) under the tongue every 5 (five) minutes as needed for chest pain. For up to 3 doses   rosuvastatin (CRESTOR) 40 MG tablet Take 1 tablet (40 mg total) by mouth daily.   tamsulosin (FLOMAX) 0.4 MG CAPS capsule Take 0.4 mg by mouth daily.   ticagrelor (BRILINTA) 90 MG TABS tablet Take 1 tablet (90 mg total) by mouth 2 (two)  times daily.   [DISCONTINUED] ranolazine (RANEXA) 500 MG 12 hr tablet Take 1 tablet (500 mg total) by mouth 2 (two) times daily.     Allergies:   Patient has no known allergies.   Social History   Socioeconomic History   Marital status: Married    Spouse name: Not on file   Number of children: Not on file   Years of education: Not on file   Highest education level: Not on file  Occupational History   Not on file  Tobacco Use   Smoking status: Never   Smokeless tobacco: Never  Vaping Use   Vaping Use: Never used  Substance and Sexual Activity   Alcohol use: Yes    Comment: social    Drug use: Never   Sexual activity: Not on file  Other Topics Concern   Not on file  Social History Narrative   Not on file   Social Determinants of Health   Financial Resource Strain: Not on file  Food Insecurity: Not on file  Transportation Needs: Not on file  Physical Activity: Not on file  Stress: Not on file  Social Connections: Not on file     Family History: The patient's family history includes Cancer in his mother; Heart attack in his brother; Pancreatic cancer in his father. There is no history of Colon cancer, Colon polyps, Esophageal cancer, Stomach cancer, or Rectal cancer.  ROS:   As per HPI   EKGs/Labs/Other Studies Reviewed:    The following studies were reviewed today:  Cardiac catheterization 06/05/2022 85% heavily calcified proximal to mid LAD stenosis reduced to less than 10% with TIMI grade III flow using a 34 x 3.0 Onyx postdilated to 3.25 at high pressure.  Plaque modification using intravascular lithotripsy 3.5 x 10 device with 10 treatments applied.  Slow flow in a diffusely diseased first diagonal produced ongoing discomfort at the end of the procedure better resolved by the time the patient left the Cath Lab. 35 to 45% distal left main Arteries otherwise have diffuse luminal irregularities but no focal high-grade obstruction. Overall normal LV function with  EDP 14 mmHg.   RECOMMENDATIONS:   IV nitroglycerin until chest discomfort resolves.  Flow in the diagonal is improving at the time of procedure completion.  Patient had minimal symptoms at discharge from the Cath Lab.  EKG showed no ischemic changes. Aggressive risk factor modification. Dual antiplatelet therapy for at least 12 months.  Consider de-escalation of ticagrelor to Plavix at 6 months in addition to aspirin.  At the end of 12 months, consider clopidogrel monotherapy.   Diagnostic Dominance: Right  Intervention        Echocardiogram 06/03/2022  1. Left ventricular ejection fraction, by estimation, is 60 to 65%. The  left ventricle has normal function. The left ventricle has no regional  wall motion abnormalities. Left ventricular diastolic parameters are  consistent with Grade I diastolic  dysfunction (impaired relaxation).   2. Right ventricular systolic  function is normal. The right ventricular  size is normal. There is normal pulmonary artery systolic pressure. The  estimated right ventricular systolic pressure is 26.0 mmHg.   3. The mitral valve is grossly normal. Trivial mitral valve  regurgitation. No evidence of mitral stenosis.   4. The aortic valve is tricuspid. Aortic valve regurgitation is not  visualized. No aortic stenosis is present.   5. The inferior vena cava is normal in size with greater than 50%  respiratory variability, suggesting right atrial pressure of 3 mmHg.       EKG:  EKG was not ordered today.  Recent Labs: 06/02/2022: TSH 1.529 07/26/2022: ALT 33 02/01/2023: BUN 13; Creatinine, Ser 1.18; Hemoglobin 14.2; Platelets 230; Potassium 4.4; Sodium 138  Recent Lipid Panel    Component Value Date/Time   CHOL 116 07/26/2022 0952   TRIG 66 07/26/2022 0952   HDL 56 07/26/2022 0952   CHOLHDL 2.1 07/26/2022 0952   CHOLHDL 3.2 06/03/2022 0254   VLDL 15 06/03/2022 0254   LDLCALC 46 07/26/2022 0952     Risk Assessment/Calculations:                 Physical Exam:    VS:  BP 96/60   Pulse 73   Ht 5\' 11"  (1.803 m)   Wt 162 lb 9.6 oz (73.8 kg)   SpO2 97%   BMI 22.68 kg/m     Wt Readings from Last 3 Encounters:  03/21/23 162 lb 9.6 oz (73.8 kg)  02/13/23 164 lb (74.4 kg)  02/01/23 164 lb 14.5 oz (74.8 kg)     GEN:  Well nourished, well developed in no acute distress HEENT: Normal NECK: No JVD; No carotid bruits CARDIAC: RRR, no murmurs RESPIRATORY:  Clear to auscultation without rales, wheezing or rhonchi  ABDOMEN: Soft, non-tender, non-distended MUSCULOSKELETAL:  No edema; No deformity  SKIN: Warm and dry NEUROLOGIC:  Alert and oriented x 3 PSYCHIATRIC:  Normal affect   ASSESSMENT:    1. Coronary artery disease involving native heart without angina pectoris, unspecified vessel or lesion type   2. S/P coronary artery stent placement   3. Hyperlipidemia, unspecified hyperlipidemia type    PLAN:    In order of problems listed above:  #CAD s/p PCI to LAD: Cath 05/2022 with 85% heavily calcified prox-mid LAD stenosis s/p intravascular lithrotripsy and DES. Had jailed, small and diffusely diseased D1 which is likely the cause of residual chest pain, which has since significantly improved. Myoview 01/2023 with no ischemia or infarction. Currently, doing well and remains very active -Continue ASA 81mg  daily, brilinta 90mg  BID for planned 1 year -Will transition to plavix monotherapy in 05/2023 -Okay to trial off ranexa and see if any recurrence of chest discomfort and if so, can resume at that time. -Continue crestor 40mg  daily -Could not tolerate BB or imdur due to hypotension -Nitro prn  #HLD: -Continue crestor 40mg  daily -LDL at goal at 46  #Relative Hypotension: Running 90-100s at home with associated fatigue. Not currently on BP meds but is taking flomax in the AM. Will trial holding the flomax and see if BP improves (did not like taking it at night due to nocturia). He will send in a log for [place] to  review. Can re-time med to late afternoon if needed    Medication Adjustments/Labs and Tests Ordered: Current medicines are reviewed at length with the patient today.  Concerns regarding medicines are outlined above.  No orders of the defined types were placed in this  encounter.  No orders of the defined types were placed in this encounter.   Patient Instructions  Medication Instructions:   STOP TAKING RANEXA NOW  HOLD YOUR FLOMAX FOR 3 DAYS AND SEND DR. Talin Feister SOME BLOOD PRESSURE READINGS VIA MYCHART THEREAFTER, TO SEE IF THEY IMPROVE OR NOT WITH HOLD.  *If you need a refill on your cardiac medications before your next appointment, please call your pharmacy*    Follow-Up: At [facility], you and your health needs are our priority.  As part of our continuing mission to provide you with exceptional heart care, we have created designated Provider Care Teams.  These Care Teams include your primary Cardiologist (physician) and Advanced Practice Providers (APPs -  Physician Assistants and Nurse Practitioners) who all work together to provide you with the care you need, when you need it.  We recommend signing up for the patient portal called "MyChart".  Sign up information is provided on this After Visit Summary.  MyChart is used to connect with patients for Virtual Visits (Telemedicine).  Patients are able to view lab/test results, encounter notes, upcoming appointments, etc.  Non-urgent messages can be sent to your provider as well.   To learn more about what you can do with MyChart, go to [email or web address].    Your next appointment:   6 month(s)  Provider:   DR. Gerri Spore O'NEAL   Other Instructions  SEND [place] A MYCHART MESSAGE IN MID TO LATE JULY SO THAT WE CAN ADVISE ON SWITCHING YOU FROM [place] TO [facility] IN [place]       Signed, Meriam Sprague, MD  03/21/2023 9:14 AM    [facility]

## 2023-03-21 ENCOUNTER — Encounter: Payer: Self-pay | Admitting: Cardiology

## 2023-03-21 ENCOUNTER — Ambulatory Visit: Payer: PPO | Attending: Cardiology | Admitting: Cardiology

## 2023-03-21 VITALS — BP 96/60 | HR 73 | Ht 71.0 in | Wt 162.6 lb

## 2023-03-21 DIAGNOSIS — Z955 Presence of coronary angioplasty implant and graft: Secondary | ICD-10-CM

## 2023-03-21 DIAGNOSIS — E785 Hyperlipidemia, unspecified: Secondary | ICD-10-CM | POA: Diagnosis not present

## 2023-03-21 DIAGNOSIS — I2510 Atherosclerotic heart disease of native coronary artery without angina pectoris: Secondary | ICD-10-CM | POA: Diagnosis not present

## 2023-03-21 NOTE — Patient Instructions (Signed)
Medication Instructions:   STOP TAKING RANEXA NOW  HOLD YOUR FLOMAX FOR 3 DAYS AND SEND DR. PEMBERTON SOME BLOOD PRESSURE READINGS VIA MYCHART THEREAFTER, TO SEE IF THEY IMPROVE OR NOT WITH HOLD.  *If you need a refill on your cardiac medications before your next appointment, please call your pharmacy*    Follow-Up: At [facility], you and your health needs are our priority.  As part of our continuing mission to provide you with exceptional heart care, we have created designated Provider Care Teams.  These Care Teams include your primary Cardiologist (physician) and Advanced Practice Providers (APPs -  Physician Assistants and Nurse Practitioners) who all work together to provide you with the care you need, when you need it.  We recommend signing up for the patient portal called "MyChart".  Sign up information is provided on this After Visit Summary.  MyChart is used to connect with patients for Virtual Visits (Telemedicine).  Patients are able to view lab/test results, encounter notes, upcoming appointments, etc.  Non-urgent messages can be sent to your provider as well.   To learn more about what you can do with MyChart, go to [email or web address].    Your next appointment:   6 month(s)  Provider:   DR. Gerri Spore O'NEAL   Other Instructions  SEND [place] A MYCHART MESSAGE IN MID TO [place] JUL[place] SO THAT WE CAN ADVISE ON SWITCHING YOU FROM [place] TO [place] IN [place]

## 2023-03-22 DIAGNOSIS — Z955 Presence of coronary angioplasty implant and graft: Secondary | ICD-10-CM | POA: Diagnosis not present

## 2023-03-22 DIAGNOSIS — F419 Anxiety disorder, unspecified: Secondary | ICD-10-CM | POA: Diagnosis not present

## 2023-03-22 DIAGNOSIS — I2510 Atherosclerotic heart disease of native coronary artery without angina pectoris: Secondary | ICD-10-CM | POA: Diagnosis not present

## 2023-04-10 ENCOUNTER — Encounter: Payer: Self-pay | Admitting: Cardiology

## 2023-05-09 ENCOUNTER — Other Ambulatory Visit (HOSPITAL_COMMUNITY): Payer: Self-pay

## 2023-05-14 ENCOUNTER — Other Ambulatory Visit (HOSPITAL_COMMUNITY): Payer: Self-pay

## 2023-05-14 MED ORDER — TAMSULOSIN HCL 0.4 MG PO CAPS
0.4000 mg | ORAL_CAPSULE | Freq: Every day | ORAL | 0 refills | Status: DC
  Filled 2023-05-14: qty 90, 90d supply, fill #0

## 2023-05-29 ENCOUNTER — Other Ambulatory Visit: Payer: Self-pay | Admitting: Cardiology

## 2023-05-30 ENCOUNTER — Other Ambulatory Visit (HOSPITAL_COMMUNITY): Payer: Self-pay

## 2023-05-30 MED ORDER — ROSUVASTATIN CALCIUM 40 MG PO TABS
40.0000 mg | ORAL_TABLET | Freq: Every day | ORAL | 0 refills | Status: DC
  Filled 2023-05-30: qty 90, 90d supply, fill #0

## 2023-06-10 ENCOUNTER — Other Ambulatory Visit: Payer: Self-pay | Admitting: Cardiology

## 2023-06-12 ENCOUNTER — Other Ambulatory Visit (HOSPITAL_COMMUNITY): Payer: Self-pay

## 2023-06-12 MED ORDER — TICAGRELOR 90 MG PO TABS
90.0000 mg | ORAL_TABLET | Freq: Two times a day (BID) | ORAL | 3 refills | Status: DC
  Filled 2023-06-12: qty 180, 90d supply, fill #0
  Filled 2023-06-13: qty 60, 30d supply, fill #0
  Filled 2023-07-08: qty 60, 30d supply, fill #1

## 2023-06-13 ENCOUNTER — Other Ambulatory Visit (HOSPITAL_COMMUNITY): Payer: Self-pay

## 2023-07-09 ENCOUNTER — Other Ambulatory Visit (HOSPITAL_COMMUNITY): Payer: Self-pay

## 2023-07-10 ENCOUNTER — Encounter: Payer: Self-pay | Admitting: Cardiovascular Disease

## 2023-07-10 ENCOUNTER — Other Ambulatory Visit (HOSPITAL_COMMUNITY): Payer: Self-pay

## 2023-07-11 ENCOUNTER — Other Ambulatory Visit (HOSPITAL_COMMUNITY): Payer: Self-pay

## 2023-07-11 DIAGNOSIS — L812 Freckles: Secondary | ICD-10-CM | POA: Diagnosis not present

## 2023-07-11 DIAGNOSIS — L821 Other seborrheic keratosis: Secondary | ICD-10-CM | POA: Diagnosis not present

## 2023-07-11 DIAGNOSIS — K130 Diseases of lips: Secondary | ICD-10-CM | POA: Diagnosis not present

## 2023-07-11 DIAGNOSIS — D1801 Hemangioma of skin and subcutaneous tissue: Secondary | ICD-10-CM | POA: Diagnosis not present

## 2023-07-11 MED ORDER — HYDROCORTISONE 2.5 % EX CREA
1.0000 | TOPICAL_CREAM | Freq: Every day | CUTANEOUS | 1 refills | Status: DC
  Filled 2023-07-11: qty 30, 10d supply, fill #0
  Filled 2023-11-18: qty 30, 10d supply, fill #1

## 2023-07-11 MED ORDER — KETOCONAZOLE 2 % EX CREA
1.0000 | TOPICAL_CREAM | Freq: Every day | CUTANEOUS | 1 refills | Status: DC
  Filled 2023-07-11: qty 30, 10d supply, fill #0
  Filled 2023-11-18: qty 30, 10d supply, fill #1

## 2023-07-12 ENCOUNTER — Other Ambulatory Visit (HOSPITAL_COMMUNITY): Payer: Self-pay

## 2023-07-12 MED ORDER — CLOPIDOGREL BISULFATE 75 MG PO TABS
75.0000 mg | ORAL_TABLET | Freq: Every day | ORAL | 0 refills | Status: DC
  Filled 2023-07-12: qty 97, 90d supply, fill #0

## 2023-08-05 ENCOUNTER — Other Ambulatory Visit (HOSPITAL_COMMUNITY): Payer: Self-pay

## 2023-08-06 ENCOUNTER — Other Ambulatory Visit (HOSPITAL_COMMUNITY): Payer: Self-pay

## 2023-08-06 MED ORDER — ESCITALOPRAM OXALATE 10 MG PO TABS
10.0000 mg | ORAL_TABLET | Freq: Every day | ORAL | 1 refills | Status: DC
  Filled 2023-08-06: qty 90, 90d supply, fill #0
  Filled 2023-11-05: qty 90, 90d supply, fill #1

## 2023-08-06 MED ORDER — TAMSULOSIN HCL 0.4 MG PO CAPS
0.4000 mg | ORAL_CAPSULE | Freq: Every day | ORAL | 1 refills | Status: DC
  Filled 2023-08-06: qty 90, 90d supply, fill #0
  Filled 2023-11-13: qty 90, 90d supply, fill #1

## 2023-08-27 ENCOUNTER — Other Ambulatory Visit: Payer: Self-pay | Admitting: Cardiology

## 2023-08-28 ENCOUNTER — Other Ambulatory Visit (HOSPITAL_COMMUNITY): Payer: Self-pay

## 2023-08-28 MED ORDER — ROSUVASTATIN CALCIUM 40 MG PO TABS
40.0000 mg | ORAL_TABLET | Freq: Every day | ORAL | 0 refills | Status: DC
  Filled 2023-08-28: qty 90, 90d supply, fill #0

## 2023-08-29 DIAGNOSIS — L820 Inflamed seborrheic keratosis: Secondary | ICD-10-CM | POA: Diagnosis not present

## 2023-09-05 ENCOUNTER — Other Ambulatory Visit (HOSPITAL_COMMUNITY): Payer: Self-pay

## 2023-09-05 MED ORDER — INFLUENZA VAC A&B SURF ANT ADJ 0.5 ML IM SUSY
0.5000 mL | PREFILLED_SYRINGE | Freq: Once | INTRAMUSCULAR | 0 refills | Status: AC
  Filled 2023-09-05: qty 0.5, 1d supply, fill #0

## 2023-09-09 NOTE — Progress Notes (Unsigned)
Cardiology Office Note:  .   Date:  09/10/2023  ID:  Mark Fitzgerald, DOB [birth year], MRN [id number] PCP: Geoffry Paradise, MD  [facility] Providers Cardiologist:  Reatha Harps, MD { History of Present Illness: .   Mark Fitzgerald is a 67 y.o. male with history of CAD and HLD who presents for follow-up. Followed with Dr. Shari Prows.   History of Present Illness   Mark Fitzgerald, a 67 year old male with a history of CAD status post PCI to the LAD in August 2023, complicated by a jailed diagonal branch and hyperlipidemia, presents for a follow-up visit. He was previously under the care of Dr. Shari Prows but has transitioned to our office. He reports intermittent chest pain since the stent placement, but currently, he is asymptomatic. His blood pressure is noted to be low today, but he denies any symptoms of dizziness or lightheadedness, except for occasional episodes when he wakes up at night to urinate. He is not on any blood pressure medication but takes Flomax, which could potentially lower his blood pressure. He is also on Plavix, B12, Lexapro, and Crestor 40mg  daily. His LDL cholesterol in March was 82, which is at goal. He has completed a heart rehab program and maintains an active lifestyle, playing tennis and pickleball several times a week, and going to the gym. He denies any history of smoking and consumes alcohol socially. He has no other significant surgical history. His family history is significant for cancer, but not heart disease. No chest pain or SOB reported today.          Problem List CAD -PCI to LAD 05/2022 -jailed small D1 2. HLD -T chol 123, HDL 58, LDL 52, TG 67    ROS: All other ROS reviewed and negative. Pertinent positives noted in the HPI.     Studies Reviewed: Marland Kitchen        LHC 06/05/2022 85% heavily calcified proximal to mid LAD stenosis reduced to less than 10% with TIMI grade III flow using a 34 x 3.0 Onyx postdilated to 3.25 at high pressure.  Plaque  modification using intravascular lithotripsy 3.5 x 10 device with 10 treatments applied.  Slow flow in a diffusely diseased first diagonal produced ongoing discomfort at the end of the procedure better resolved by the time the patient left the Cath Lab. 35 to 45% distal left main Arteries otherwise have diffuse luminal irregularities but no focal high-grade obstruction. Overall normal LV function with EDP 14 mmHg. Physical Exam:   VS:  BP (!) 80/56   Pulse 77   Ht 5\' 11"  (1.803 m)   Wt 168 lb (76.2 kg)   SpO2 99%   BMI 23.43 kg/m    Wt Readings from Last 3 Encounters:  09/10/23 168 lb (76.2 kg)  03/21/23 162 lb 9.6 oz (73.8 kg)  02/13/23 164 lb (74.4 kg)    GEN: Well nourished, well developed in no acute distress NECK: No JVD; No carotid bruits CARDIAC: RRR, no murmurs, rubs, gallops RESPIRATORY:  Clear to auscultation without rales, wheezing or rhonchi  ABDOMEN: Soft, non-tender, non-distended EXTREMITIES:  No edema; No deformity  ASSESSMENT AND PLAN: .   Assessment and Plan    Coronary Artery Disease (CAD) Status post PCI to LAD in August 2023 with residual left main disease. No current symptoms of chest pain or trouble breathing. LDL cholesterol at goal (52) on Crestor 40mg  daily. -Continue Plavix indefinitely. -Continue Crestor 40mg  daily.  Hypotension Blood pressure slightly low, possibly due to Flomax.  No symptoms of dizziness or lightheadedness. -Continue current regimen, monitor symptoms.  Follow-up in one year unless symptoms arise.            Follow-up: Return in about 1 year (around 09/09/2024).  Time Spent with Patient: I have spent a total of 25 minutes caring for this patient today face to face, ordering and reviewing labs/tests, reviewing prior records/medical history, examining the patient, establishing an assessment and plan, communicating results/findings to the patient/family, and documenting in the medical record.   Signed, Lenna Gilford. Flora Lipps, MD,  [facility]  [facility]  [address] [place], [place] [address] [phone]  8:51 AM

## 2023-09-10 ENCOUNTER — Ambulatory Visit: Payer: PPO | Attending: Cardiovascular Disease | Admitting: Cardiovascular Disease

## 2023-09-10 ENCOUNTER — Encounter: Payer: Self-pay | Admitting: Cardiovascular Disease

## 2023-09-10 VITALS — BP 80/56 | HR 77 | Ht 71.0 in | Wt 168.0 lb

## 2023-09-10 DIAGNOSIS — I25118 Atherosclerotic heart disease of native coronary artery with other forms of angina pectoris: Secondary | ICD-10-CM | POA: Diagnosis not present

## 2023-09-10 DIAGNOSIS — E782 Mixed hyperlipidemia: Secondary | ICD-10-CM | POA: Diagnosis not present

## 2023-09-10 NOTE — Patient Instructions (Addendum)
Medication Instructions:  No changes    *If you need a refill on your cardiac medications before your next appointment, please call your pharmacy*   Lab Work: None    If you have labs (blood work) drawn today and your tests are completely normal, you will receive your results only by: MyChart Message (if you have MyChart) OR A paper copy in the mail If you have any lab test that is abnormal or we need to change your treatment, we will call you to review the results.   Testing/Procedures: None    Follow-Up: At [facility], you and your health needs are our priority.  As part of our continuing mission to provide you with exceptional heart care, we have created designated Provider Care Teams.  These Care Teams include your primary Cardiologist (physician) and Advanced Practice Providers (APPs -  Physician Assistants and Nurse Practitioners) who all work together to provide you with the care you need, when you need it.  We recommend signing up for the patient portal called "MyChart".  Sign up information is provided on this After Visit Summary.  MyChart is used to connect with patients for Virtual Visits (Telemedicine).  Patients are able to view lab/test results, encounter notes, upcoming appointments, etc.  Non-urgent messages can be sent to your provider as well.   To learn more about what you can do with MyChart, go to [email or web address].    Your next appointment:   1 year(s)  The format for your next appointment:   In Person  Provider:   Reatha Harps, MD    Other Instructions

## 2023-10-12 ENCOUNTER — Other Ambulatory Visit: Payer: Self-pay | Admitting: Cardiovascular Disease

## 2023-10-12 ENCOUNTER — Other Ambulatory Visit (HOSPITAL_COMMUNITY): Payer: Self-pay

## 2023-10-12 MED ORDER — CLOPIDOGREL BISULFATE 75 MG PO TABS
75.0000 mg | ORAL_TABLET | Freq: Every day | ORAL | 3 refills | Status: DC
  Filled 2023-10-12: qty 90, 90d supply, fill #0
  Filled 2024-01-14: qty 90, 90d supply, fill #1
  Filled 2024-04-11: qty 90, 90d supply, fill #2
  Filled 2024-07-09: qty 90, 90d supply, fill #3

## 2023-11-15 ENCOUNTER — Other Ambulatory Visit (HOSPITAL_COMMUNITY): Payer: Self-pay

## 2023-11-18 ENCOUNTER — Other Ambulatory Visit (HOSPITAL_COMMUNITY): Payer: Self-pay

## 2023-11-19 ENCOUNTER — Other Ambulatory Visit: Payer: Self-pay

## 2023-11-19 ENCOUNTER — Other Ambulatory Visit (HOSPITAL_COMMUNITY): Payer: Self-pay

## 2023-11-19 MED ORDER — ESCITALOPRAM OXALATE 10 MG PO TABS
10.0000 mg | ORAL_TABLET | Freq: Every day | ORAL | 1 refills | Status: DC
  Filled 2023-11-19 – 2024-02-10 (×2): qty 90, 90d supply, fill #0
  Filled 2024-05-05: qty 90, 90d supply, fill #1

## 2023-11-28 ENCOUNTER — Other Ambulatory Visit: Payer: Self-pay | Admitting: Cardiology

## 2023-11-28 ENCOUNTER — Other Ambulatory Visit (HOSPITAL_COMMUNITY): Payer: Self-pay

## 2023-11-28 MED ORDER — ROSUVASTATIN CALCIUM 40 MG PO TABS
40.0000 mg | ORAL_TABLET | Freq: Every day | ORAL | 3 refills | Status: AC
  Filled 2023-11-28: qty 90, 90d supply, fill #0
  Filled 2024-03-03: qty 90, 90d supply, fill #1
  Filled 2024-05-30: qty 90, 90d supply, fill #2
  Filled 2024-09-01: qty 90, 90d supply, fill #3

## 2024-01-15 DIAGNOSIS — I2510 Atherosclerotic heart disease of native coronary artery without angina pectoris: Secondary | ICD-10-CM | POA: Diagnosis not present

## 2024-01-15 DIAGNOSIS — N401 Enlarged prostate with lower urinary tract symptoms: Secondary | ICD-10-CM | POA: Diagnosis not present

## 2024-01-15 DIAGNOSIS — Z79899 Other long term (current) drug therapy: Secondary | ICD-10-CM | POA: Diagnosis not present

## 2024-01-15 DIAGNOSIS — Z1212 Encounter for screening for malignant neoplasm of rectum: Secondary | ICD-10-CM | POA: Diagnosis not present

## 2024-01-22 DIAGNOSIS — R82998 Other abnormal findings in urine: Secondary | ICD-10-CM | POA: Diagnosis not present

## 2024-01-23 ENCOUNTER — Other Ambulatory Visit (HOSPITAL_COMMUNITY): Payer: Self-pay

## 2024-01-23 DIAGNOSIS — N401 Enlarged prostate with lower urinary tract symptoms: Secondary | ICD-10-CM | POA: Diagnosis not present

## 2024-01-23 DIAGNOSIS — I2510 Atherosclerotic heart disease of native coronary artery without angina pectoris: Secondary | ICD-10-CM | POA: Diagnosis not present

## 2024-01-23 DIAGNOSIS — M1990 Unspecified osteoarthritis, unspecified site: Secondary | ICD-10-CM | POA: Diagnosis not present

## 2024-01-23 DIAGNOSIS — F329 Major depressive disorder, single episode, unspecified: Secondary | ICD-10-CM | POA: Diagnosis not present

## 2024-01-23 DIAGNOSIS — Z0000 Encounter for general adult medical examination without abnormal findings: Secondary | ICD-10-CM | POA: Diagnosis not present

## 2024-01-23 DIAGNOSIS — Z1331 Encounter for screening for depression: Secondary | ICD-10-CM | POA: Diagnosis not present

## 2024-01-23 DIAGNOSIS — Z1339 Encounter for screening examination for other mental health and behavioral disorders: Secondary | ICD-10-CM | POA: Diagnosis not present

## 2024-01-23 DIAGNOSIS — F419 Anxiety disorder, unspecified: Secondary | ICD-10-CM | POA: Diagnosis not present

## 2024-01-23 MED ORDER — CLOTRIMAZOLE-BETAMETHASONE 1-0.05 % EX CREA
1.0000 | TOPICAL_CREAM | Freq: Two times a day (BID) | CUTANEOUS | 3 refills | Status: DC
Start: 2024-01-23 — End: 2024-09-15
  Filled 2024-01-23: qty 60, 30d supply, fill #0

## 2024-01-28 ENCOUNTER — Other Ambulatory Visit (HOSPITAL_COMMUNITY): Payer: Self-pay

## 2024-01-28 MED ORDER — TAMSULOSIN HCL 0.4 MG PO CAPS
0.4000 mg | ORAL_CAPSULE | Freq: Every day | ORAL | 1 refills | Status: DC
  Filled 2024-01-28: qty 90, 90d supply, fill #0
  Filled 2024-05-05: qty 90, 90d supply, fill #1

## 2024-02-01 ENCOUNTER — Other Ambulatory Visit (HOSPITAL_COMMUNITY): Payer: Self-pay

## 2024-02-11 ENCOUNTER — Other Ambulatory Visit (HOSPITAL_COMMUNITY): Payer: Self-pay

## 2024-02-18 DIAGNOSIS — H43813 Vitreous degeneration, bilateral: Secondary | ICD-10-CM | POA: Diagnosis not present

## 2024-06-24 DIAGNOSIS — S63621A Sprain of interphalangeal joint of right thumb, initial encounter: Secondary | ICD-10-CM | POA: Diagnosis not present

## 2024-08-03 ENCOUNTER — Other Ambulatory Visit (HOSPITAL_COMMUNITY): Payer: Self-pay

## 2024-08-04 ENCOUNTER — Other Ambulatory Visit (HOSPITAL_COMMUNITY): Payer: Self-pay

## 2024-08-04 MED ORDER — TAMSULOSIN HCL 0.4 MG PO CAPS
0.4000 mg | ORAL_CAPSULE | Freq: Every day | ORAL | 1 refills | Status: AC
  Filled 2024-08-04: qty 90, 90d supply, fill #0
  Filled 2024-11-03: qty 90, 90d supply, fill #1

## 2024-08-04 MED ORDER — ESCITALOPRAM OXALATE 10 MG PO TABS
10.0000 mg | ORAL_TABLET | Freq: Every day | ORAL | 3 refills | Status: AC
  Filled 2024-08-04: qty 90, 90d supply, fill #0
  Filled 2024-11-03: qty 90, 90d supply, fill #1

## 2024-08-25 DIAGNOSIS — H43813 Vitreous degeneration, bilateral: Secondary | ICD-10-CM | POA: Diagnosis not present

## 2024-09-14 NOTE — Progress Notes (Unsigned)
 Cardiology Office Note:  .   Date:  09/15/2024  ID:  Mark Fitzgerald, DOB 1956/04/30, MRN [id number] PCP: Shepard Ade, MD  [facility] Providers Cardiologist:  Darryle ONEIDA Decent, MD   History of Present Illness: .    Chief Complaint  Patient presents with   Follow-up    Mark Fitzgerald is a 68 y.o. male with history of CAD who presents for follow-up.    History of Present Illness   Mark Fitzgerald is a 68 year old male with coronary artery disease who presents for follow-up after PCI to the LAD in August 2023.  He underwent PCI to the LAD in August 2023. He feels good overall with no chest pain or dyspnea. He remains active, engaging in activities such as pickleball, tennis, and gym workouts almost daily without limitations.  He is currently on Plavix  and Crestor  40 mg. There are no bleeding issues while on Plavix . His most recent LDL was 62. He has not experienced symptoms of angina and has only used nitroglycerin  once early on post-procedure, which he attributes possibly to psychological reasons. He keeps nitroglycerin  in his tennis bag as a precaution.  In terms of social history, he is active in sports and exercises regularly. He plans to spend Thanksgiving in [place] with his children and grandchildren.  No chest pain, breathing difficulties, or bleeding issues. Not diabetic and has no significant limitations in his physical activities.          Problem List CAD -PCI to LAD 05/2022 -jailed small D1 2. HLD -T chol 142, HDL 55, LDL 62, TG 124    ROS: All other ROS reviewed and negative. Pertinent positives noted in the HPI.     Studies Reviewed: SABRA   EKG Interpretation Date/Time:  Monday September 15 2024 14:57:14 EST Ventricular Rate:  71 PR Interval:  166 QRS Duration:  68 QT Interval:  376 QTC Calculation: 408 R Axis:   76  Text Interpretation: Normal sinus rhythm Normal ECG Confirmed by Decent Darryle [id number]) on 09/15/2024 3:02:20 PM   LHC  06/05/2022 CONCLUSIONS: 85% heavily calcified proximal to mid LAD stenosis reduced to less than 10% with TIMI grade III flow using a 34 x 3.0 Onyx postdilated to 3.25 at high pressure.  Plaque modification using intravascular lithotripsy 3.5 x 10 device with 10 treatments applied.  Slow flow in a diffusely diseased first diagonal produced ongoing discomfort at the end of the procedure better resolved by the time the patient left the Cath Lab. 35 to 45% distal left main Arteries otherwise have diffuse luminal irregularities but no focal high-grade obstruction. Overall normal LV function with EDP 14 mmHg.  NM Stress 02/13/2023   The study is normal. The study is low risk.   3.0 mm of horizontal ST depression in the inferolateral leads (II, III, aVF, V5 and V6) was noted. False positive ETT   LV perfusion is normal. There is no evidence of ischemia. There is no evidence of infarction.   Left ventricular function is normal. Nuclear stress EF: 58 %. The left ventricular ejection fraction is normal (55-65%). End diastolic cavity size is normal. End systolic cavity size is normal.   Physical Exam:   VS:  BP 114/72   Pulse 76   Ht 5' 11 (1.803 m)   Wt 174 lb (78.9 kg)   SpO2 97%   BMI 24.27 kg/m    Wt Readings from Last 3 Encounters:  09/15/24 174 lb (78.9 kg)  09/10/23 168  lb (76.2 kg)  03/21/23 162 lb 9.6 oz (73.8 kg)    GEN: Well nourished, well developed in no acute distress NECK: No JVD; No carotid bruits CARDIAC: RRR, no murmurs, rubs, gallops RESPIRATORY:  Clear to auscultation without rales, wheezing or rhonchi  ABDOMEN: Soft, non-tender, non-distended EXTREMITIES:  No edema; No deformity  ASSESSMENT AND PLAN: .   Assessment and Plan    Atherosclerotic heart disease of native coronary artery, post-PCI Status post PCI to the LAD in August 2023. Asymptomatic with no ischemia on stress test. Jailed small diagonal branch not clinically significant. - Continue Plavix  as prescribed. -  Advised to avoid routine NSAID use due to bleeding risk with stent. - Encouraged continued physical activity and healthy lifestyle. - Scheduled follow-up in one year unless symptoms change.  Mixed hyperlipidemia Cholesterol levels well-controlled with Crestor  40 mg. LDL at 62, indicating excellent control. - Continue Crestor  40 mg daily.              Follow-up: Return in about 1 year (around 09/15/2025).  Signed, Darryle DASEN. Barbaraann, MD, [facility]  [facility]  [address] [place], [place] [address] [phone]  3:13 PM

## 2024-09-15 ENCOUNTER — Ambulatory Visit (HOSPITAL_BASED_OUTPATIENT_CLINIC_OR_DEPARTMENT_OTHER): Admitting: Cardiovascular Disease

## 2024-09-15 ENCOUNTER — Encounter (HOSPITAL_BASED_OUTPATIENT_CLINIC_OR_DEPARTMENT_OTHER): Payer: Self-pay | Admitting: Cardiovascular Disease

## 2024-09-15 VITALS — BP 114/72 | HR 76 | Ht 71.0 in | Wt 174.0 lb

## 2024-09-15 DIAGNOSIS — E782 Mixed hyperlipidemia: Secondary | ICD-10-CM

## 2024-09-15 DIAGNOSIS — I25118 Atherosclerotic heart disease of native coronary artery with other forms of angina pectoris: Secondary | ICD-10-CM | POA: Diagnosis not present

## 2024-09-15 NOTE — Patient Instructions (Signed)
 Medication Instructions:  Your physician recommends that you continue on your current medications as directed. Please refer to the Current Medication list given to you today. *If you need a refill on your cardiac medications before your next appointment, please call your pharmacy*  Lab Work: NONE  Testing/Procedures: NONE  Follow-Up: At [facility], you and your health needs are our priority.  As part of our continuing mission to provide you with exceptional heart care, our providers are all part of one team.  This team includes your primary Cardiologist (physician) and Advanced Practice Providers or APPs (Physician Assistants and Nurse Practitioners) who all work together to provide you with the care you need, when you need it.  Your next appointment:   12 month(s)  Provider:   Dr Barbaraann or One of our Advanced Practice Providers (APPs): Morse Clause, PA-C  Lamarr Satterfield, NP Miriam Shams, NP  Olivia Pavy, PA-C Josefa Beauvais, NP  Leontine Salen, PA-C Orren Fabry, PA-C  Hao Meng, PA-C Ernest Dick, NP  Damien Braver, NP Jon Hails, PA-C  Waddell Donath, PA-C    Dayna Dunn, PA-C  Scott Weaver, PA-C Lum Louis, NP Katlyn West, NP Callie Goodrich, PA-C  Xika Zhao, NP Sheng Haley, PA-C    Kathleen Johnson, PA-C   We recommend signing up for the patient portal called MyChart.  Sign up information is provided on this After Visit Summary.  MyChart is used to connect with patients for Virtual Visits (Telemedicine).  Patients are able to view lab/test results, encounter notes, upcoming appointments, etc.  Non-urgent messages can be sent to your provider as well.   To learn more about what you can do with MyChart, go to [email or web address].

## 2024-10-17 ENCOUNTER — Ambulatory Visit: Admitting: Cardiovascular Disease

## 2024-10-21 ENCOUNTER — Other Ambulatory Visit (HOSPITAL_COMMUNITY): Payer: Self-pay

## 2024-10-21 ENCOUNTER — Other Ambulatory Visit: Payer: Self-pay | Admitting: Cardiovascular Disease

## 2024-10-21 MED ORDER — CLOPIDOGREL BISULFATE 75 MG PO TABS
75.0000 mg | ORAL_TABLET | Freq: Every day | ORAL | 3 refills | Status: AC
  Filled 2024-10-21: qty 90, 90d supply, fill #0

## 2024-10-29 ENCOUNTER — Ambulatory Visit: Admitting: Cardiovascular Disease
